# Patient Record
Sex: Male | Born: 1935
Health system: Southern US, Community
[De-identification: ages and names within clinical notes are randomized; demographics above are authoritative.]

## PROBLEM LIST (undated history)

## (undated) DIAGNOSIS — E119 Type 2 diabetes mellitus without complications: Secondary | ICD-10-CM

## (undated) DIAGNOSIS — I509 Heart failure, unspecified: Secondary | ICD-10-CM

## (undated) DIAGNOSIS — N183 Chronic kidney disease, stage 3 unspecified: Secondary | ICD-10-CM

## (undated) DIAGNOSIS — I10 Essential (primary) hypertension: Secondary | ICD-10-CM

## (undated) DIAGNOSIS — E785 Hyperlipidemia, unspecified: Secondary | ICD-10-CM

## (undated) DIAGNOSIS — Z85828 Personal history of other malignant neoplasm of skin: Secondary | ICD-10-CM

## (undated) DIAGNOSIS — I255 Ischemic cardiomyopathy: Secondary | ICD-10-CM

## (undated) DIAGNOSIS — I214 Non-ST elevation (NSTEMI) myocardial infarction: Secondary | ICD-10-CM

## (undated) DIAGNOSIS — I2510 Atherosclerotic heart disease of native coronary artery without angina pectoris: Secondary | ICD-10-CM

## (undated) DIAGNOSIS — C801 Malignant (primary) neoplasm, unspecified: Secondary | ICD-10-CM

## (undated) DIAGNOSIS — N400 Enlarged prostate without lower urinary tract symptoms: Secondary | ICD-10-CM

## (undated) DIAGNOSIS — E782 Mixed hyperlipidemia: Secondary | ICD-10-CM

## (undated) DIAGNOSIS — Z789 Other specified health status: Secondary | ICD-10-CM

## (undated) DIAGNOSIS — R55 Syncope and collapse: Secondary | ICD-10-CM

## (undated) HISTORY — DX: Mixed hyperlipidemia: E78.2

## (undated) HISTORY — DX: Personal history of other malignant neoplasm of skin: Z85.828

## (undated) HISTORY — DX: Essential (primary) hypertension: I10

## (undated) HISTORY — PX: BACK SURGERY: SHX140

## (undated) HISTORY — DX: Type 2 diabetes mellitus without complications: E11.9

## (undated) HISTORY — DX: Other specified health status: Z78.9

## (undated) HISTORY — DX: Syncope and collapse: R55

## (undated) HISTORY — DX: Atherosclerotic heart disease of native coronary artery without angina pectoris: I25.10

## (undated) HISTORY — DX: Non-ST elevation (NSTEMI) myocardial infarction: I21.4

## (undated) HISTORY — DX: Chronic kidney disease, stage 3 unspecified: N18.30

## (undated) HISTORY — DX: Hyperlipidemia, unspecified: E78.5

## (undated) HISTORY — DX: Benign prostatic hyperplasia without lower urinary tract symptoms: N40.0

## (undated) HISTORY — DX: Ischemic cardiomyopathy: I25.5

## (undated) HISTORY — PX: SURGERY OF LIP: SUR1315

## (undated) HISTORY — PX: APPENDECTOMY: SHX54

## (undated) HISTORY — DX: Heart failure, unspecified: I50.9

## (undated) HISTORY — DX: Chronic kidney disease, stage 3 (moderate): N18.3

---

## 2001-11-16 ENCOUNTER — Encounter: Admission: RE | Admit: 2001-11-16 | Discharge: 2001-11-16 | Payer: Self-pay | Admitting: Neurosurgery

## 2001-11-16 ENCOUNTER — Encounter: Payer: Self-pay | Admitting: Neurosurgery

## 2002-02-22 ENCOUNTER — Ambulatory Visit (HOSPITAL_COMMUNITY): Admission: RE | Admit: 2002-02-22 | Discharge: 2002-02-22 | Payer: Self-pay | Admitting: Internal Medicine

## 2002-06-22 ENCOUNTER — Ambulatory Visit (HOSPITAL_COMMUNITY): Admission: RE | Admit: 2002-06-22 | Discharge: 2002-06-22 | Payer: Self-pay | Admitting: Neurosurgery

## 2002-06-22 ENCOUNTER — Encounter: Payer: Self-pay | Admitting: Neurosurgery

## 2002-07-04 ENCOUNTER — Encounter: Payer: Self-pay | Admitting: Unknown Physician Specialty

## 2002-07-04 ENCOUNTER — Ambulatory Visit (HOSPITAL_COMMUNITY): Admission: RE | Admit: 2002-07-04 | Discharge: 2002-07-04 | Payer: Self-pay | Admitting: Unknown Physician Specialty

## 2004-02-23 ENCOUNTER — Ambulatory Visit: Payer: Self-pay | Admitting: Family Medicine

## 2004-03-18 ENCOUNTER — Ambulatory Visit: Payer: Self-pay | Admitting: Family Medicine

## 2004-05-17 ENCOUNTER — Ambulatory Visit (HOSPITAL_COMMUNITY): Admission: RE | Admit: 2004-05-17 | Discharge: 2004-05-17 | Payer: Self-pay | Admitting: Orthopaedic Surgery

## 2004-05-31 ENCOUNTER — Ambulatory Visit: Payer: Self-pay | Admitting: Family Medicine

## 2004-06-08 ENCOUNTER — Ambulatory Visit (HOSPITAL_COMMUNITY): Admission: RE | Admit: 2004-06-08 | Discharge: 2004-06-08 | Payer: Self-pay | Admitting: Orthopaedic Surgery

## 2004-07-19 ENCOUNTER — Ambulatory Visit (HOSPITAL_COMMUNITY): Admission: RE | Admit: 2004-07-19 | Discharge: 2004-07-19 | Payer: Self-pay | Admitting: Otolaryngology

## 2004-08-31 ENCOUNTER — Ambulatory Visit: Payer: Self-pay | Admitting: Family Medicine

## 2010-03-27 ENCOUNTER — Encounter: Payer: Self-pay | Admitting: Orthopaedic Surgery

## 2010-07-23 NOTE — H&P (Signed)
Michael Cobb, Michael Cobb              ACCOUNT NO.:  [id number]   MEDICAL RECORD NO.:  [id number]          PATIENT TYPE:  AMB   LOCATION:                                 FACILITY:   PHYSICIAN:  J. Darreld Mclean, M.D. DATE OF BIRTH:  1934-07-28   DATE OF ADMISSION:  DATE OF DISCHARGE:  LH                                HISTORY & PHYSICAL   CHIEF COMPLAINT:  Right knee is hurting.   HISTORY OF PRESENT ILLNESS:  The patient is a 76 year old male with pain and  tenderness in his right knee.  He has been having pain in his right knee on  and off since the end of November and early December.  I saw him on February 12, 2004 and injected his knee with Depo-Medrol.  He responded well and  improved.  However, he complained of some increased pain and tenderness in  February to his knee.  An MRI was scheduled.  However, the patient was  unable to do that because he was hospitalized at [facility] for acute  dizziness and vertigo.  In the course of the workup there, it was found he  had a severe sinus infection.  He is scheduled for oral surgery on sinus  area in late April.  Meanwhile, after his hospitalization at [facility], we  obtained an MRI of the right knee, which showed a complex tear of the  posterior horn and mid body of the medial meniscus.  Degenerative changes in  the lateral meniscus but no tear.  Intact ligamentous structures.  No joint  effusion.  The patient has tried to get his oral surgeon to have him have  surgery earlier but he cannot.  Because his knee is giving away and he has  had some locking, it is much more painful.  He wants to go ahead and proceed  with surgery on the knee as soon as possible.  The risks and imponderables  have been discussed with the patient.  He appears to understand.   PAST MEDICAL HISTORY:  1.  Kidney disease.  2.  He is a diabetic on insulin.  3.  No history of cancer or history of circulatory problems.   ALLERGIES:  1.  NAPROSYN.  2.   VIOXX.  3.  CELEBREX.  He had a GI bleed with those in the past.   PAST SURGICAL HISTORY:  Back surgery.   FAMILY HISTORY:  He denies any diseases that run in the family.   MEDICATIONS:  Include:  1.  Lipitor 20 mg daily.  2.  Glucophage 500 mg daily.  3.  Glucotrol XL 10 mg daily.  4.  Actos 30 mg daily.  5.  Aspirin 81 mg daily.  6.  Cardura 8 mg tablet 1/2 daily.  7.  Sodium bicarbonate 650 mg daily.  8.  Prevacid 30 mg daily.  9.  He was on Vicodin ES for pain in the knee.   SOCIAL HISTORY:  The patient is married and lives in [place].   PHYSICAL EXAMINATION:  VITAL SIGNS:  Within normal limits.  HEENT:  Negative except  for sinus problems but he his breathing is good.  There is no apparent abnormality.  NECK:  Supple.  LUNGS:  Clear to P&A.  HEART:  Regular without murmurs or rubs.  ABDOMEN:  Soft and nontender without masses.  EXTREMITIES: The right knee has an effusion, pain, and tenderness medially.  The knee is stable.  Extremities within normal limits.  CENTRAL NERVOUS SYSTEM:  Intact.  SKIN:  Intact.   IMPRESSION:  1.  Tear of the medial meniscus of the right knee with degenerative joint      disease.  2.  History of sinus infection with planned surgery in April.  3.  Diabetes mellitus noninsulin-dependent.   PLAN:  Operative arthroscopy of the right knee.  Labs pending.      JWK/MEDQ  D:  06/03/2004  T:  06/03/2004  Job:  [id number]

## 2010-07-23 NOTE — Op Note (Signed)
NAME:  Michael Cobb, Michael Cobb                        ACCOUNT NO.:  [id number]   MEDICAL RECORD NO.:  [id number]                   PATIENT TYPE:  AMB   LOCATION:  DAY                                  FACILITY:  [facility]   PHYSICIAN:  R. Roetta Sessions, M.D.              DATE OF BIRTH:  October 29, 1934   DATE OF PROCEDURE:  02/22/2002  DATE OF DISCHARGE:                                 OPERATIVE REPORT   PROCEDURE:  Colonoscopy with snare polypectomy.   ENDOSCOPIST:  Gerrit Friends. Rourk, M.D.   INDICATIONS FOR PROCEDURE:  The patient is a 76 year old gentleman who had  an adenomatous polyp removed from his proximal descending colon in [facility] 3 years ago.  He is here for follow up examination.  He is devoid  of any lower GI tract symptoms.  This approach has been discussed with the  patient at length.  The potential risks, benefits, and alternatives have  been reviewed.  Please see my handwritten H&P for more information.  ASA 2.   DESCRIPTION OF PROCEDURE:  O2 saturation, blood pressure, pulse and  respirations were monitored throughout the entire procedure.   CONSCIOUS SEDATION:  Demerol 75 mg IV and Versed 3 mg IV.   INSTRUMENT:  Olympus video chip adult colonoscope.   FINDINGS:  Digital rectal exam revealed no abnormalities.   ENDOSCOPIC FINDINGS:  The prep was good.   RECTUM:  Examination of the rectal mucosa including the retroflex view of  the anal verge revealed no abnormalities aside from a 0.75 cm polyp on a  stalk at the rectosigmoid (18 cm).  This was removed and recovered through  the scope.   COLON:  The colonic mucosa was surveyed from the rectosigmoid junction  through the left transverse and right colon to the area of the appendiceal  orifice, ileocecal valve, and cecum.  These structures were well seen and  photographed for the record.  The colonic mucosa all the way to the cecum  appeared normal.   From the level of the cecum and ileocecal valve the scope was  slowly  withdrawn.  All previously mentioned mucosal surfaces were again seen; and  no other abnormalities were observed.  The patient tolerated the procedure  well and was reacted in endoscopy.   IMPRESSION:  1. Polyp at the rectosigmoid 18 cm, removed with snare.  2. The remainder of the rectum and colon appeared normal.    RECOMMENDATIONS:  1. No aspirin or arthritis medications for 10 days.  2. Follow up on path.  3. Further recommendations to follow.                                               Jonathon Bellows, M.D.    RMR/MEDQ  D:  02/22/2002  T:  02/23/2002  Job:  [id number]

## 2010-07-23 NOTE — Op Note (Signed)
NAME:  Michael Cobb, Michael Cobb              ACCOUNT NO.:  [id number]   MEDICAL RECORD NO.:  [id number]          PATIENT TYPE:  AMB   LOCATION:  DAY                           FACILITY:  [facility]   PHYSICIAN:  J. Darreld Mclean, M.D. DATE OF BIRTH:  10-Dec-1934   DATE OF PROCEDURE:  06/08/2004  DATE OF DISCHARGE:                                 OPERATIVE REPORT   PREOPERATIVE DIAGNOSIS:  Tear, medial meniscus, right knee.   POSTOPERATIVE DIAGNOSES:  1.  Tear, medial meniscus, right knee.  2.  Small tear, lateral meniscus.   PROCEDURES:  Operative arthroscopy, partial medial and lateral meniscectomy  of the right knee.   ANESTHESIA:  General.   SURGEON:  J. Darreld Mclean, M.D.   TOURNIQUET TIME:  20 minutes.   No drains.   SPECIMENS:  Meniscus.   The patient went to recovery in good condition.   INDICATIONS:  The patient is a 76 year old male with pain and tenderness in  his right knee.  MRI shows tear of the medial meniscus of the right knee.  He has had increasing pain, locking of the knee, giving away.  Conservative  treatment was unsuccessful.  He desires to have the procedure.  Risks and  imponderables of the procedure discussed preoperatively.   FINDINGS:  The suprapatellar pouch was negative.  There was some grade 2-3  change on the surface of the patella.  Medially there were grade 2-3 changes  of the distal femoral condyle and tibial plateau.  There was a tear,  stellate, of the posterior horn of the medial meniscus with some small loose  particles present.  The anterior cruciate was intact.  Laterally there was a  tear of the lateralmost portion of the meniscus and some small loose  fragments plus fraying.  Anterior cruciate was intact.   DESCRIPTION OF PROCEDURE:  The patient was seen in the holding area.  I  talked with the patient and his family.  The right knee was identified by  the patient as the correct surgical site.  He placed a mark on it, I did  too.  He was taken  back to the operating room.  He was given general  anesthesia and placed supine on the operating room table.  The tourniquet  and leg holder placed deflated on the right upper thigh.  He was prepped and  draped in the usual manner.  We again had a time-out, identifying his right  knee as the correct surgical site, identified the correct patient.  The leg  was then elevated, wrapped circumferentially with an Esmarch bandage, the  tourniquet inflated to 350 mmHg, Esmarch bandage removed.  Inflow cannula  inserted medially, lactated Ringer's instilled in the knee by infusion pump,  while the scope was inserted laterally.  The knee was systematically  examined.  Pertinent pictures were taken.  Please see findings above.  Attention was directed first to the medial meniscal tear.  Using a meniscal  punch and meniscal tear, a good, smooth contour was obtained.  I then went  to the lateral side.  There was a tear there that  was seen examining the  knee, and using the meniscal shaver we removed this and smoothed off the  fraying.  A good, smooth contour was obtained.  The knee was systematically  re-examined and no pathology found.  The wound was reapproximated with 3-0  nylon in interrupted vertical mattress, and Marcaine 0.25% instilled in each  portal.  The tourniquet was deflated after 20 minutes, a sterile dressing  applied and a bulky dressing applied.  A knee immobilizer applied.  The  patient tolerated the procedure well and went to recovery in good condition.  A prescription for Vicodin ES given for pain.  He will see me in the office  in approximately 10 days to two weeks.  Physical therapy has been arranged.  If any difficulties, contact me through the office or the hospital beeper  system.  Numbers have been provided.      JWK/MEDQ  D:  06/08/2004  T:  06/08/2004  Job:  [id number]

## 2017-05-24 ENCOUNTER — Ambulatory Visit: Payer: Medicare Other | Admitting: Cardiovascular Disease

## 2017-05-24 ENCOUNTER — Encounter: Payer: Self-pay | Admitting: Cardiovascular Disease

## 2017-05-24 ENCOUNTER — Encounter: Payer: Self-pay | Admitting: *Deleted

## 2017-05-24 ENCOUNTER — Other Ambulatory Visit: Payer: Self-pay

## 2017-05-24 ENCOUNTER — Telehealth: Payer: Self-pay | Admitting: Cardiovascular Disease

## 2017-05-24 VITALS — BP 190/68 | HR 58 | Ht 71.0 in | Wt 238.0 lb

## 2017-05-24 DIAGNOSIS — I214 Non-ST elevation (NSTEMI) myocardial infarction: Secondary | ICD-10-CM | POA: Diagnosis not present

## 2017-05-24 DIAGNOSIS — N183 Chronic kidney disease, stage 3 unspecified: Secondary | ICD-10-CM

## 2017-05-24 DIAGNOSIS — Z9289 Personal history of other medical treatment: Secondary | ICD-10-CM | POA: Diagnosis not present

## 2017-05-24 DIAGNOSIS — Z789 Other specified health status: Secondary | ICD-10-CM

## 2017-05-24 DIAGNOSIS — R55 Syncope and collapse: Secondary | ICD-10-CM | POA: Diagnosis not present

## 2017-05-24 DIAGNOSIS — I10 Essential (primary) hypertension: Secondary | ICD-10-CM | POA: Diagnosis not present

## 2017-05-24 NOTE — Patient Instructions (Signed)
Your physician recommends that you schedule a follow-up appointment in: [place]  Your physician recommends that you continue on your current medications as directed. Please refer to the Current Medication list given to you today.  Your physician has requested that you have a lexiscan myoview. For further information please visit [email or web address]. Please follow instruction sheet, as given.  Thank you for choosing [facility]!!

## 2017-05-24 NOTE — Progress Notes (Signed)
CARDIOLOGY CONSULT NOTE  Patient ID: [name] MRN: [id number] DOB/AGE: [birth year] 80 y.o.  Admit date: (Not on file) Primary Physician: Neale Burly, MD Referring Physician: Neale Burly, MD  Reason for Consultation: Coronary artery disease  HPI: Michael Cobb is a 83 y.o. male who is being seen today for the evaluation of coronary artery disease at the request of Hasanaj, Samul Dada, MD.  I reviewed extensive documentation from PCP including hospitalization records, labs, and studies.  It appears he was hospitalized for syncope and a non-STEMI in late January 2019.  Past medical history also includes hypertension, type 2 diabetes mellitus, and hyperlipidemia.  Troponin values were as follows: 0.28, 0.36, 0.34.  Other relevant labs include BUN 28, creatinine 1.69, white blood cell 7.4, hemoglobin 11.5, platelets 221.  Chest x-ray showed no active cardiopulmonary disease.  He was apparently treated with aspirin, IV heparin, and nitroglycerin paste.  He was reportedly bradycardic so beta-blockers were not utilized.  Discharge summary mentions [facility] refusing to accept the patient for further workup.  I personally reviewed an ECG which demonstrated sinus rhythm, 61 bpm, with nonspecific T wave abnormalities in lateral leads.  I reviewed an echocardiogram report dated 04/06/17 performed at [facility] which demonstrated normal left ventricular systolic function, LVEF 53-61%, mild LVH, mild aortic valve leaflet thickening, and mild left atrial dilatation.  He tells me he is frustrated about his hospitalization because he was not given any specific information after he was told he had a heart attack.  He denies exertional chest pain, shortness of breath, palpitations, orthopnea, and leg swelling.  He tells me he was watching TV and then stood up and passed out.  He had never passed out prior to that episode and has not passed out since.  He said he hit his head.  He said he  is occasionally dizzy when his blood sugars are low but not otherwise.  He does not know what his HbA1c is.  His blood pressure is elevated today, 190/68, but he said he was very nervous and has not taken his medications yet.  He said it was checked at another provider's office recently and it was 118/60.  Social history: He worked in a [employer] for 38 years.       No Known Allergies  Current Outpatient Medications  Medication Sig Dispense Refill  . allopurinol (ZYLOPRIM) 300 MG tablet Take 300 mg by mouth daily.    Marland Kitchen aspirin EC 81 MG tablet Take 81 mg by mouth daily.    Marland Kitchen DOK 100 MG capsule Take 100 mg by mouth 2 (two) times daily.  0  . furosemide (LASIX) 20 MG tablet Take 20 mg by mouth.    . gabapentin (NEURONTIN) 300 MG capsule Take 300 mg by mouth 3 (three) times daily.  0  . glipiZIDE (GLUCOTROL) 10 MG tablet Take 10 mg by mouth 2 (two) times daily.  0  . HYDROcodone-acetaminophen (NORCO) 7.5-325 MG tablet Take 1 tablet by mouth 3 (three) times daily.  0  . isosorbide mononitrate (IMDUR) 30 MG 24 hr tablet Take 30 mg by mouth daily.  0  . metFORMIN (GLUCOPHAGE) 500 MG tablet TAKE TWO (2) TABLETS BY MOUTH TWICE DAILY  0  . Omega-3 Fatty Acids (FISH OIL PO) Take by mouth.    . potassium citrate (UROCIT-K) 10 MEQ (1080 MG) SR tablet Take 10 mEq by mouth 3 (three) times daily with meals.  11  . ramipril (  ALTACE) 10 MG capsule Take 10 mg by mouth 2 (two) times daily.  0  . tamsulosin (FLOMAX) 0.4 MG CAPS capsule TAKE TWO (2) CAPSULES BY MOUTH DAILY  0   No current facility-administered medications for this visit.     Past Medical History:  Diagnosis Date  . BPH (benign prostatic hyperplasia)   . Chronic renal insufficiency, stage 3 (moderate) (HCC)   . Hyperlipidemia   . Hypertension   . Non-ST elevated myocardial infarction (non-STEMI) ([place])   . Syncope   . Type 2 diabetes mellitus ([place])     Past Surgical History:  Procedure Laterality Date  . APPENDECTOMY    . BACK  SURGERY    . SURGERY OF LIP      Social History   Socioeconomic History  . Marital status: Divorced    Spouse name: Not on file  . Number of children: Not on file  . Years of education: Not on file  . Highest education level: Not on file  Social Needs  . Financial resource strain: Not on file  . Food insecurity - worry: Not on file  . Food insecurity - inability: Not on file  . Transportation needs - medical: Not on file  . Transportation needs - non-medical: Not on file  Occupational History  . Not on file  Tobacco Use  . Smoking status: Never Smoker  . Smokeless tobacco: Never Used  Substance and Sexual Activity  . Alcohol use: Not on file  . Drug use: Not on file  . Sexual activity: Not on file  Other Topics Concern  . Not on file  Social History Narrative  . Not on file     No family history of premature CAD in 1st degree relatives.  Current Meds  Medication Sig  . allopurinol (ZYLOPRIM) 300 MG tablet Take 300 mg by mouth daily.  Marland Kitchen aspirin EC 81 MG tablet Take 81 mg by mouth daily.  Marland Kitchen DOK 100 MG capsule Take 100 mg by mouth 2 (two) times daily.  . furosemide (LASIX) 20 MG tablet Take 20 mg by mouth.  . gabapentin (NEURONTIN) 300 MG capsule Take 300 mg by mouth 3 (three) times daily.  Marland Kitchen glipiZIDE (GLUCOTROL) 10 MG tablet Take 10 mg by mouth 2 (two) times daily.  Marland Kitchen HYDROcodone-acetaminophen (NORCO) 7.5-325 MG tablet Take 1 tablet by mouth 3 (three) times daily.  . isosorbide mononitrate (IMDUR) 30 MG 24 hr tablet Take 30 mg by mouth daily.  . metFORMIN (GLUCOPHAGE) 500 MG tablet TAKE TWO (2) TABLETS BY MOUTH TWICE DAILY  . Omega-3 Fatty Acids (FISH OIL PO) Take by mouth.  . potassium citrate (UROCIT-K) 10 MEQ (1080 MG) SR tablet Take 10 mEq by mouth 3 (three) times daily with meals.  . ramipril (ALTACE) 10 MG capsule Take 10 mg by mouth 2 (two) times daily.  . tamsulosin (FLOMAX) 0.4 MG CAPS capsule TAKE TWO (2) CAPSULES BY MOUTH DAILY      Review of systems  complete and found to be negative unless listed above in HPI    Physical exam Blood pressure (!) 190/68, pulse (!) 58, height 5\' 11"  (1.803 m), weight 238 lb (108 kg), SpO2 98 %. General: NAD Neck: No JVD, no thyromegaly or thyroid nodule.  Lungs: Clear to auscultation bilaterally with normal respiratory effort. CV: Nondisplaced PMI. Regular rate and rhythm, normal S1/S2, no S3/S4, no murmur.  No peripheral edema.  No carotid bruit.   Abdomen: Soft, nontender, no distention.  Skin: Intact without lesions  or rashes.  Neurologic: Alert and oriented x 3.  Psych: Normal affect. Extremities: No clubbing or cyanosis.  HEENT: Normal.   ECG: Most recent ECG reviewed.   Labs: No results found for: K, BUN, CREATININE, ALT, TSH, HGB   Lipids: No results found for: LDLCALC, LDLDIRECT, CHOL, TRIG, HDL      ASSESSMENT AND PLAN:  1.  Syncope with troponin elevation: Unclear etiology at this time.  He was treated for a non-STEMI.  Left ventricular systolic function was normal.  He has had no symptom recurrence. I will proceed with a nuclear myocardial perfusion imaging study to evaluate for ischemic heart disease (Lexiscan Myoview).  2.  Non-STEMI: Troponins were only minimally elevated.  He was started on Imdur but has no history of chest pain whatsoever.  I will discontinue this.  I will continue aspirin.  He is apparently statin intolerant.  He is mildly bradycardic so I am unable to start beta-blockers.  Left ventricular systolic function and regional wall motion were reportedly normal.  3.  Hypertension: Blood pressure is elevated but he has not taken his medications yet today and said he was very nervous.  4.  Chronic kidney disease stage III: BUN and creatinine reviewed above.    Disposition: Follow up in 2 months   Signed: [name], M.D., F.A.C.C.  05/24/2017, 9:01 AM

## 2017-05-24 NOTE — Telephone Encounter (Signed)
Pre-cert Verification for the following procedure   Lexiscan myoview scheduled for 05/31/2017

## 2017-05-31 ENCOUNTER — Encounter (HOSPITAL_COMMUNITY): Payer: Self-pay

## 2017-05-31 ENCOUNTER — Encounter (HOSPITAL_COMMUNITY)
Admission: RE | Admit: 2017-05-31 | Discharge: 2017-05-31 | Disposition: A | Discharge status: Home / Self Care | Payer: Medicare Other | Source: Ambulatory Visit | Attending: Cardiovascular Disease | Admitting: Cardiovascular Disease

## 2017-05-31 ENCOUNTER — Encounter (HOSPITAL_BASED_OUTPATIENT_CLINIC_OR_DEPARTMENT_OTHER)
Admission: RE | Admit: 2017-05-31 | Discharge: 2017-05-31 | Disposition: A | Discharge status: Home / Self Care | Payer: Medicare Other | Source: Ambulatory Visit | Attending: Cardiovascular Disease | Admitting: Cardiovascular Disease

## 2017-05-31 DIAGNOSIS — R55 Syncope and collapse: Secondary | ICD-10-CM

## 2017-05-31 HISTORY — DX: Malignant (primary) neoplasm, unspecified: C80.1

## 2017-05-31 LAB — NM MYOCAR MULTI W/SPECT W/WALL MOTION / EF
LV dias vol: 119 mL (ref 62–150)
LV sys vol: 61 mL
Peak HR: 68 {beats}/min
RATE: 0.37
Rest HR: 55 {beats}/min
SDS: 7
SRS: 2
SSS: 9
TID: 1.02

## 2017-05-31 MED ORDER — REGADENOSON 0.4 MG/5ML IV SOLN
INTRAVENOUS | Status: AC
  Administered 2017-05-31: 0.4 mg via INTRAVENOUS
  Filled 2017-05-31: qty 5

## 2017-05-31 MED ORDER — TECHNETIUM TC 99M TETROFOSMIN IV KIT
10.0000 | PACK | Freq: Once | INTRAVENOUS | Status: AC | PRN
  Administered 2017-05-31: 11 via INTRAVENOUS

## 2017-05-31 MED ORDER — TECHNETIUM TC 99M TETROFOSMIN IV KIT
30.0000 | PACK | Freq: Once | INTRAVENOUS | Status: AC | PRN
  Administered 2017-05-31: 33 via INTRAVENOUS

## 2017-05-31 MED ORDER — SODIUM CHLORIDE 0.9% FLUSH
INTRAVENOUS | Status: AC
  Administered 2017-05-31: 10 mL via INTRAVENOUS
  Filled 2017-05-31: qty 10

## 2017-06-01 ENCOUNTER — Telehealth: Payer: Self-pay | Admitting: *Deleted

## 2017-06-01 NOTE — Telephone Encounter (Signed)
Notes recorded by Laurine Blazer, LPN on 11/27/3005 at 6:22 AM EDT Patient notified. Copy to pmd. Follow up scheduled for 06/20/2017 with Dr. Bronson Ing.   ------  Notes recorded by Laurine Blazer, LPN on 6/33/3545 at 6:25 PM EDT Left message to return call.  ------  Notes recorded by Herminio Commons, MD on 05/31/2017 at 1:06 PM EDT Evidence of prior heart attack and current blockage. Will discuss at office visit. Let's have him return in 3-4 weeks to see me.

## 2017-06-20 ENCOUNTER — Encounter: Payer: Self-pay | Admitting: Cardiovascular Disease

## 2017-06-20 ENCOUNTER — Ambulatory Visit: Payer: Medicare Other | Admitting: Cardiovascular Disease

## 2017-06-20 ENCOUNTER — Other Ambulatory Visit: Payer: Self-pay | Admitting: Cardiovascular Disease

## 2017-06-20 ENCOUNTER — Encounter: Payer: Self-pay | Admitting: *Deleted

## 2017-06-20 ENCOUNTER — Telehealth: Payer: Self-pay | Admitting: Cardiovascular Disease

## 2017-06-20 VITALS — BP 148/72 | HR 69 | Ht 71.0 in | Wt 238.0 lb

## 2017-06-20 DIAGNOSIS — N183 Chronic kidney disease, stage 3 unspecified: Secondary | ICD-10-CM

## 2017-06-20 DIAGNOSIS — Z01812 Encounter for preprocedural laboratory examination: Secondary | ICD-10-CM | POA: Diagnosis not present

## 2017-06-20 DIAGNOSIS — I10 Essential (primary) hypertension: Secondary | ICD-10-CM | POA: Diagnosis not present

## 2017-06-20 DIAGNOSIS — I252 Old myocardial infarction: Secondary | ICD-10-CM

## 2017-06-20 DIAGNOSIS — R55 Syncope and collapse: Secondary | ICD-10-CM

## 2017-06-20 DIAGNOSIS — I25118 Atherosclerotic heart disease of native coronary artery with other forms of angina pectoris: Secondary | ICD-10-CM | POA: Diagnosis not present

## 2017-06-20 LAB — PROTIME-INR

## 2017-06-20 MED ORDER — AMLODIPINE BESYLATE 10 MG PO TABS: 10.0000 mg | ORAL_TABLET | Freq: Every day | ORAL | 6 refills | Status: DC

## 2017-06-20 NOTE — Progress Notes (Signed)
SUBJECTIVE: Patient presents for follow-up after undergoing a nuclear stress test.  It was performed on 05/31/17 and demonstrated a large prior inferior wall myocardial infarction with peri-infarct ischemia.  It was deemed an intermediate risk study.  In summary, he was hospitalized for a non-STEMI in January 2019 at [facility].  Echocardiogram on 04/06/17 performed at [facility] demonstrated normal left ventricular systolic function, LVEF 35-70%, mild LVH, mild aortic valve leaflet thickening, and mild left atrial dilatation.  He said he is an anxious person by nature.  He currently denies chest pain, shortness of breath, and syncope recurrence.  He said he has not seen a nephrologist.  He is due to see his PCP at 10 AM today.  He said he needs pain medications.  He denies palpitations, orthopnea, paroxysmal nocturnal dyspnea.    Review of Systems: As per "subjective", otherwise negative.  No Known Allergies  Current Outpatient Medications  Medication Sig Dispense Refill  . allopurinol (ZYLOPRIM) 300 MG tablet Take 300 mg by mouth daily.    Marland Kitchen amLODipine (NORVASC) 5 MG tablet Take 5 mg by mouth daily.    Marland Kitchen aspirin EC 81 MG tablet Take 81 mg by mouth daily.    Marland Kitchen DOK 100 MG capsule Take 100 mg by mouth 2 (two) times daily.  0  . furosemide (LASIX) 20 MG tablet Take 20 mg by mouth.    Marland Kitchen glipiZIDE (GLUCOTROL) 10 MG tablet Take 10 mg by mouth 2 (two) times daily.  0  . HYDROcodone-acetaminophen (NORCO) 7.5-325 MG tablet Take 1 tablet by mouth 3 (three) times daily.  0  . isosorbide mononitrate (IMDUR) 30 MG 24 hr tablet Take 30 mg by mouth daily.  0  . metFORMIN (GLUCOPHAGE) 500 MG tablet TAKE TWO (2) TABLETS BY MOUTH TWICE DAILY  0  . Omega-3 Fatty Acids (FISH OIL PO) Take by mouth.    . potassium citrate (UROCIT-K) 10 MEQ (1080 MG) SR tablet Take 10 mEq by mouth 3 (three) times daily with meals.  11  . ramipril (ALTACE) 10 MG capsule Take 10 mg by mouth 2 (two) times daily.  0   . rosuvastatin (CRESTOR) 10 MG tablet Take 10 mg by mouth daily.    . tamsulosin (FLOMAX) 0.4 MG CAPS capsule TAKE TWO (2) CAPSULES BY MOUTH DAILY  0   No current facility-administered medications for this visit.     Past Medical History:  Diagnosis Date  . BPH (benign prostatic hyperplasia)   . Cancer (HCC)    Skin  . Chronic renal insufficiency, stage 3 (moderate) (HCC)   . Hyperlipidemia   . Hypertension   . Non-ST elevated myocardial infarction (non-STEMI) ([place])   . Syncope   . Type 2 diabetes mellitus ([place])     Past Surgical History:  Procedure Laterality Date  . APPENDECTOMY    . BACK SURGERY    . SURGERY OF LIP      Social History   Socioeconomic History  . Marital status: Divorced    Spouse name: Not on file  . Number of children: Not on file  . Years of education: Not on file  . Highest education level: Not on file  Occupational History  . Not on file  Social Needs  . Financial resource strain: Not on file  . Food insecurity:    Worry: Not on file    Inability: Not on file  . Transportation needs:    Medical: Not on file    Non-medical:  Not on file  Tobacco Use  . Smoking status: Never Smoker  . Smokeless tobacco: Never Used  Substance and Sexual Activity  . Alcohol use: Not on file  . Drug use: Not on file  . Sexual activity: Not on file  Lifestyle  . Physical activity:    Days per week: Not on file    Minutes per session: Not on file  . Stress: Not on file  Relationships  . Social connections:    Talks on phone: Not on file    Gets together: Not on file    Attends religious service: Not on file    Active member of club or organization: Not on file    Attends meetings of clubs or organizations: Not on file    Relationship status: Not on file  . Intimate partner violence:    Fear of current or ex partner: Not on file    Emotionally abused: Not on file    Physically abused: Not on file    Forced sexual activity: Not on file  Other Topics  Concern  . Not on file  Social History Narrative  . Not on file     Vitals:   06/20/17 0912  BP: (!) 148/72  Pulse: 69  SpO2: 98%  Weight: 238 lb (108 kg)  Height: 5\' 11"  (1.803 m)    Wt Readings from Last 3 Encounters:  06/20/17 238 lb (108 kg)  05/24/17 238 lb (108 kg)     PHYSICAL EXAM General: NAD HEENT: Normal. Neck: No JVD, no thyromegaly. Lungs: Clear to auscultation bilaterally with normal respiratory effort. CV: Regular rate and rhythm, normal S1/S2, no S3/S4, no murmur. No pretibial or periankle edema.   Abdomen: Soft, nontender, no distention.  Neurologic: Alert and oriented.  Psych: Anxious affect. Skin: Normal. Musculoskeletal: No gross deformities.    ECG: Most recent ECG reviewed.   Labs: No results found for: K, BUN, CREATININE, ALT, TSH, HGB   Lipids: No results found for: LDLCALC, LDLDIRECT, CHOL, TRIG, HDL     ASSESSMENT AND PLAN:  1.  Syncope with troponin elevation: Given nuclear stress test results above, syncope may have been due to ischemic heart disease. Left ventricular systolic function was normal.  He has had no symptom recurrence.  I will arrange for left heart catheterization and coronary angiography.  2.    Coronary artery disease with history of relatively recent non-STEMI: Troponins were only minimally elevated.    Abnormal nuclear stress test results above with inferior myocardial infarction and peri-infarct ischemia.   I will arrange for left heart catheterization and coronary angiography. Risks and benefits of cardiac catheterization have been discussed with the patient.  These include bleeding, infection, kidney damage, stroke, heart attack, death.  The patient understands these risks and is willing to proceed.  I will continue aspirin.  He is now on Crestor.  He has recently been mildly bradycardic so I am unable to start beta-blockers.  Heart rate in 60 bpm range today. Left ventricular systolic function and regional wall  motion were reportedly normal.  Creatinine previously 1.69.  I will check a basic metabolic panel today.  3.  Hypertension: Blood pressure is elevated.  I will increase amlodipine to 10 mg.  4.  Chronic kidney disease stage III: I will check a basic metabolic panel today.     Disposition: Follow up 1 month  Time spent: 40 minutes, of which greater than 50% was spent reviewing symptoms, relevant blood tests and studies, and discussing  management plan with the patient.    [name], M.D., F.A.C.C.

## 2017-06-20 NOTE — Telephone Encounter (Signed)
Pre-cert Verification for the following procedure   Left heart cath - Tuesday, 4/23 at 7:30 w/ Dr. Julianne Handler

## 2017-06-20 NOTE — H&P (View-Only) (Signed)
SUBJECTIVE: Patient presents for follow-up after undergoing a nuclear stress test.  It was performed on 05/31/17 and demonstrated a large prior inferior wall myocardial infarction with peri-infarct ischemia.  It was deemed an intermediate risk study.  In summary, he was hospitalized for a non-STEMI in January 2019 at [facility].  Echocardiogram on 04/06/17 performed at [facility] demonstrated normal left ventricular systolic function, LVEF 17-79%, mild LVH, mild aortic valve leaflet thickening, and mild left atrial dilatation.  He said he is an anxious person by nature.  He currently denies chest pain, shortness of breath, and syncope recurrence.  He said he has not seen a nephrologist.  He is due to see his PCP at 10 AM today.  He said he needs pain medications.  He denies palpitations, orthopnea, paroxysmal nocturnal dyspnea.    Review of Systems: As per "subjective", otherwise negative.  No Known Allergies  Current Outpatient Medications  Medication Sig Dispense Refill  . allopurinol (ZYLOPRIM) 300 MG tablet Take 300 mg by mouth daily.    Marland Kitchen amLODipine (NORVASC) 5 MG tablet Take 5 mg by mouth daily.    Marland Kitchen aspirin EC 81 MG tablet Take 81 mg by mouth daily.    Marland Kitchen DOK 100 MG capsule Take 100 mg by mouth 2 (two) times daily.  0  . furosemide (LASIX) 20 MG tablet Take 20 mg by mouth.    Marland Kitchen glipiZIDE (GLUCOTROL) 10 MG tablet Take 10 mg by mouth 2 (two) times daily.  0  . HYDROcodone-acetaminophen (NORCO) 7.5-325 MG tablet Take 1 tablet by mouth 3 (three) times daily.  0  . isosorbide mononitrate (IMDUR) 30 MG 24 hr tablet Take 30 mg by mouth daily.  0  . metFORMIN (GLUCOPHAGE) 500 MG tablet TAKE TWO (2) TABLETS BY MOUTH TWICE DAILY  0  . Omega-3 Fatty Acids (FISH OIL PO) Take by mouth.    . potassium citrate (UROCIT-K) 10 MEQ (1080 MG) SR tablet Take 10 mEq by mouth 3 (three) times daily with meals.  11  . ramipril (ALTACE) 10 MG capsule Take 10 mg by mouth 2 (two) times daily.  0   . rosuvastatin (CRESTOR) 10 MG tablet Take 10 mg by mouth daily.    . tamsulosin (FLOMAX) 0.4 MG CAPS capsule TAKE TWO (2) CAPSULES BY MOUTH DAILY  0   No current facility-administered medications for this visit.     Past Medical History:  Diagnosis Date  . BPH (benign prostatic hyperplasia)   . Cancer (HCC)    Skin  . Chronic renal insufficiency, stage 3 (moderate) (HCC)   . Hyperlipidemia   . Hypertension   . Non-ST elevated myocardial infarction (non-STEMI) ([place])   . Syncope   . Type 2 diabetes mellitus ([place])     Past Surgical History:  Procedure Laterality Date  . APPENDECTOMY    . BACK SURGERY    . SURGERY OF LIP      Social History   Socioeconomic History  . Marital status: Divorced    Spouse name: Not on file  . Number of children: Not on file  . Years of education: Not on file  . Highest education level: Not on file  Occupational History  . Not on file  Social Needs  . Financial resource strain: Not on file  . Food insecurity:    Worry: Not on file    Inability: Not on file  . Transportation needs:    Medical: Not on file    Non-medical:  Not on file  Tobacco Use  . Smoking status: Never Smoker  . Smokeless tobacco: Never Used  Substance and Sexual Activity  . Alcohol use: Not on file  . Drug use: Not on file  . Sexual activity: Not on file  Lifestyle  . Physical activity:    Days per week: Not on file    Minutes per session: Not on file  . Stress: Not on file  Relationships  . Social connections:    Talks on phone: Not on file    Gets together: Not on file    Attends religious service: Not on file    Active member of club or organization: Not on file    Attends meetings of clubs or organizations: Not on file    Relationship status: Not on file  . Intimate partner violence:    Fear of current or ex partner: Not on file    Emotionally abused: Not on file    Physically abused: Not on file    Forced sexual activity: Not on file  Other Topics  Concern  . Not on file  Social History Narrative  . Not on file     Vitals:   06/20/17 0912  BP: (!) 148/72  Pulse: 69  SpO2: 98%  Weight: 238 lb (108 kg)  Height: 5\' 11"  (1.803 m)    Wt Readings from Last 3 Encounters:  06/20/17 238 lb (108 kg)  05/24/17 238 lb (108 kg)     PHYSICAL EXAM General: NAD HEENT: Normal. Neck: No JVD, no thyromegaly. Lungs: Clear to auscultation bilaterally with normal respiratory effort. CV: Regular rate and rhythm, normal S1/S2, no S3/S4, no murmur. No pretibial or periankle edema.   Abdomen: Soft, nontender, no distention.  Neurologic: Alert and oriented.  Psych: Anxious affect. Skin: Normal. Musculoskeletal: No gross deformities.    ECG: Most recent ECG reviewed.   Labs: No results found for: K, BUN, CREATININE, ALT, TSH, HGB   Lipids: No results found for: LDLCALC, LDLDIRECT, CHOL, TRIG, HDL     ASSESSMENT AND PLAN:  1.  Syncope with troponin elevation: Given nuclear stress test results above, syncope may have been due to ischemic heart disease. Left ventricular systolic function was normal.  He has had no symptom recurrence.  I will arrange for left heart catheterization and coronary angiography.  2.    Coronary artery disease with history of relatively recent non-STEMI: Troponins were only minimally elevated.    Abnormal nuclear stress test results above with inferior myocardial infarction and peri-infarct ischemia.   I will arrange for left heart catheterization and coronary angiography. Risks and benefits of cardiac catheterization have been discussed with the patient.  These include bleeding, infection, kidney damage, stroke, heart attack, death.  The patient understands these risks and is willing to proceed.  I will continue aspirin.  He is now on Crestor.  He has recently been mildly bradycardic so I am unable to start beta-blockers.  Heart rate in 60 bpm range today. Left ventricular systolic function and regional wall  motion were reportedly normal.  Creatinine previously 1.69.  I will check a basic metabolic panel today.  3.  Hypertension: Blood pressure is elevated.  I will increase amlodipine to 10 mg.  4.  Chronic kidney disease stage III: I will check a basic metabolic panel today.     Disposition: Follow up 1 month  Time spent: 40 minutes, of which greater than 50% was spent reviewing symptoms, relevant blood tests and studies, and discussing  management plan with the patient.    [name], M.D., F.A.C.C.

## 2017-06-20 NOTE — Patient Instructions (Signed)
Medication Instructions:   Stop Imdur.  Increase Amlodipine to 10mg  daily.   Continue all other medications.    Labwork:  BMET, CBC, PT, PTT - orders given today.  Office will contact with results via phone or letter.    Testing/Procedures: Your physician has requested that you have a cardiac catheterization. Cardiac catheterization is used to diagnose and/or treat various heart conditions. Doctors may recommend this procedure for a number of different reasons. The most common reason is to evaluate chest pain. Chest pain can be a symptom of coronary artery disease (CAD), and cardiac catheterization can show whether plaque is narrowing or blocking your heart's arteries. This procedure is also used to evaluate the valves, as well as measure the blood flow and oxygen levels in different parts of your heart. For further information please visit [email or web address]. Please follow instruction sheet, as given.  Follow-Up: 1 month   Any Other Special Instructions Will Be Listed Below (If Applicable).  If you need a refill on your cardiac medications before your next appointment, please call your pharmacy.

## 2017-06-20 NOTE — Addendum Note (Signed)
Addended by: Laurine Blazer on: 06/20/2017 10:04 AM   Modules accepted: Orders

## 2017-06-26 ENCOUNTER — Telehealth: Payer: Self-pay | Admitting: *Deleted

## 2017-06-26 NOTE — Telephone Encounter (Signed)
Pt contacted pre-catheterization scheduled at [facility] for: Tuesday April 23,2019 3:30 PM Verified arrival time and place: [place] at: 9:30 AM No solid food after midnight, clear liquids until 5 AM. Verified allergies in Epic.   Hold: Metformin day of and 48 hours post procedure. Glipizide (glucatrol) AM of procedure. Ramipril 06/26/17 until post procedure.  Furosemide 06/26/17 until post procedure.  Except hold medications AM meds can be  taken pre-cath with sip of water including: ASA 81 mg  Confirmed patient has responsible person to drive home post procedure and observe patient for 24 hours: yes

## 2017-06-27 ENCOUNTER — Ambulatory Visit (HOSPITAL_COMMUNITY)
Admission: RE | Disposition: A | Discharge status: Home / Self Care | Payer: Self-pay | Source: Ambulatory Visit | Attending: Cardiovascular Disease

## 2017-06-27 ENCOUNTER — Ambulatory Visit (HOSPITAL_COMMUNITY)
Admission: RE | Admit: 2017-06-27 | Discharge: 2017-06-27 | Disposition: A | Discharge status: Home / Self Care | Payer: Medicare Other | Source: Ambulatory Visit | Attending: Cardiovascular Disease | Admitting: Cardiovascular Disease

## 2017-06-27 DIAGNOSIS — Z7984 Long term (current) use of oral hypoglycemic drugs: Secondary | ICD-10-CM | POA: Diagnosis not present

## 2017-06-27 DIAGNOSIS — I252 Old myocardial infarction: Secondary | ICD-10-CM | POA: Diagnosis not present

## 2017-06-27 DIAGNOSIS — I2510 Atherosclerotic heart disease of native coronary artery without angina pectoris: Secondary | ICD-10-CM | POA: Diagnosis not present

## 2017-06-27 DIAGNOSIS — N400 Enlarged prostate without lower urinary tract symptoms: Secondary | ICD-10-CM | POA: Diagnosis not present

## 2017-06-27 DIAGNOSIS — Z79899 Other long term (current) drug therapy: Secondary | ICD-10-CM | POA: Diagnosis not present

## 2017-06-27 DIAGNOSIS — I219 Acute myocardial infarction, unspecified: Secondary | ICD-10-CM | POA: Insufficient documentation

## 2017-06-27 DIAGNOSIS — Z7982 Long term (current) use of aspirin: Secondary | ICD-10-CM | POA: Insufficient documentation

## 2017-06-27 DIAGNOSIS — R001 Bradycardia, unspecified: Secondary | ICD-10-CM | POA: Insufficient documentation

## 2017-06-27 DIAGNOSIS — N183 Chronic kidney disease, stage 3 (moderate): Secondary | ICD-10-CM | POA: Insufficient documentation

## 2017-06-27 DIAGNOSIS — E785 Hyperlipidemia, unspecified: Secondary | ICD-10-CM | POA: Insufficient documentation

## 2017-06-27 DIAGNOSIS — E1122 Type 2 diabetes mellitus with diabetic chronic kidney disease: Secondary | ICD-10-CM | POA: Diagnosis not present

## 2017-06-27 DIAGNOSIS — R9439 Abnormal result of other cardiovascular function study: Secondary | ICD-10-CM | POA: Diagnosis present

## 2017-06-27 DIAGNOSIS — I2584 Coronary atherosclerosis due to calcified coronary lesion: Secondary | ICD-10-CM | POA: Diagnosis not present

## 2017-06-27 DIAGNOSIS — Z85828 Personal history of other malignant neoplasm of skin: Secondary | ICD-10-CM | POA: Insufficient documentation

## 2017-06-27 DIAGNOSIS — I129 Hypertensive chronic kidney disease with stage 1 through stage 4 chronic kidney disease, or unspecified chronic kidney disease: Secondary | ICD-10-CM | POA: Insufficient documentation

## 2017-06-27 HISTORY — PX: LEFT HEART CATH AND CORONARY ANGIOGRAPHY: CATH118249

## 2017-06-27 LAB — BASIC METABOLIC PANEL
Anion gap: 8 (ref 5–15)
BUN: 17 mg/dL (ref 6–20)
CO2: 23 mmol/L (ref 22–32)
Calcium: 8.7 mg/dL — ABNORMAL LOW (ref 8.9–10.3)
Chloride: 106 mmol/L (ref 101–111)
Creatinine, Ser: 1.47 mg/dL — ABNORMAL HIGH (ref 0.61–1.24)
GFR calc Af Amer: 50 mL/min — ABNORMAL LOW (ref >60)
GFR calc non Af Amer: 43 mL/min — ABNORMAL LOW (ref >60)
Glucose, Bld: 234 mg/dL — ABNORMAL HIGH (ref 65–99)
Potassium: 4.4 mmol/L (ref 3.5–5.1)
Sodium: 137 mmol/L (ref 135–145)

## 2017-06-27 LAB — GLUCOSE, CAPILLARY
Glucose-Capillary: 248 mg/dL — ABNORMAL HIGH (ref 65–99)
Glucose-Capillary: 187 mg/dL — ABNORMAL HIGH (ref 65–99)

## 2017-06-27 LAB — POCT ACTIVATED CLOTTING TIME: Activated Clotting Time: 153 seconds

## 2017-06-27 SURGERY — LEFT HEART CATH AND CORONARY ANGIOGRAPHY
Anesthesia: LOCAL

## 2017-06-27 MED ORDER — ONDANSETRON HCL 4 MG/2ML IJ SOLN: 4.0000 mg | Freq: Four times a day (QID) | INTRAMUSCULAR | Status: DC | PRN

## 2017-06-27 MED ORDER — ACETAMINOPHEN 325 MG PO TABS: 650.0000 mg | ORAL_TABLET | ORAL | Status: DC | PRN

## 2017-06-27 MED ORDER — VERAPAMIL HCL 2.5 MG/ML IV SOLN
INTRAVENOUS | Status: AC
  Filled 2017-06-27: qty 2

## 2017-06-27 MED ORDER — HEPARIN (PORCINE) IN NACL 1000-0.9 UT/500ML-% IV SOLN
INTRAVENOUS | Status: AC
  Filled 2017-06-27: qty 1000

## 2017-06-27 MED ORDER — HEPARIN SODIUM (PORCINE) 1000 UNIT/ML IJ SOLN
INTRAMUSCULAR | Status: DC | PRN
  Administered 2017-06-27: 5000 [IU] via INTRAVENOUS

## 2017-06-27 MED ORDER — SODIUM CHLORIDE 0.9 % IV SOLN: 250.0000 mL | INTRAVENOUS | Status: DC | PRN

## 2017-06-27 MED ORDER — SODIUM CHLORIDE 0.9% FLUSH: 3.0000 mL | Freq: Two times a day (BID) | INTRAVENOUS | Status: DC

## 2017-06-27 MED ORDER — LIDOCAINE HCL (PF) 1 % IJ SOLN
INTRAMUSCULAR | Status: DC | PRN
  Administered 2017-06-27: 16 mL
  Administered 2017-06-27: 2 mL

## 2017-06-27 MED ORDER — SODIUM CHLORIDE 0.9% FLUSH: 3.0000 mL | INTRAVENOUS | Status: DC | PRN

## 2017-06-27 MED ORDER — MIDAZOLAM HCL 2 MG/2ML IJ SOLN
INTRAMUSCULAR | Status: AC
  Filled 2017-06-27: qty 2

## 2017-06-27 MED ORDER — ASPIRIN 81 MG PO CHEW
CHEWABLE_TABLET | ORAL | Status: AC
  Filled 2017-06-27: qty 1

## 2017-06-27 MED ORDER — FENTANYL CITRATE (PF) 100 MCG/2ML IJ SOLN
INTRAMUSCULAR | Status: AC
  Filled 2017-06-27: qty 2

## 2017-06-27 MED ORDER — HEPARIN (PORCINE) IN NACL 2-0.9 UNITS/ML
INTRAMUSCULAR | Status: AC | PRN
  Administered 2017-06-27 (×2): 500 mL

## 2017-06-27 MED ORDER — ASPIRIN 81 MG PO CHEW: 81.0000 mg | CHEWABLE_TABLET | ORAL | Status: DC

## 2017-06-27 MED ORDER — HEPARIN SODIUM (PORCINE) 1000 UNIT/ML IJ SOLN
INTRAMUSCULAR | Status: AC
  Filled 2017-06-27: qty 1

## 2017-06-27 MED ORDER — HEPARIN (PORCINE) IN NACL 2-0.9 UNIT/ML-% IJ SOLN
INTRAMUSCULAR | Status: DC | PRN
  Administered 2017-06-27: 10 mL via INTRA_ARTERIAL

## 2017-06-27 MED ORDER — MIDAZOLAM HCL 2 MG/2ML IJ SOLN
INTRAMUSCULAR | Status: DC | PRN
  Administered 2017-06-27 (×2): 1 mg via INTRAVENOUS

## 2017-06-27 MED ORDER — LIDOCAINE HCL (PF) 1 % IJ SOLN
INTRAMUSCULAR | Status: AC
  Filled 2017-06-27: qty 30

## 2017-06-27 MED ORDER — IOHEXOL 350 MG/ML SOLN
INTRAVENOUS | Status: DC | PRN
  Administered 2017-06-27: 85 mL via INTRA_ARTERIAL

## 2017-06-27 MED ORDER — SODIUM CHLORIDE 0.9 % IV SOLN
INTRAVENOUS | Status: DC
  Administered 2017-06-27: 10:00:00 via INTRAVENOUS

## 2017-06-27 MED ORDER — SODIUM CHLORIDE 0.9 % IV SOLN: INTRAVENOUS | Status: DC

## 2017-06-27 MED ORDER — FENTANYL CITRATE (PF) 100 MCG/2ML IJ SOLN
INTRAMUSCULAR | Status: DC | PRN
  Administered 2017-06-27 (×2): 25 ug via INTRAVENOUS

## 2017-06-27 SURGICAL SUPPLY — 16 items
BAND CMPR LRG ZPHR (HEMOSTASIS) ×1
BAND ZEPHYR COMPRESS 30 LONG (HEMOSTASIS) ×1 IMPLANT
CATH EXPO 5F MPA-1 (CATHETERS) ×1 IMPLANT
CATH INFINITI 5 FR 3DRC (CATHETERS) ×1 IMPLANT
CATH INFINITI 5 FR JL3.5 (CATHETERS) ×1 IMPLANT
CATH INFINITI JR4 5F (CATHETERS) ×1 IMPLANT
GUIDEWIRE INQWIRE 1.5J.035X260 (WIRE) IMPLANT
INQWIRE 1.5J .035X260CM (WIRE) ×2
KIT HEART LEFT (KITS) ×2 IMPLANT
NDL PERC 21GX4CM (NEEDLE) IMPLANT
NEEDLE PERC 21GX4CM (NEEDLE) ×2 IMPLANT
PACK CARDIAC CATHETERIZATION (CUSTOM PROCEDURE TRAY) ×2 IMPLANT
SHEATH AVANTI 11CM 5FR (SHEATH) ×1 IMPLANT
SHEATH RAIN RADIAL 21G 6FR (SHEATH) ×1 IMPLANT
TRANSDUCER W/STOPCOCK (MISCELLANEOUS) ×2 IMPLANT
TUBING CIL FLEX 10 FLL-RA (TUBING) ×2 IMPLANT

## 2017-06-27 NOTE — Interval H&P Note (Signed)
History and Physical Interval Note:  06/27/2017 11:20 AM  Michael Cobb  has presented today for cardiac cath with the diagnosis of abnormal stress test. The various methods of treatment have been discussed with the patient and family. After consideration of risks, benefits and other options for treatment, the patient has consented to  Procedure(s): LEFT HEART CATH AND CORONARY ANGIOGRAPHY (N/A) as a surgical intervention .  The patient's history has been reviewed, patient examined, no change in status, stable for surgery.  I have reviewed the patient's chart and labs.  Questions were answered to the patient's satisfaction.    Cath Lab Visit (complete for each Cath Lab visit)  Clinical Evaluation Leading to the Procedure:   ACS: No.  Non-ACS:    Anginal Classification: No Symptoms  Anti-ischemic medical therapy: Minimal Therapy (1 class of medications)  Non-Invasive Test Results: Intermediate-risk stress test findings: cardiac mortality 1-3%/year  Prior CABG: No previous CABG        Michael Cobb

## 2017-06-27 NOTE — Discharge Instructions (Signed)
Hold Metformin for 48 hours post cath.  °Femoral Site Care °Refer to this sheet in the next few weeks. These instructions provide you with information about caring for yourself after your procedure. Your health care provider may also give you more specific instructions. Your treatment has been planned according to current medical practices, but problems sometimes occur. Call your health care provider if you have any problems or questions after your procedure. °What can I expect after the procedure? °After your procedure, it is typical to have the following: °· Bruising at the site that usually fades within 1-2 weeks. °· Blood collecting in the tissue (hematoma) that may be painful to the touch. It should usually decrease in size and tenderness within 1-2 weeks. ° °Follow these instructions at home: °· Take medicines only as directed by your health care provider. °· You may shower 24-48 hours after the procedure or as directed by your health care provider. Remove the bandage (dressing) and gently wash the site with plain soap and water. Pat the area dry with a clean towel. Do not rub the site, because this may cause bleeding. °· Do not take baths, swim, or use a hot tub until your health care provider approves. °· Check your insertion site every day for redness, swelling, or drainage. °· Do not apply powder or lotion to the site. °· Limit use of stairs to twice a day for the first 2-3 days or as directed by your health care provider. °· Do not squat for the first 2-3 days or as directed by your health care provider. °· Do not lift over 10 lb (4.5 kg) for 5 days after your procedure or as directed by your health care provider. °· Ask your health care provider when it is okay to: °? Return to work or school. °? Resume usual physical activities or sports. °? Resume sexual activity. °· Do not drive home if you are discharged the same day as the procedure. Have someone else drive you. °· You may drive 24 hours after the  procedure unless otherwise instructed by your health care provider. °· Do not operate machinery or power tools for 24 hours after the procedure or as directed by your health care provider. °· If your procedure was done as an outpatient procedure, which means that you went home the same day as your procedure, a responsible adult should be with you for the first 24 hours after you arrive home. °· Keep all follow-up visits as directed by your health care provider. This is important. °Contact a health care provider if: °· You have a fever. °· You have chills. °· You have increased bleeding from the site. Hold pressure on the site. °Get help right away if: °· You have unusual pain at the site. °· You have redness, warmth, or swelling at the site. °· You have drainage (other than a small amount of blood on the dressing) from the site. °· The site is bleeding, and the bleeding does not stop after 30 minutes of holding steady pressure on the site. °· Your leg or foot becomes pale, cool, tingly, or numb. °This information is not intended to replace advice given to you by your health care provider. Make sure you discuss any questions you have with your health care provider. °Document Released: 10/25/2013 Document Revised: 07/30/2015 Document Reviewed: 09/10/2013 °Elsevier Interactive Patient Education © 2018 [employer] °Radial Site Care °Refer to this sheet in the next few weeks. These instructions provide you with information about caring   for yourself after your procedure. Your health care provider may also give you more specific instructions. Your treatment has been planned according to current medical practices, but problems sometimes occur. Call your health care provider if you have any problems or questions after your procedure. °What can I expect after the procedure? °After your procedure, it is typical to have the following: °· Bruising at the radial site that usually fades within 1-2 weeks. °· Blood collecting in  the tissue (hematoma) that may be painful to the touch. It should usually decrease in size and tenderness within 1-2 weeks. ° °Follow these instructions at home: °· Take medicines only as directed by your health care provider. °· You may shower 24-48 hours after the procedure or as directed by your health care provider. Remove the bandage (dressing) and gently wash the site with plain soap and water. Pat the area dry with a clean towel. Do not rub the site, because this may cause bleeding. °· Do not take baths, swim, or use a hot tub until your health care provider approves. °· Check your insertion site every day for redness, swelling, or drainage. °· Do not apply powder or lotion to the site. °· Do not flex or bend the affected arm for 24 hours or as directed by your health care provider. °· Do not push or pull heavy objects with the affected arm for 24 hours or as directed by your health care provider. °· Do not lift over 10 lb (4.5 kg) for 5 days after your procedure or as directed by your health care provider. °· Ask your health care provider when it is okay to: °? Return to work or school. °? Resume usual physical activities or sports. °? Resume sexual activity. °· Do not drive home if you are discharged the same day as the procedure. Have someone else drive you. °· You may drive 24 hours after the procedure unless otherwise instructed by your health care provider. °· Do not operate machinery or power tools for 24 hours after the procedure. °· If your procedure was done as an outpatient procedure, which means that you went home the same day as your procedure, a responsible adult should be with you for the first 24 hours after you arrive home. °· Keep all follow-up visits as directed by your health care provider. This is important. °Contact a health care provider if: °· You have a fever. °· You have chills. °· You have increased bleeding from the radial site. Hold pressure on the site. °Get help right away  if: °· You have unusual pain at the radial site. °· You have redness, warmth, or swelling at the radial site. °· You have drainage (other than a small amount of blood on the dressing) from the radial site. °· The radial site is bleeding, and the bleeding does not stop after 30 minutes of holding steady pressure on the site. °· Your arm or hand becomes pale, cool, tingly, or numb. °This information is not intended to replace advice given to you by your health care provider. Make sure you discuss any questions you have with your health care provider. °Document Released: 03/26/2010 Document Revised: 07/30/2015 Document Reviewed: 09/09/2013 °Elsevier Interactive Patient Education © 2018 [employer] Inc[employer] ° °

## 2017-06-27 NOTE — Progress Notes (Addendum)
Right femoral artery sheath removed and pressure held for 20 minutes. Small level 2  hematoma. Dr Julianne Handler aware. Distal right pedal pulse palpable. Bed rest starts at 1300 pm for 4 hours.

## 2017-06-28 ENCOUNTER — Encounter (HOSPITAL_COMMUNITY): Payer: Self-pay | Admitting: Cardiovascular Disease

## 2017-06-28 MED FILL — Heparin Sod (Porcine)-NaCl IV Soln 1000 Unit/500ML-0.9%: INTRAVENOUS | Qty: 1000 | Status: AC

## 2017-07-11 ENCOUNTER — Ambulatory Visit (INDEPENDENT_AMBULATORY_CARE_PROVIDER_SITE_OTHER): Payer: Medicare Other | Admitting: Cardiology

## 2017-07-11 ENCOUNTER — Encounter: Payer: Self-pay | Admitting: Cardiology

## 2017-07-11 VITALS — BP 116/60 | HR 83 | Ht 70.0 in | Wt 232.0 lb

## 2017-07-11 DIAGNOSIS — M79605 Pain in left leg: Secondary | ICD-10-CM

## 2017-07-11 DIAGNOSIS — I2510 Atherosclerotic heart disease of native coronary artery without angina pectoris: Secondary | ICD-10-CM | POA: Diagnosis not present

## 2017-07-11 DIAGNOSIS — I252 Old myocardial infarction: Secondary | ICD-10-CM | POA: Insufficient documentation

## 2017-07-11 DIAGNOSIS — M79604 Pain in right leg: Secondary | ICD-10-CM | POA: Diagnosis not present

## 2017-07-11 NOTE — Progress Notes (Signed)
.hc    07/11/2017 Michael Cobb   [birth year]  [id number]  Primary Physician Sandi Mealy, MD Primary Cardiologist: Dr Bronson Ing  HPI:  83 y/o male followed by Dr Bronson Ing, seen in the office today as a post cath follow up. There pt had a syncopal spell at home and apparently was down for several hours. He was admitted to [facility] and ruled in for a NSTEMI. The pt tells me his legs "gave out" and he couldn't get up. He denies chest pain. Echo showed preserved LVF but a Myoview in March 2019 showed a large inferior scar with intermediate peri infarct ischemia. He was sent for same day cath 06/27/17 which revealed diffuse high grade LAD disease and high grade mRCA disease. He had poor targets and the plan was to treat him medically.   He is in the office today for follow up. He has been up walking, [place]. His main complaint is bilateral leg pain. He says his legs hurt, worse at night. He was changed from Lipitor to Crestor with little improvement. He denies any back pain but admits he has had 5 prior back surgeries. He denies chest pain.    Current Outpatient Medications  Medication Sig Dispense Refill  . allopurinol (ZYLOPRIM) 300 MG tablet Take 300 mg by mouth daily.    Marland Kitchen amLODipine (NORVASC) 10 MG tablet Take 1 tablet (10 mg total) by mouth daily. 30 tablet 6  . aspirin EC 81 MG tablet Take 81 mg by mouth daily.    . cetirizine (ZYRTEC) 10 MG tablet Take 10 mg by mouth 2 (two) times daily.    . Coenzyme Q10 (COQ10) 200 MG CAPS Take 1 capsule by mouth 2 (two) times daily.    Marland Kitchen DOK 100 MG capsule Take 100 mg by mouth 2 (two) times daily.  0  . fluticasone (FLONASE) 50 MCG/ACT nasal spray Place 2 sprays into both nostrils daily as needed for allergies or rhinitis.    . furosemide (LASIX) 20 MG tablet Take 20 mg by mouth daily.     Marland Kitchen gabapentin (NEURONTIN) 300 MG capsule Take 300 mg by mouth 3 (three) times daily as needed (nerve pain).    Marland Kitchen  glipiZIDE (GLUCOTROL) 10 MG tablet Take 10 mg by mouth 2 (two) times daily.  0  . HYDROcodone-acetaminophen (NORCO) 7.5-325 MG tablet Take 1 tablet by mouth 3 (three) times daily.  0  . metFORMIN (GLUCOPHAGE) 500 MG tablet TAKE TWO (2) TABLETS BY MOUTH TWICE DAILY  0  . Omega-3 Fatty Acids (FISH OIL PO) Take 1 tablet by mouth 2 (two) times daily.     . potassium citrate (UROCIT-K) 10 MEQ (1080 MG) SR tablet Take 10 mEq by mouth 3 (three) times daily with meals.  11  . ramipril (ALTACE) 10 MG capsule Take 10 mg by mouth 2 (two) times daily.  0  . tamsulosin (FLOMAX) 0.4 MG CAPS capsule TAKE 0.4mg  CAPSULES BY MOUTH at bedtime  0   No current facility-administered medications for this visit.     Allergies  Allergen Reactions  . Morphine And Related Other (See Comments)    Hallucinate    Past Medical History:  Diagnosis Date  . BPH (benign prostatic hyperplasia)   . Cancer (HCC)    Skin  . Chronic renal insufficiency, stage 3 (moderate) (HCC)   . Hyperlipidemia   . Hypertension   . Non-ST elevated myocardial infarction (non-STEMI) ([place])   . Syncope   .  Type 2 diabetes mellitus ([place])     Social History   Socioeconomic History  . Marital status: Divorced    Spouse name: Not on file  . Number of children: Not on file  . Years of education: Not on file  . Highest education level: Not on file  Occupational History  . Not on file  Social Needs  . Financial resource strain: Not on file  . Food insecurity:    Worry: Not on file    Inability: Not on file  . Transportation needs:    Medical: Not on file    Non-medical: Not on file  Tobacco Use  . Smoking status: Never Smoker  . Smokeless tobacco: Never Used  Substance and Sexual Activity  . Alcohol use: Never    Frequency: Never  . Drug use: Never  . Sexual activity: Not on file  Lifestyle  . Physical activity:    Days per week: Not on file    Minutes per session: Not on file  . Stress: Not on file  Relationships  .  Social connections:    Talks on phone: Not on file    Gets together: Not on file    Attends religious service: Not on file    Active member of club or organization: Not on file    Attends meetings of clubs or organizations: Not on file    Relationship status: Not on file  . Intimate partner violence:    Fear of current or ex partner: Not on file    Emotionally abused: Not on file    Physically abused: Not on file    Forced sexual activity: Not on file  Other Topics Concern  . Not on file  Social History Narrative  . Not on file     Family History  Problem Relation Age of Onset  . Hypertension Father   . CVA Father      Review of Systems: General: negative for chills, fever, night sweats or weight changes.  Cardiovascular: negative for chest pain, dyspnea on exertion, edema, orthopnea, palpitations, paroxysmal nocturnal dyspnea or shortness of breath Dermatological: negative for rash Respiratory: negative for cough or wheezing Urologic: negative for hematuria Abdominal: negative for nausea, vomiting, diarrhea, bright red blood per rectum, melena, or hematemesis Neurologic: negative for visual changes, syncope, or dizziness All other systems reviewed and are otherwise negative except as noted above.    Blood pressure 116/60, pulse 83, height 5\' 10"  (1.778 m), weight 232 lb (105.2 kg), SpO2 96 %.  General appearance: alert, cooperative, appears stated age and no distress Neck: no carotid bruit and no JVD Lungs: clear to auscultation bilaterally Heart: regular rate and rhythm Extremities: varicose veins noted and dependent rubor Pulses: diminnished pulses in both LE Skin: Skin color, texture, turgor normal. No rashes or lesions Neurologic: Grossly normal   ASSESSMENT AND PLAN:   CAD (coronary artery disease) High grade LAD and RCA disease at cath April 2019- medical Rx  History of non-ST elevation myocardial infarction (NSTEMI) Presented with syncope, down at home  for several hours. Ruled in for a NSTEMI.  Leg pain, bilateral Possible secondary to statin Rx, possibly poor arterial circulation, possibly secondary to back DJD.   PLAN  I suggested we obtain LEA dopplers and that he hold his Crestor. He'll need f/u in 4-6 weeks. If his circulation is adequate and his leg pain doesn't improve off Crest then I suspect his symptoms are from his back.  Kerin Ransom PA-C 07/11/2017 1:55 PM

## 2017-07-11 NOTE — Assessment & Plan Note (Signed)
Possible secondary to statin Rx, possibly poor arterial circulation, possibly secondary to back DJD.

## 2017-07-11 NOTE — Assessment & Plan Note (Signed)
Presented with syncope, down at home for several hours. Ruled in for a NSTEMI.

## 2017-07-11 NOTE — Patient Instructions (Addendum)
Your physician wants you to follow-up in: 4-6 weeks  with [facility]   Your physician has requested that you have a lowerextremity arterial duplex. This test is an ultrasound of the arteries in the legs or arms. It looks at arterial blood flow in the legs  Allow one hour for Lower and Upper Arterial scans. There are no restrictions or special instructions     No lab work work today     If you need a refill on your cardiac medications before your next appointment, please call your pharmacy.      Thank you for choosing [facility] !

## 2017-07-11 NOTE — Assessment & Plan Note (Signed)
High grade LAD and RCA disease at cath April 2019- medical Rx

## 2017-07-13 ENCOUNTER — Ambulatory Visit: Payer: Medicare Other | Admitting: Cardiology

## 2017-07-13 ENCOUNTER — Ambulatory Visit (HOSPITAL_COMMUNITY)
Admission: RE | Admit: 2017-07-13 | Discharge: 2017-07-13 | Disposition: A | Discharge status: Home / Self Care | Payer: Medicare Other | Source: Ambulatory Visit | Attending: Cardiology | Admitting: Cardiology

## 2017-07-13 DIAGNOSIS — M79605 Pain in left leg: Secondary | ICD-10-CM | POA: Diagnosis present

## 2017-07-13 DIAGNOSIS — M79604 Pain in right leg: Secondary | ICD-10-CM | POA: Diagnosis present

## 2017-07-14 ENCOUNTER — Ambulatory Visit (HOSPITAL_COMMUNITY): Admission: RE | Admit: 2017-07-14 | Payer: Medicare Other | Source: Ambulatory Visit

## 2017-07-25 ENCOUNTER — Ambulatory Visit: Payer: Medicare Other | Admitting: Physician Assistant

## 2017-07-26 ENCOUNTER — Telehealth: Payer: Self-pay | Admitting: Cardiovascular Disease

## 2017-07-26 MED ORDER — AMLODIPINE BESYLATE 5 MG PO TABS: 5.0000 mg | ORAL_TABLET | Freq: Every day | ORAL | 0 refills | Status: DC

## 2017-07-26 NOTE — Telephone Encounter (Signed)
Patient reports for the past 2 weeks BP has been running low. 105/55-110/60. Daughter has been checking manually. Patient states he has also been very very weak with no energy. Patient reports HR has been in the 70's. No shortness of breath or chest pain. Bp in office today is 175/74 HR 61, but patient reports he has not taken his medication today. Patient understands some of the readings are not too low but was just concerned with him being so weak and exhausted. Will forward to provider. Patient reports we can reach him at [phone] but will not be able to LM

## 2017-07-26 NOTE — Telephone Encounter (Signed)
Patient walk in    Wanting his BP checked due to being low for him since starting medication.   Told me that he would rather not for me to take a message due to his phone not working properly

## 2017-07-26 NOTE — Telephone Encounter (Signed)
Patient notified. Routed to PCP 

## 2017-07-26 NOTE — Telephone Encounter (Signed)
Patient of Dr. Bronson Ing.  He is out of the office and this message was forwarded to my inbox for review.  Patient last saw Dr. Bronson Ing in April, more recently saw Mr. Reino Bellis in May.  I presume that what is being discussed is the recent increase in Norvasc based on review of the notes.  Although systolic blood pressure of 105-110 is not necessarily too low, if he is symptomatic and feels weak, would consider reducing Norvasc back to 5 mg daily and continue to track blood pressure.  Of note, these medication changes were also made in the setting of ischemic heart disease and for antianginal control.  He should keep an eye out for any change in angina symptoms.

## 2017-08-07 ENCOUNTER — Encounter: Payer: Self-pay | Admitting: *Deleted

## 2017-08-07 ENCOUNTER — Ambulatory Visit (INDEPENDENT_AMBULATORY_CARE_PROVIDER_SITE_OTHER): Payer: Medicare Other | Admitting: Cardiovascular Disease

## 2017-08-07 ENCOUNTER — Encounter: Payer: Self-pay | Admitting: Cardiovascular Disease

## 2017-08-07 VITALS — BP 130/68 | HR 66 | Ht 70.0 in | Wt 230.0 lb

## 2017-08-07 DIAGNOSIS — R55 Syncope and collapse: Secondary | ICD-10-CM

## 2017-08-07 DIAGNOSIS — M79605 Pain in left leg: Secondary | ICD-10-CM | POA: Diagnosis not present

## 2017-08-07 DIAGNOSIS — E785 Hyperlipidemia, unspecified: Secondary | ICD-10-CM

## 2017-08-07 DIAGNOSIS — M79604 Pain in right leg: Secondary | ICD-10-CM

## 2017-08-07 DIAGNOSIS — N183 Chronic kidney disease, stage 3 unspecified: Secondary | ICD-10-CM

## 2017-08-07 DIAGNOSIS — R531 Weakness: Secondary | ICD-10-CM

## 2017-08-07 DIAGNOSIS — I25118 Atherosclerotic heart disease of native coronary artery with other forms of angina pectoris: Secondary | ICD-10-CM | POA: Diagnosis not present

## 2017-08-07 DIAGNOSIS — I10 Essential (primary) hypertension: Secondary | ICD-10-CM | POA: Diagnosis not present

## 2017-08-07 MED ORDER — ROSUVASTATIN CALCIUM 5 MG PO TABS: 5.0000 mg | ORAL_TABLET | Freq: Every day | ORAL | Status: DC

## 2017-08-07 NOTE — Patient Instructions (Addendum)
Medication Instructions:   Resume Crestor 5mg  daily.   Continue all other medications.    Labwork: none  Testing/Procedures: none  Follow-Up: 3 months  Any Other Special Instructions Will Be Listed Below (If Applicable). You have been referred to:  Cardiac rehab.  If you need a refill on your cardiac medications before your next appointment, please call your pharmacy.

## 2017-08-07 NOTE — Progress Notes (Signed)
SUBJECTIVE: The patient presents for routine follow-up.  He underwent coronary angiography on 06/27/2017 which demonstrated the following:    Mid RCA lesion is 60% stenosed.  Prox RCA to Mid RCA lesion is 20% stenosed.  Dist RCA lesion is 30% stenosed.  Post Atrio lesion is 30% stenosed.  Mid LM to Ost LAD lesion is 50% stenosed.  Ost LAD to Prox LAD lesion is 20% stenosed.  Ost 1st Diag lesion is 99% stenosed.  Prox LAD lesion is 90% stenosed.  Prox LAD to Mid LAD lesion is 50% stenosed.  Mid LAD lesion is 80% stenosed.  Mid LAD to Dist LAD lesion is 90% stenosed.  Ost 1st Mrg to 1st Mrg lesion is 60% stenosed.  Prox Cx to Mid Cx lesion is 50% stenosed.  Ost 2nd Mrg to 2nd Mrg lesion is 99% stenosed.  Mid Cx to Dist Cx lesion is 99% stenosed.   1. Severe triple vessel CAD.  2. Heavily calcified LAD with severe stenosis extending from the proximal vessel into the distal vessel. Severe stenosis ostial small Diagonal branch.  3. Severe stenosis in the distal Circumflex at bifurcation with a small caliber obtuse marginal branch. The entire Circumflex is heavily calcified. The first OM branch has moderate disease.  4. Moderate stenosis mid RCA  Recommendations: He has severe CAD with heavily calcified vessels. The entire LAD is heavily calcified and diseased. His distal targets are not good for consideration of bypass. I would recommend medical management at this time. He is on an ASA, statin and Norvasc. May add Imdur and a beta blocker if he has chest pain.  He saw Kerin Ransom, PA-C on 07/11/2017.  He held Crestor and obtained ABIs for bilateral leg pain.  He underwent ABIs on 07/13/2017 which showed no evidence of hemodynamically significant lower extremity arterial occlusive disease at rest. It was a limited study secondary to incompressible vessel calcifications.  He denies chest pain, palpitations, shortness of breath.  He complains of generalized weakness.  He  had a fall and was evaluated in the ED at [facility] on [address].  I will have to request these records.  He is scheduled to have an MRI tomorrow.  He did not experience any differences being off of Crestor with respect to bilateral leg pain.  He has had over 5 back surgeries.  He has right knee pain as well.  His son is with him who was the Social worker at [employer].  He questions whether or not his father would be eligible for a drug study with respect to cholesterol.  He also asked about his father participating in a regimented exercise program.  The patient likes to garden but has been unable to do so.  His quality of life has deteriorated.  Review of Systems: As per "subjective", otherwise negative.  Allergies  Allergen Reactions  . Morphine And Related Other (See Comments)    Hallucinate    Current Outpatient Medications  Medication Sig Dispense Refill  . allopurinol (ZYLOPRIM) 300 MG tablet Take 300 mg by mouth daily.    Marland Kitchen amLODipine (NORVASC) 5 MG tablet Take 1 tablet (5 mg total) by mouth daily. 90 tablet 0  . aspirin EC 81 MG tablet Take 81 mg by mouth daily.    . cetirizine (ZYRTEC) 10 MG tablet Take 10 mg by mouth 2 (two) times daily.    . Coenzyme Q10 (COQ10) 200 MG CAPS Take 1 capsule by mouth 2 (two) times daily.    Marland Kitchen  DOK 100 MG capsule Take 100 mg by mouth 2 (two) times daily.  0  . fluticasone (FLONASE) 50 MCG/ACT nasal spray Place 2 sprays into both nostrils daily as needed for allergies or rhinitis.    . furosemide (LASIX) 20 MG tablet Take 20 mg by mouth daily.     Marland Kitchen gabapentin (NEURONTIN) 300 MG capsule Take 300 mg by mouth 3 (three) times daily as needed (nerve pain).    Marland Kitchen glipiZIDE (GLUCOTROL) 10 MG tablet Take 10 mg by mouth 2 (two) times daily.  0  . HYDROcodone-acetaminophen (NORCO) 7.5-325 MG tablet Take 1 tablet by mouth 3 (three) times daily.  0  . magnesium oxide (MAG-OX) 400 MG tablet Take 400 mg by mouth 2 (two) times daily.    .  metFORMIN (GLUCOPHAGE) 500 MG tablet TAKE TWO (2) TABLETS BY MOUTH TWICE DAILY  0  . Omega-3 Fatty Acids (FISH OIL PO) Take 1 tablet by mouth 2 (two) times daily.     . potassium citrate (UROCIT-K) 10 MEQ (1080 MG) SR tablet Take 10 mEq by mouth 3 (three) times daily with meals.  11  . ramipril (ALTACE) 10 MG capsule Take 10 mg by mouth 2 (two) times daily.  0  . tamsulosin (FLOMAX) 0.4 MG CAPS capsule TAKE 0.4mg  CAPSULES BY MOUTH at bedtime  0   No current facility-administered medications for this visit.     Past Medical History:  Diagnosis Date  . BPH (benign prostatic hyperplasia)   . Cancer (HCC)    Skin  . Chronic renal insufficiency, stage 3 (moderate) (HCC)   . Hyperlipidemia   . Hypertension   . Non-ST elevated myocardial infarction (non-STEMI) ([place])   . Syncope   . Type 2 diabetes mellitus ([place])     Past Surgical History:  Procedure Laterality Date  . APPENDECTOMY    . BACK SURGERY    . LEFT HEART CATH AND CORONARY ANGIOGRAPHY N/A 06/27/2017   Procedure: LEFT HEART CATH AND CORONARY ANGIOGRAPHY;  Surgeon: Burnell Blanks, MD;  Location: [facility];  Service: Cardiovascular;  Laterality: N/A;  . SURGERY OF LIP      Social History   Socioeconomic History  . Marital status: Divorced    Spouse name: Not on file  . Number of children: Not on file  . Years of education: Not on file  . Highest education level: Not on file  Occupational History  . Not on file  Social Needs  . Financial resource strain: Not on file  . Food insecurity:    Worry: Not on file    Inability: Not on file  . Transportation needs:    Medical: Not on file    Non-medical: Not on file  Tobacco Use  . Smoking status: Never Smoker  . Smokeless tobacco: Never Used  Substance and Sexual Activity  . Alcohol use: Never    Frequency: Never  . Drug use: Never  . Sexual activity: Not on file  Lifestyle  . Physical activity:    Days per week: Not on file    Minutes per session:  Not on file  . Stress: Not on file  Relationships  . Social connections:    Talks on phone: Not on file    Gets together: Not on file    Attends religious service: Not on file    Active member of club or organization: Not on file    Attends meetings of clubs or organizations: Not on file    Relationship  status: Not on file  . Intimate partner violence:    Fear of current or ex partner: Not on file    Emotionally abused: Not on file    Physically abused: Not on file    Forced sexual activity: Not on file  Other Topics Concern  . Not on file  Social History Narrative  . Not on file     Vitals:   08/07/17 1553  BP: 130/68  Pulse: 66  SpO2: 98%  Weight: 230 lb (104.3 kg)  Height: 5\' 10"  (1.778 m)    Wt Readings from Last 3 Encounters:  08/07/17 230 lb (104.3 kg)  07/11/17 232 lb (105.2 kg)  06/27/17 242 lb (109.8 kg)     PHYSICAL EXAM General: NAD HEENT: Normal. Neck: No JVD, no thyromegaly. Lungs: Clear to auscultation bilaterally with normal respiratory effort. CV: Regular rate and rhythm, normal S1/S2, no S3/S4, no murmur. No pretibial or periankle edema.  Bilateral venous varicosities. Abdomen: Soft, nontender, no distention.  Neurologic: Alert and oriented.  Psych: Normal affect. Skin: Normal. Musculoskeletal: No gross deformities.    ECG: Most recent ECG reviewed.   Labs: Lab Results  Component Value Date/Time   K 4.4 06/27/2017 10:02 AM   BUN 17 06/27/2017 10:02 AM   CREATININE 1.47 (H) 06/27/2017 10:02 AM     Lipids: No results found for: LDLCALC, LDLDIRECT, CHOL, TRIG, HDL     ASSESSMENT AND PLAN: 1.  Severe triple-vessel coronary artery disease with generalized fatigue and weakness: He is not a candidate for CABG and medical management was recommended.  Currently on aspirin and amlodipine.  I will resume Crestor 5 mg has being off of it did not make any significant difference with respect to leg pain.  I will make a referral to cardiac  rehabilitation.  2.  Syncope: No further recurrences.  3.  Hypertension: Blood pressure is normal.  No changes to therapy.  4.  Chronic kidney disease stage III: BUN 17, creatinine 1.47 on 06/27/2017.  5.  Bilateral leg pain: Likely due to spinal disease.  6.  Hyperlipidemia: I will resume Crestor 5 mg.  I will also see if he is eligible for a drug study.   Disposition: Follow up 3 months   [name], M.D., F.A.C.C.

## 2017-08-16 ENCOUNTER — Ambulatory Visit: Payer: Medicare Other | Admitting: Cardiovascular Disease

## 2017-09-08 ENCOUNTER — Inpatient Hospital Stay (HOSPITAL_COMMUNITY): Payer: Medicare Other

## 2017-09-08 ENCOUNTER — Inpatient Hospital Stay (HOSPITAL_COMMUNITY)
Admission: AD | Admit: 2017-09-08 | Discharge: 2017-09-11 | DRG: 871 | Disposition: A | Discharge status: Home / Self Care | Payer: Medicare Other | Source: Other Acute Inpatient Hospital | Attending: Internal Medicine | Admitting: Internal Medicine

## 2017-09-08 DIAGNOSIS — E785 Hyperlipidemia, unspecified: Secondary | ICD-10-CM | POA: Diagnosis present

## 2017-09-08 DIAGNOSIS — A419 Sepsis, unspecified organism: Principal | ICD-10-CM

## 2017-09-08 DIAGNOSIS — I2510 Atherosclerotic heart disease of native coronary artery without angina pectoris: Secondary | ICD-10-CM | POA: Diagnosis present

## 2017-09-08 DIAGNOSIS — J9601 Acute respiratory failure with hypoxia: Secondary | ICD-10-CM | POA: Diagnosis present

## 2017-09-08 DIAGNOSIS — E1165 Type 2 diabetes mellitus with hyperglycemia: Secondary | ICD-10-CM | POA: Diagnosis present

## 2017-09-08 DIAGNOSIS — I252 Old myocardial infarction: Secondary | ICD-10-CM

## 2017-09-08 DIAGNOSIS — Z7982 Long term (current) use of aspirin: Secondary | ICD-10-CM | POA: Diagnosis not present

## 2017-09-08 DIAGNOSIS — R6510 Systemic inflammatory response syndrome (SIRS) of non-infectious origin without acute organ dysfunction: Secondary | ICD-10-CM | POA: Diagnosis present

## 2017-09-08 DIAGNOSIS — Z79899 Other long term (current) drug therapy: Secondary | ICD-10-CM

## 2017-09-08 DIAGNOSIS — N400 Enlarged prostate without lower urinary tract symptoms: Secondary | ICD-10-CM | POA: Diagnosis present

## 2017-09-08 DIAGNOSIS — N183 Chronic kidney disease, stage 3 (moderate): Secondary | ICD-10-CM | POA: Diagnosis present

## 2017-09-08 DIAGNOSIS — I129 Hypertensive chronic kidney disease with stage 1 through stage 4 chronic kidney disease, or unspecified chronic kidney disease: Secondary | ICD-10-CM | POA: Diagnosis present

## 2017-09-08 DIAGNOSIS — E1122 Type 2 diabetes mellitus with diabetic chronic kidney disease: Secondary | ICD-10-CM | POA: Diagnosis present

## 2017-09-08 DIAGNOSIS — Z8249 Family history of ischemic heart disease and other diseases of the circulatory system: Secondary | ICD-10-CM | POA: Diagnosis not present

## 2017-09-08 DIAGNOSIS — I10 Essential (primary) hypertension: Secondary | ICD-10-CM | POA: Diagnosis present

## 2017-09-08 DIAGNOSIS — Z885 Allergy status to narcotic agent status: Secondary | ICD-10-CM | POA: Diagnosis not present

## 2017-09-08 DIAGNOSIS — Z7984 Long term (current) use of oral hypoglycemic drugs: Secondary | ICD-10-CM | POA: Diagnosis not present

## 2017-09-08 DIAGNOSIS — R51 Headache: Secondary | ICD-10-CM | POA: Diagnosis present

## 2017-09-08 DIAGNOSIS — E8770 Fluid overload, unspecified: Secondary | ICD-10-CM | POA: Diagnosis present

## 2017-09-08 DIAGNOSIS — E119 Type 2 diabetes mellitus without complications: Secondary | ICD-10-CM

## 2017-09-08 DIAGNOSIS — M5030 Other cervical disc degeneration, unspecified cervical region: Secondary | ICD-10-CM | POA: Diagnosis present

## 2017-09-08 DIAGNOSIS — R0603 Acute respiratory distress: Secondary | ICD-10-CM

## 2017-09-08 DIAGNOSIS — Z85828 Personal history of other malignant neoplasm of skin: Secondary | ICD-10-CM | POA: Diagnosis not present

## 2017-09-08 DIAGNOSIS — G030 Nonpyogenic meningitis: Secondary | ICD-10-CM | POA: Diagnosis present

## 2017-09-08 DIAGNOSIS — R519 Headache, unspecified: Secondary | ICD-10-CM | POA: Diagnosis present

## 2017-09-08 MED ORDER — SODIUM CHLORIDE 0.9 % IV SOLN
INTRAVENOUS | Status: DC
  Administered 2017-09-09 (×2): via INTRAVENOUS

## 2017-09-08 MED ORDER — PIPERACILLIN-TAZOBACTAM 3.375 G IVPB 30 MIN
3.3750 g | Freq: Once | INTRAVENOUS | Status: AC
  Administered 2017-09-09: 3.375 g via INTRAVENOUS
  Filled 2017-09-08 (×2): qty 50

## 2017-09-08 MED ORDER — ENOXAPARIN SODIUM 40 MG/0.4ML ~~LOC~~ SOLN
40.0000 mg | Freq: Every day | SUBCUTANEOUS | Status: DC
  Administered 2017-09-09 – 2017-09-10 (×3): 40 mg via SUBCUTANEOUS
  Filled 2017-09-08 (×3): qty 0.4

## 2017-09-08 MED ORDER — VANCOMYCIN HCL IN DEXTROSE 1-5 GM/200ML-% IV SOLN
1000.0000 mg | Freq: Once | INTRAVENOUS | Status: AC
  Administered 2017-09-09: 1000 mg via INTRAVENOUS

## 2017-09-08 NOTE — H&P (Signed)
History and Physical    Michael Cobb NTI:[id number] DOB: April 28, 1934 DOA: 09/08/2017  Referring MD/NP/PA: Benson Norway  PCP: Sandi Mealy, MD    Patient coming from: [facility]  Chief Complaint: Fever and headaches  HPI: Michael Cobb is a 83 y.o. male with medical history significant of coronary artery disease, diabetes, hypertension, degenerative disc disease who was transferred from [facility] after he presented this evening with severe headache, fever and chills.  Patient had a temperature of 103.  He has been having symptoms for about 2 days.  He was having chills with the uncontrolled headaches rated as 10 out of 10.  Also neck pain.  Patient's lactate was 4.  Sepsis protocol was initiated after fluid boluses lactate went up to 4.2.  Patient was scheduled for possible lumbar puncture however due to previous back surgery they were unable to do it in the ER.  They have no capacities for lumbar puncture under fluoroscopy.  He was empirically started on IV antibiotics and sent over here for possible LP under fluoroscopy and care of PCCM.  PCCM has reviewed case and recommended stepdown admission under medicine.  Patient is currently better on arrival with a temperature of only 98.  His headache has improved.  No nausea vomiting or diarrhea.  ED Course: Patient was initiated on sepsis protocol in the emergency room over at [facility].  Review of Systems: As per HPI otherwise 10 point review of systems negative.    Past Medical History:  Diagnosis Date  . BPH (benign prostatic hyperplasia)   . Cancer (HCC)    Skin  . Chronic renal insufficiency, stage 3 (moderate) (HCC)   . Hyperlipidemia   . Hypertension   . Non-ST elevated myocardial infarction (non-STEMI) ([place])   . Syncope   . Type 2 diabetes mellitus ([place])     Past Surgical History:  Procedure Laterality Date  . APPENDECTOMY    . BACK SURGERY    . LEFT HEART CATH AND CORONARY ANGIOGRAPHY N/A 06/27/2017   Procedure: LEFT HEART CATH AND CORONARY ANGIOGRAPHY;  Surgeon: Burnell Blanks, MD;  Location: [facility];  Service: Cardiovascular;  Laterality: N/A;  . SURGERY OF LIP       reports that he has never smoked. He has never used smokeless tobacco. He reports that he does not drink alcohol or use drugs.  Allergies  Allergen Reactions  . Morphine And Related Other (See Comments)    Hallucinate    Family History  Problem Relation Age of Onset  . Hypertension Father   . CVA Father     Prior to Admission medications   Medication Sig Start Date End Date Taking? Authorizing Provider  allopurinol (ZYLOPRIM) 300 MG tablet Take 300 mg by mouth daily.    [provider]  amLODipine (NORVASC) 5 MG tablet Take 1 tablet (5 mg total) by mouth daily. 07/26/17 10/24/17  Satira Sark, MD  aspirin EC 81 MG tablet Take 81 mg by mouth daily.    [provider]  cetirizine (ZYRTEC) 10 MG tablet Take 10 mg by mouth 2 (two) times daily.    [provider]  Coenzyme Q10 (COQ10) 200 MG CAPS Take 1 capsule by mouth 2 (two) times daily.    [provider]  DOK 100 MG capsule Take 100 mg by mouth 2 (two) times daily. 03/28/17   [provider]  fluticasone (FLONASE) 50 MCG/ACT nasal spray Place 2 sprays into both nostrils daily as needed  for allergies or rhinitis.    [provider]  furosemide (LASIX) 20 MG tablet Take 20 mg by mouth daily.     [provider]  gabapentin (NEURONTIN) 300 MG capsule Take 300 mg by mouth 3 (three) times daily as needed (nerve pain).    [provider]  glipiZIDE (GLUCOTROL) 10 MG tablet Take 10 mg by mouth 2 (two) times daily. 02/23/17   [provider]  HYDROcodone-acetaminophen (NORCO) 7.5-325 MG tablet Take 1 tablet by mouth 3 (three) times daily. 04/27/17   [provider]  magnesium oxide (MAG-OX) 400 MG tablet Take 400 mg by mouth 2 (two) times daily.    [provider]  metFORMIN (GLUCOPHAGE) 500 MG tablet TAKE TWO (2) TABLETS BY MOUTH TWICE DAILY 05/02/17   [provider]  Omega-3 Fatty Acids (FISH OIL PO) Take 1 tablet by mouth 2 (two) times daily.     [provider]  potassium citrate (UROCIT-K) 10 MEQ (1080 MG) SR tablet Take 10 mEq by mouth 3 (three) times daily with meals. 04/07/17   [provider]  ramipril (ALTACE) 10 MG capsule Take 10 mg by mouth 2 (two) times daily. 04/07/17   [provider]  rosuvastatin (CRESTOR) 5 MG tablet Take 1 tablet (5 mg total) by mouth daily. 08/07/17 11/05/17  Herminio Commons, MD  tamsulosin (FLOMAX) 0.4 MG CAPS capsule TAKE 0.4mg  CAPSULES BY MOUTH at bedtime 05/09/17   [provider]    Physical Exam: There were no vitals filed for this visit.    Constitutional: NAD, calm, comfortable There were no vitals filed for this visit. Eyes: PERRL, lids and conjunctivae normal ENMT: Mucous membranes are moist. Posterior pharynx clear of any exudate or lesions.Normal dentition.  Neck: normal, supple, no masses, no thyromegaly Respiratory: clear to auscultation bilaterally, no wheezing, no crackles. Normal respiratory effort. No accessory muscle use.  Cardiovascular: Regular rate and rhythm, no murmurs / rubs / gallops. No extremity edema. 2+ pedal pulses. No carotid bruits.  Abdomen: no tenderness, no masses palpated. No hepatosplenomegaly. Bowel sounds positive.  Musculoskeletal: no clubbing / cyanosis. No joint deformity upper and lower extremities. Good ROM, no contractures. Normal muscle tone.  Skin: no rashes, lesions, ulcers. No induration Neurologic: CN 2-12 grossly intact. Sensation intact, DTR normal. Strength 5/5 in all 4.  Psychiatric: Normal judgment and insight. Alert and oriented x 3. Normal mood.   Labs on Admission: I have personally reviewed following labs and imaging studies  CBC: No results for input(s): WBC, NEUTROABS, HGB, HCT, MCV, PLT in the  last 168 hours. Basic Metabolic Panel: No results for input(s): NA, K, CL, CO2, GLUCOSE, BUN, CREATININE, CALCIUM, MG, PHOS in the last 168 hours. GFR: CrCl cannot be calculated (Patient's most recent lab result is older than the maximum 21 days allowed.). Liver Function Tests: No results for input(s): AST, ALT, ALKPHOS, BILITOT, PROT, ALBUMIN in the last 168 hours. No results for input(s): LIPASE, AMYLASE in the last 168 hours. No results for input(s): AMMONIA in the last 168 hours. Coagulation Profile: No results for input(s): INR, PROTIME in the last 168 hours. Cardiac Enzymes: No results for input(s): CKTOTAL, CKMB, CKMBINDEX, TROPONINI in the last 168 hours. BNP (last 3 results) No results for input(s): PROBNP in the last 8760 hours. HbA1C: No results for input(s): HGBA1C in the last 72 hours. CBG: No results for input(s): GLUCAP in the last 168 hours. Lipid Profile: No results for input(s): CHOL, HDL, LDLCALC, TRIG, CHOLHDL, LDLDIRECT in  the last 72 hours. Thyroid Function Tests: No results for input(s): TSH, T4TOTAL, FREET4, T3FREE, THYROIDAB in the last 72 hours. Anemia Panel: No results for input(s): VITAMINB12, FOLATE, FERRITIN, TIBC, IRON, RETICCTPCT in the last 72 hours. Urine analysis: No results found for: COLORURINE, APPEARANCEUR, LABSPEC, PHURINE, GLUCOSEU, HGBUR, BILIRUBINUR, KETONESUR, PROTEINUR, UROBILINOGEN, NITRITE, LEUKOCYTESUR Sepsis Labs: @LABRCNTIP (procalcitonin:4,lacticidven:4) )No results found for this or any previous visit (from the past 240 hour(s)).   Radiological Exams on Admission: No results found.   Assessment/Plan Principal Problem:   SIRS (systemic inflammatory response syndrome) (HCC) Active Problems:   Generalized headaches   DDD (degenerative disc disease), cervical   DM2 (diabetes mellitus, type 2) (HCC)   Benign essential HTN    #1 Sirs: Patient appears to have sepsis of unknown origin with his increasing lactate.  Urinalysis is  negative chest x-ray has been negative.  Rapid influenza assay was also negative.  Patient has Lyme disease and [facility] spotted fever pending.  With his headache and high fever and chills there is question of possible meningitis but his mental status is not altered.  He had stiff neck with neck pain however patient has chronic degenerative disc disease of the neck with chronic neck pain which has gotten worse now.  We will recommend continue with the lumbar puncture unless patient clinically improves.  In the meantime empirically start him on vancomycin and cefepime for sepsis protocol.  Await blood cultures as well as repeating chest x-ray possibly in the morning to see if he has early pneumonia.  #2 diabetes: Sliding scale insulin will be initiated with continued home regimen.  #3 coronary artery disease: Patient had previous MI.  No chest pain and no evidence of decompensation  #4 hypertension: Blood pressure appears stable at this point.  Continue home regimen.  #5 severe headaches: Headaches may be related to his current sepsis type symptom.  Could also be due to sinus disease etc.  Head CT without contrast will be required.  We will review if this has been done at [facility]. He is not a candidate for MRI due to the presence of metal in his scalp.   DVT prophylaxis: Lovenox Code Status: Full code Family Communication: Brother and son Disposition Plan: Home Consults called: None at this point Admission status: In Severity of Illness: The appropriate patient status for this patient is INPATIENT. Inpatient status is judged to be reasonable and necessary in order to provide the required intensity of service to ensure the patient's safety. The patient's presenting symptoms, physical exam findings, and initial radiographic and laboratory data in the context of their chronic comorbidities is felt to place them at high risk for further clinical deterioration. Furthermore, it is not anticipated that  the patient will be medically stable for discharge from the hospital within 2 midnights of admission. The following factors support the patient status of inpatient.   " The patient's presenting symptoms include fever and headaches. " The worrisome physical exam findings include temperature of 103 on arrival. " The initial radiographic and laboratory data are worrisome because of lactic acid of 4.2. " The chronic co-morbidities include diabetes with coronary artery disease.   * I certify that at the point of admission it is my clinical judgment that the patient will require inpatient hospital care spanning beyond 2 midnights from the point of admission due to high intensity of service, high risk for further deterioration and high frequency of surveillance required.Barbette Merino MD [facility] Pager [phone]  If 7PM-7AM, please contact night-coverage [email or web address] Password [email or web address]  09/08/2017, 11:19 PM

## 2017-09-08 NOTE — Progress Notes (Signed)
Pt is now present on unit.  [facility] Admissions paged for admission.  Felecia Jan ICU/SD Care Coordinator / Rapid Response Nurse Rapid Response Number:  [phone]

## 2017-09-09 ENCOUNTER — Encounter (HOSPITAL_COMMUNITY): Payer: Self-pay

## 2017-09-09 ENCOUNTER — Inpatient Hospital Stay (HOSPITAL_COMMUNITY): Payer: Medicare Other

## 2017-09-09 ENCOUNTER — Other Ambulatory Visit: Payer: Self-pay

## 2017-09-09 DIAGNOSIS — A419 Sepsis, unspecified organism: Secondary | ICD-10-CM

## 2017-09-09 LAB — GLUCOSE, CAPILLARY
Glucose-Capillary: 220 mg/dL — ABNORMAL HIGH (ref 70–99)
Glucose-Capillary: 204 mg/dL — ABNORMAL HIGH (ref 70–99)
Glucose-Capillary: 150 mg/dL — ABNORMAL HIGH (ref 70–99)
Glucose-Capillary: 210 mg/dL — ABNORMAL HIGH (ref 70–99)

## 2017-09-09 LAB — CBC WITH DIFFERENTIAL/PLATELET
Basophils Absolute: 0 10*3/uL (ref 0.0–0.1)
Basophils Relative: 0 %
Eosinophils Absolute: 0 10*3/uL (ref 0.0–0.7)
Eosinophils Relative: 0 %
HCT: 34.7 % — ABNORMAL LOW (ref 39.0–52.0)
Hemoglobin: 11.7 g/dL — ABNORMAL LOW (ref 13.0–17.0)
Lymphocytes Relative: 6 %
Lymphs Abs: 1 10*3/uL (ref 0.7–4.0)
MCH: 31.5 pg (ref 26.0–34.0)
MCHC: 33.7 g/dL (ref 30.0–36.0)
MCV: 93.5 fL (ref 78.0–100.0)
Monocytes Absolute: 1.2 10*3/uL — ABNORMAL HIGH (ref 0.1–1.0)
Monocytes Relative: 7 %
Neutro Abs: 15.5 10*3/uL — ABNORMAL HIGH (ref 1.7–7.7)
Neutrophils Relative %: 87 %
Platelets: 213 10*3/uL (ref 150–400)
RBC: 3.71 MIL/uL — ABNORMAL LOW (ref 4.22–5.81)
RDW: 14.5 % (ref 11.5–15.5)
WBC: 17.7 10*3/uL — ABNORMAL HIGH (ref 4.0–10.5)

## 2017-09-09 LAB — COMPREHENSIVE METABOLIC PANEL
ALT: 22 U/L (ref 0–44)
ALT: 26 U/L (ref 0–44)
AST: 90 U/L — ABNORMAL HIGH (ref 15–41)
AST: 123 U/L — ABNORMAL HIGH (ref 15–41)
Albumin: 3.2 g/dL — ABNORMAL LOW (ref 3.5–5.0)
Albumin: 3 g/dL — ABNORMAL LOW (ref 3.5–5.0)
Alkaline Phosphatase: 46 U/L (ref 38–126)
Alkaline Phosphatase: 45 U/L (ref 38–126)
Anion gap: 10 (ref 5–15)
Anion gap: 11 (ref 5–15)
BUN: 22 mg/dL (ref 8–23)
BUN: 23 mg/dL (ref 8–23)
CO2: 22 mmol/L (ref 22–32)
CO2: 22 mmol/L (ref 22–32)
Calcium: 8.6 mg/dL — ABNORMAL LOW (ref 8.9–10.3)
Calcium: 8.4 mg/dL — ABNORMAL LOW (ref 8.9–10.3)
Chloride: 107 mmol/L (ref 98–111)
Chloride: 105 mmol/L (ref 98–111)
Creatinine, Ser: 1.5 mg/dL — ABNORMAL HIGH (ref 0.61–1.24)
Creatinine, Ser: 1.48 mg/dL — ABNORMAL HIGH (ref 0.61–1.24)
GFR calc Af Amer: 49 mL/min — ABNORMAL LOW (ref >60)
GFR calc Af Amer: 50 mL/min — ABNORMAL LOW (ref >60)
GFR calc non Af Amer: 42 mL/min — ABNORMAL LOW (ref >60)
GFR calc non Af Amer: 43 mL/min — ABNORMAL LOW (ref >60)
Glucose, Bld: 227 mg/dL — ABNORMAL HIGH (ref 70–99)
Glucose, Bld: 256 mg/dL — ABNORMAL HIGH (ref 70–99)
Potassium: 4.6 mmol/L (ref 3.5–5.1)
Potassium: 4.8 mmol/L (ref 3.5–5.1)
Sodium: 139 mmol/L (ref 135–145)
Sodium: 138 mmol/L (ref 135–145)
Total Bilirubin: 1 mg/dL (ref 0.3–1.2)
Total Bilirubin: 1.6 mg/dL — ABNORMAL HIGH (ref 0.3–1.2)
Total Protein: 6.1 g/dL — ABNORMAL LOW (ref 6.5–8.1)
Total Protein: 5.8 g/dL — ABNORMAL LOW (ref 6.5–8.1)

## 2017-09-09 LAB — CBC
HCT: 32.7 % — ABNORMAL LOW (ref 39.0–52.0)
Hemoglobin: 11.1 g/dL — ABNORMAL LOW (ref 13.0–17.0)
MCH: 31.7 pg (ref 26.0–34.0)
MCHC: 33.9 g/dL (ref 30.0–36.0)
MCV: 93.4 fL (ref 78.0–100.0)
Platelets: 203 10*3/uL (ref 150–400)
RBC: 3.5 MIL/uL — ABNORMAL LOW (ref 4.22–5.81)
RDW: 14.8 % (ref 11.5–15.5)
WBC: 17.6 10*3/uL — ABNORMAL HIGH (ref 4.0–10.5)

## 2017-09-09 LAB — CSF CELL COUNT WITH DIFFERENTIAL
RBC Count, CSF: 8 /mm3 — ABNORMAL HIGH
Tube #: 1
WBC, CSF: 1 /mm3 (ref 0–5)

## 2017-09-09 LAB — LACTATE DEHYDROGENASE: LDH: 225 U/L — ABNORMAL HIGH (ref 98–192)

## 2017-09-09 LAB — PROTIME-INR
INR: 1.06
Prothrombin Time: 13.8 seconds (ref 11.4–15.2)

## 2017-09-09 LAB — LACTIC ACID, PLASMA
Lactic Acid, Venous: 2.2 mmol/L (ref 0.5–1.9)
Lactic Acid, Venous: 1.7 mmol/L (ref 0.5–1.9)

## 2017-09-09 LAB — PROCALCITONIN: Procalcitonin: 9.41 ng/mL

## 2017-09-09 LAB — APTT: aPTT: 24 seconds (ref 24–36)

## 2017-09-09 LAB — MRSA PCR SCREENING: MRSA by PCR: NEGATIVE

## 2017-09-09 MED ORDER — TAMSULOSIN HCL 0.4 MG PO CAPS
0.4000 mg | ORAL_CAPSULE | Freq: Every day | ORAL | Status: DC
  Administered 2017-09-09 – 2017-09-11 (×3): 0.4 mg via ORAL
  Filled 2017-09-09 (×3): qty 1

## 2017-09-09 MED ORDER — GABAPENTIN 300 MG PO CAPS: 300.0000 mg | ORAL_CAPSULE | Freq: Three times a day (TID) | ORAL | Status: DC | PRN

## 2017-09-09 MED ORDER — RAMIPRIL 10 MG PO CAPS: 10.0000 mg | ORAL_CAPSULE | Freq: Two times a day (BID) | ORAL | Status: DC

## 2017-09-09 MED ORDER — ROSUVASTATIN CALCIUM 5 MG PO TABS
5.0000 mg | ORAL_TABLET | Freq: Every day | ORAL | Status: DC
  Administered 2017-09-09 – 2017-09-11 (×3): 5 mg via ORAL
  Filled 2017-09-09 (×3): qty 1

## 2017-09-09 MED ORDER — PIPERACILLIN-TAZOBACTAM 3.375 G IVPB
3.3750 g | Freq: Three times a day (TID) | INTRAVENOUS | Status: DC
  Administered 2017-09-09: 3.375 g via INTRAVENOUS

## 2017-09-09 MED ORDER — SODIUM CHLORIDE 0.9 % IV SOLN
2.0000 g | Freq: Four times a day (QID) | INTRAVENOUS | Status: DC
  Filled 2017-09-09 (×2): qty 2000

## 2017-09-09 MED ORDER — SODIUM CHLORIDE 0.9 % IV SOLN
2.0000 g | Freq: Two times a day (BID) | INTRAVENOUS | Status: DC
  Administered 2017-09-09: 2 g via INTRAVENOUS
  Filled 2017-09-09: qty 20
  Filled 2017-09-09: qty 2

## 2017-09-09 MED ORDER — SODIUM CHLORIDE 0.9 % IV SOLN
2.0000 g | INTRAVENOUS | Status: DC
  Administered 2017-09-09: 2 g via INTRAVENOUS
  Filled 2017-09-09 (×3): qty 2000
  Filled 2017-09-09: qty 2

## 2017-09-09 MED ORDER — ACETAMINOPHEN 325 MG PO TABS
650.0000 mg | ORAL_TABLET | Freq: Four times a day (QID) | ORAL | Status: DC | PRN
  Administered 2017-09-09 – 2017-09-10 (×3): 650 mg via ORAL
  Filled 2017-09-09 (×3): qty 2

## 2017-09-09 MED ORDER — INSULIN ASPART 100 UNIT/ML ~~LOC~~ SOLN
0.0000 [IU] | SUBCUTANEOUS | Status: DC
  Administered 2017-09-09 (×2): 3 [IU] via SUBCUTANEOUS

## 2017-09-09 MED ORDER — INSULIN ASPART 100 UNIT/ML ~~LOC~~ SOLN
0.0000 [IU] | Freq: Three times a day (TID) | SUBCUTANEOUS | Status: DC
  Administered 2017-09-09: 1 [IU] via SUBCUTANEOUS
  Administered 2017-09-10: 5 [IU] via SUBCUTANEOUS
  Administered 2017-09-10: 2 [IU] via SUBCUTANEOUS
  Administered 2017-09-10: 7 [IU] via SUBCUTANEOUS
  Administered 2017-09-11: 2 [IU] via SUBCUTANEOUS

## 2017-09-09 MED ORDER — ALLOPURINOL 100 MG PO TABS: 300.0000 mg | ORAL_TABLET | Freq: Every day | ORAL | Status: DC

## 2017-09-09 MED ORDER — LIDOCAINE HCL 1 % IJ SOLN
INTRAMUSCULAR | Status: AC
  Administered 2017-09-09: 12:00:00
  Filled 2017-09-09: qty 20

## 2017-09-09 MED ORDER — AMLODIPINE BESYLATE 5 MG PO TABS
5.0000 mg | ORAL_TABLET | Freq: Every day | ORAL | Status: DC
  Administered 2017-09-09 – 2017-09-11 (×3): 5 mg via ORAL
  Filled 2017-09-09 (×3): qty 1

## 2017-09-09 MED ORDER — SODIUM CHLORIDE 0.9 % IV SOLN
1500.0000 mg | INTRAVENOUS | Status: DC
  Administered 2017-09-09: 1500 mg via INTRAVENOUS
  Filled 2017-09-09: qty 1500

## 2017-09-09 MED ORDER — AMPICILLIN SODIUM 2 G IJ SOLR: 2.0000 g | Freq: Four times a day (QID) | INTRAMUSCULAR | Status: DC

## 2017-09-09 NOTE — Progress Notes (Signed)
Heart rate dropped down to 41, lasted approximately 30 seconds, heart rate rose back to 63. EKG obtained, MD notified. Pt states that he feels fine, was resting in bed with eyes open when bradycardia occurred. Will continue to monitor closely at this time.

## 2017-09-09 NOTE — Progress Notes (Signed)
PROGRESS NOTE    Michael Cobb  ZSW:[id number] DOB: [birth year] DOA: 09/08/2017 PCP: Sandi Mealy, MD     Brief Narrative:  Michael Cobb is a 83 yo male with medical history significant of coronary artery disease, diabetes, hypertension, degenerative disc disease who was transferred from [facility] after he presented this evening with severe headache, fever and chills.  Patient had a temperature of 103.  He has been having symptoms for about 2 days.  He was having chills with the uncontrolled headaches rated as 10 out of 10.  Also neck pain.  Patient's lactate was 4.  Sepsis protocol was initiated after fluid boluses lactate went up to 4.2.  Patient was scheduled for possible lumbar puncture however due to previous back surgery they were unable to do it in the ER.  They have no capacities for lumbar puncture under fluoroscopy.  He was empirically started on IV antibiotics and sent over here for possible LP under fluoroscopy and care of PCCM.  PCCM has reviewed case and recommended stepdown admission under medicine.   Reports of imaging from [facility] as below:  CT head without contrast 7/5: No acute intracranial abnormality. Stable noncontrast CT appearance of the brain with mild for age nonspecific white matter changes.   CT abd/pelvis without contrast 7/5: No acute abnormality of the abdomen or pelvis. Chronic slight prominence of the distal esophageal mucosa. This could represent esophagitis. Small bilateral renal calculi, less numerous than on the prior study.   CXR 7/5: No acute disease  New events last 24 hours / Subjective: No acute events. Headache has resolved. He continues to have weakness but overall feeling better.   Assessment & Plan:   Principal Problem:   Sepsis ([place]) Active Problems:   CAD (coronary artery disease)   Generalized headaches   DDD (degenerative disc disease), cervical   DM2 (diabetes mellitus, type 2) (HCC)   Benign essential  HTN   Sepsis secondary to suspected meningitis -Change antibiotics to vanco/rocephin/ampicillin -Blood cultures pending  -IR for fluoro-guided LP ordered -Droplet precautions until rule out meningitis  -IVF   CKD stage 3  -Baseline Cr 1.5  -Stable    HTN -Continue norvasc. Hold ACE due to impaired renal function   DM with hyperglycemia  -Hold metformin, glipizide -SSI  -Neurontin for neuropathic pain   HLD -Continue crestor  CAD -Hold aspirin until after LP complete     DVT prophylaxis: Lovenox Code Status: Full Family Communication: At bedside Disposition Plan: Pending LP and work up    Consultants:   None  Procedures:   None   Antimicrobials:  Anti-infectives (From admission, onward)   Start     Dose/Rate Route Frequency Ordered Stop   09/09/17 1200  vancomycin (VANCOCIN) 1,500 mg in sodium chloride 0.9 % 500 mL IVPB     1,500 mg 250 mL/hr over 120 Minutes Intravenous Every 36 hours 09/09/17 0354     09/09/17 0800  cefTRIAXone (ROCEPHIN) 2 g in sodium chloride 0.9 % 100 mL IVPB     2 g 200 mL/hr over 30 Minutes Intravenous Every 12 hours 09/09/17 0726     09/09/17 0800  ampicillin (OMNIPEN) 2 g in sodium chloride 0.9 % 100 mL IVPB     2 g 300 mL/hr over 20 Minutes Intravenous Every 4 hours 09/09/17 0727     09/09/17 0730  ampicillin (OMNIPEN) 2 g in sodium chloride 0.9 % 100 mL IVPB  Status:  Discontinued     2 g  300 mL/hr over 20 Minutes Intravenous Every 6 hours 09/09/17 0726 09/09/17 0727   09/09/17 0600  piperacillin-tazobactam (ZOSYN) IVPB 3.375 g  Status:  Discontinued     3.375 g 12.5 mL/hr over 240 Minutes Intravenous Every 8 hours 09/09/17 0354 09/09/17 0726   09/08/17 2330  piperacillin-tazobactam (ZOSYN) IVPB 3.375 g     3.375 g 100 mL/hr over 30 Minutes Intravenous  Once 09/08/17 2319 09/09/17 0047   09/08/17 2330  vancomycin (VANCOCIN) IVPB 1000 mg/200 mL premix     1,000 mg 200 mL/hr over 60 Minutes Intravenous  Once 09/08/17 2319  09/09/17 0204        Objective: Vitals:   09/08/17 2300 09/09/17 0334 09/09/17 0400 09/09/17 0600  BP: 115/64  123/63 (!) 143/70  Pulse: 80  64 66  Resp: 20  18 17   Temp: 98.2 F (36.8 C) 98.2 F (36.8 C)    TempSrc: Oral Oral    SpO2: 97%  97% 97%  Weight: 103.2 kg (227 lb 8.2 oz)     Height: 5\' 10"  (1.778 m)       Intake/Output Summary (Last 24 hours) at 09/09/2017 0804 Last data filed at 09/09/2017 0400 Gross per 24 hour  Intake 371.67 ml  Output 250 ml  Net 121.67 ml   Filed Weights   09/08/17 2300  Weight: 103.2 kg (227 lb 8.2 oz)    Examination:  General exam: Appears calm and comfortable  Respiratory system: Clear to auscultation. Respiratory effort normal. Cardiovascular system: S1 & S2 heard, RRR. No JVD, murmurs, rubs, gallops or clicks. No pedal edema. Gastrointestinal system: Abdomen is nondistended, soft and nontender. No organomegaly or masses felt. Normal bowel sounds heard. Central nervous system: Alert and oriented. No focal neurological deficits. CN 2-12 grossly intact.  Extremities: Symmetric  Skin: No rashes, lesions or ulcers Psychiatry: Judgement and insight appear normal. Mood & affect appropriate.   Data Reviewed: I have personally reviewed following labs and imaging studies  CBC: Recent Labs  Lab 09/08/17 2350 09/09/17 0342  WBC 17.7* 17.6*  NEUTROABS 15.5*  --   HGB 11.7* 11.1*  HCT 34.7* 32.7*  MCV 93.5 93.4  PLT 213 381   Basic Metabolic Panel: Recent Labs  Lab 09/08/17 2350 09/09/17 0342  NA 139 138  K 4.6 4.8  CL 107 105  CO2 22 22  GLUCOSE 227* 256*  BUN 22 23  CREATININE 1.50* 1.48*  CALCIUM 8.6* 8.4*   GFR: Estimated Creatinine Clearance: 47.9 mL/min (A) (by C-G formula based on SCr of 1.48 mg/dL (H)). Liver Function Tests: Recent Labs  Lab 09/08/17 2350 09/09/17 0342  AST 90* 123*  ALT 22 26  ALKPHOS 46 45  BILITOT 1.0 1.6*  PROT 6.1* 5.8*  ALBUMIN 3.2* 3.0*   No results for input(s): LIPASE, AMYLASE in  the last 168 hours. No results for input(s): AMMONIA in the last 168 hours. Coagulation Profile: Recent Labs  Lab 09/08/17 2350  INR 1.06   Cardiac Enzymes: No results for input(s): CKTOTAL, CKMB, CKMBINDEX, TROPONINI in the last 168 hours. BNP (last 3 results) No results for input(s): PROBNP in the last 8760 hours. HbA1C: No results for input(s): HGBA1C in the last 72 hours. CBG: No results for input(s): GLUCAP in the last 168 hours. Lipid Profile: No results for input(s): CHOL, HDL, LDLCALC, TRIG, CHOLHDL, LDLDIRECT in the last 72 hours. Thyroid Function Tests: No results for input(s): TSH, T4TOTAL, FREET4, T3FREE, THYROIDAB in the last 72 hours. Anemia Panel: No results for input(s):  VITAMINB12, FOLATE, FERRITIN, TIBC, IRON, RETICCTPCT in the last 72 hours. Sepsis Labs: Recent Labs  Lab 09/08/17 2350 09/09/17 0229  PROCALCITON 9.41  --   LATICACIDVEN 2.2* 1.7    Recent Results (from the past 240 hour(s))  MRSA PCR Screening     Status: None   Collection Time: 09/08/17 11:03 PM  Result Value Ref Range Status   MRSA by PCR NEGATIVE NEGATIVE Final    Comment:        The GeneXpert MRSA Assay (FDA approved for NASAL specimens only), is one component of a comprehensive MRSA colonization surveillance program. It is not intended to diagnose MRSA infection nor to guide or monitor treatment for MRSA infections. Performed at [facility] [place] [address] [place], [place] [address]        Radiology Studies: Dg Chest Port 1 View  Result Date: 09/08/2017 CLINICAL DATA:  83 year old male with sepsis. EXAM: PORTABLE CHEST 1 VIEW COMPARISON:  09/08/2017 CT and 04/04/2017 radiographs FINDINGS: The cardiomediastinal silhouette is unremarkable. Mild RIGHT basilar atelectasis/scarring is unchanged. There is no evidence of focal airspace disease, pulmonary edema, suspicious pulmonary nodule/mass, pleural effusion, or pneumothorax. No acute bony abnormalities are  identified. IMPRESSION: No acute abnormality. Electronically Signed   By: Margarette Canada M.D.   On: 09/08/2017 23:36      Scheduled Meds: . amLODipine  5 mg Oral Daily  . enoxaparin (LOVENOX) injection  40 mg Subcutaneous QHS  . rosuvastatin  5 mg Oral Daily  . tamsulosin  0.4 mg Oral Daily   Continuous Infusions: . sodium chloride 100 mL/hr at 09/09/17 0017  . ampicillin (OMNIPEN) IV    . cefTRIAXone (ROCEPHIN)  IV    . vancomycin       LOS: 1 day    Time spent: 40 minutes   Michael Phi, DO [facility] [email or web address] Password [email or web address] 09/09/2017, 8:04 AM

## 2017-09-09 NOTE — Progress Notes (Signed)
Pt had seven-beat run of vtach. Resting in bed when it occurred. MD notified. Will continue to monitor closely at this time. +

## 2017-09-09 NOTE — Progress Notes (Signed)
PHARMACY NOTE:  ANTIMICROBIAL RENAL DOSAGE ADJUSTMENT  Current antimicrobial regimen includes a mismatch between antimicrobial dosage and estimated renal function.  As per policy approved by the Pharmacy & Therapeutics and Medical Executive Committees, the antimicrobial dosage will be adjusted accordingly.  Current antimicrobial dosage:  Ampicillin 2g IV q4  Indication: rule out meningitis  Renal Function:  Estimated Creatinine Clearance: 47.9 mL/min (A) (by C-G formula based on SCr of 1.48 mg/dL (H)). []      On intermittent HD, scheduled: []      On CRRT    Antimicrobial dosage has been changed to:  Ampicillin 2g IV q6  Additional comments:   Thank you for allowing pharmacy to be a part of this patient's care.  Kara Mead, [facility] 09/09/2017 12:36 PM

## 2017-09-09 NOTE — Procedures (Addendum)
   Procedure: Fluoro LP.  ~10-12cc clear CSF acquired in 4 tubes.  Opening pressure was  ~4-5 H20 .  Complications: None  Recommendations:  Routine wound care Follow up labs

## 2017-09-09 NOTE — Progress Notes (Signed)
Pharmacy Antibiotic Note  Michael Cobb is a 83 y.o. male admitted on 09/08/2017 with sepsis.  Pharmacy has been consulted for Vancomycin and Zosyn dosing.  Plan: Zosyn 3.375g IV q8h (4 hour infusion).   Vancomycin 1gm iv x1, then 1500mg  iv q36hr  Goal AUC = 400 - 500 for all indications, except meningitis (goal AUC > 500 and Cmin 15-20 mcg/mL)   Height: 5\' 10"  (177.8 cm) Weight: 227 lb 8.2 oz (103.2 kg) IBW/kg (Calculated) : 73  Temp (24hrs), Avg:98.2 F (36.8 C), Min:98.2 F (36.8 C), Max:98.2 F (36.8 C)  Recent Labs  Lab 09/08/17 2350 09/09/17 0229  WBC 17.7*  --   CREATININE 1.50*  --   LATICACIDVEN 2.2* 1.7    Estimated Creatinine Clearance: 47.3 mL/min (A) (by C-G formula based on SCr of 1.5 mg/dL (H)).    Allergies  Allergen Reactions  . Morphine And Related Other (See Comments)    Hallucinate    Antimicrobials this admission: Vancomycin 09/09/2017 >> Zosyn 09/09/2017 >>   Dose adjustments this admission: -  Microbiology results: -  Thank you for allowing pharmacy to be a part of this patient's care.  Nani Skillern Crowford 09/09/2017 3:55 AM

## 2017-09-09 NOTE — Progress Notes (Signed)
Report called to Danielle, RN

## 2017-09-10 ENCOUNTER — Inpatient Hospital Stay (HOSPITAL_COMMUNITY): Payer: Medicare Other

## 2017-09-10 LAB — BASIC METABOLIC PANEL
Anion gap: 7 (ref 5–15)
BUN: 26 mg/dL — ABNORMAL HIGH (ref 8–23)
CO2: 25 mmol/L (ref 22–32)
Calcium: 8.2 mg/dL — ABNORMAL LOW (ref 8.9–10.3)
Chloride: 109 mmol/L (ref 98–111)
Creatinine, Ser: 1.43 mg/dL — ABNORMAL HIGH (ref 0.61–1.24)
GFR calc Af Amer: 52 mL/min — ABNORMAL LOW (ref >60)
GFR calc non Af Amer: 45 mL/min — ABNORMAL LOW (ref >60)
Glucose, Bld: 171 mg/dL — ABNORMAL HIGH (ref 70–99)
Potassium: 4 mmol/L (ref 3.5–5.1)
Sodium: 141 mmol/L (ref 135–145)

## 2017-09-10 LAB — CBC
HCT: 31.4 % — ABNORMAL LOW (ref 39.0–52.0)
Hemoglobin: 10.4 g/dL — ABNORMAL LOW (ref 13.0–17.0)
MCH: 31.3 pg (ref 26.0–34.0)
MCHC: 33.1 g/dL (ref 30.0–36.0)
MCV: 94.6 fL (ref 78.0–100.0)
Platelets: 186 10*3/uL (ref 150–400)
RBC: 3.32 MIL/uL — ABNORMAL LOW (ref 4.22–5.81)
RDW: 14.9 % (ref 11.5–15.5)
WBC: 12.9 10*3/uL — ABNORMAL HIGH (ref 4.0–10.5)

## 2017-09-10 LAB — GLUCOSE, CAPILLARY
Glucose-Capillary: 167 mg/dL — ABNORMAL HIGH (ref 70–99)
Glucose-Capillary: 333 mg/dL — ABNORMAL HIGH (ref 70–99)
Glucose-Capillary: 285 mg/dL — ABNORMAL HIGH (ref 70–99)
Glucose-Capillary: 280 mg/dL — ABNORMAL HIGH (ref 70–99)

## 2017-09-10 MED ORDER — DOCUSATE SODIUM 100 MG PO CAPS
100.0000 mg | ORAL_CAPSULE | Freq: Two times a day (BID) | ORAL | Status: DC
  Administered 2017-09-10 – 2017-09-11 (×3): 100 mg via ORAL
  Filled 2017-09-10 (×3): qty 1

## 2017-09-10 MED ORDER — ASPIRIN EC 81 MG PO TBEC
81.0000 mg | DELAYED_RELEASE_TABLET | Freq: Every day | ORAL | Status: DC
  Administered 2017-09-10: 81 mg via ORAL
  Filled 2017-09-10: qty 1

## 2017-09-10 MED ORDER — DULOXETINE HCL 20 MG PO CPEP
20.0000 mg | ORAL_CAPSULE | Freq: Every day | ORAL | Status: DC
  Administered 2017-09-10 – 2017-09-11 (×2): 20 mg via ORAL
  Filled 2017-09-10 (×2): qty 1

## 2017-09-10 MED ORDER — ALBUTEROL SULFATE (2.5 MG/3ML) 0.083% IN NEBU
INHALATION_SOLUTION | RESPIRATORY_TRACT | Status: AC
  Filled 2017-09-10: qty 3

## 2017-09-10 MED ORDER — ALBUTEROL SULFATE (2.5 MG/3ML) 0.083% IN NEBU
2.5000 mg | INHALATION_SOLUTION | RESPIRATORY_TRACT | Status: DC | PRN
  Administered 2017-09-10: 2.5 mg via RESPIRATORY_TRACT

## 2017-09-10 MED ORDER — TAMSULOSIN HCL 0.4 MG PO CAPS: 0.4000 mg | ORAL_CAPSULE | Freq: Every day | ORAL | Status: DC

## 2017-09-10 MED ORDER — IPRATROPIUM-ALBUTEROL 0.5-2.5 (3) MG/3ML IN SOLN
3.0000 mL | Freq: Four times a day (QID) | RESPIRATORY_TRACT | Status: DC
  Administered 2017-09-10 (×2): 3 mL via RESPIRATORY_TRACT
  Filled 2017-09-10 (×2): qty 3

## 2017-09-10 MED ORDER — FUROSEMIDE 10 MG/ML IJ SOLN
INTRAMUSCULAR | Status: AC
  Filled 2017-09-10: qty 4

## 2017-09-10 MED ORDER — IPRATROPIUM-ALBUTEROL 0.5-2.5 (3) MG/3ML IN SOLN: 3.0000 mL | Freq: Four times a day (QID) | RESPIRATORY_TRACT | Status: DC | PRN

## 2017-09-10 MED ORDER — FUROSEMIDE 10 MG/ML IJ SOLN
40.0000 mg | Freq: Once | INTRAMUSCULAR | Status: AC
  Administered 2017-09-10: 40 mg via INTRAVENOUS

## 2017-09-10 NOTE — Progress Notes (Signed)
OT Cancellation Note  Patient Details Name: Michael Cobb MRN: [id number] DOB: 12/28/1934   Cancelled Treatment:    Reason Eval/Treat Not Completed: Medical issues which prohibited therapy. Pt is having difficulty breathing per RN. Will check back tomorrow.  Mar Zettler 09/10/2017, 8:02 AM  Lesle Chris, OTR/L [phone] 09/10/2017

## 2017-09-10 NOTE — Plan of Care (Signed)
  Problem: Education: Goal: Knowledge of General Education information will improve Outcome: Progressing   Problem: Health Behavior/Discharge Planning: Goal: Ability to manage health-related needs will improve Outcome: Progressing   Problem: Clinical Measurements: Goal: Ability to maintain clinical measurements within normal limits will improve Outcome: Progressing Goal: Respiratory complications will improve Outcome: Progressing   Problem: Activity: Goal: Risk for activity intolerance will decrease Outcome: Progressing   Problem: Coping: Goal: Level of anxiety will decrease Outcome: Progressing   Problem: Clinical Measurements: Goal: Will remain free from infection Outcome: Adequate for Discharge Goal: Diagnostic test results will improve Outcome: Adequate for Discharge Goal: Cardiovascular complication will be avoided Outcome: Adequate for Discharge   Problem: Nutrition: Goal: Adequate nutrition will be maintained Outcome: Adequate for Discharge   Problem: Elimination: Goal: Will not experience complications related to bowel motility Outcome: Adequate for Discharge Goal: Will not experience complications related to urinary retention Outcome: Adequate for Discharge   Problem: Pain Managment: Goal: General experience of comfort will improve Outcome: Adequate for Discharge

## 2017-09-10 NOTE — Progress Notes (Signed)
RN called into room. PT complaining of worsening SOB. PT had WOB and auditory expiratory wheezes. MD was notified, fluids were stopped. State chest xray, lasix and breathing tx ordered. Pt currently 64 0n 4 L [place]. Pt resting comfortable.

## 2017-09-10 NOTE — Progress Notes (Signed)
PROGRESS NOTE    DENT PLANTZ  NLG:[id number] DOB: 01-20-1935 DOA: 09/08/2017 PCP: Sandi Mealy, MD     Brief Narrative:  Michael Cobb is a 83 yo male with medical history significant of coronary artery disease, diabetes, hypertension, degenerative disc disease who was transferred from [facility] after he presented this evening with severe headache, fever and chills.  Patient had a temperature of 103.  He has been having symptoms for about 2 days.  He was having chills with the uncontrolled headaches rated as 10 out of 10.  Also neck pain.  Patient's lactate was 4.  Sepsis protocol was initiated after fluid boluses lactate went up to 4.2.  Patient was scheduled for possible lumbar puncture however due to previous back surgery they were unable to do it in the ER.  They have no capacities for lumbar puncture under fluoroscopy.  He was empirically started on IV antibiotics and sent over here for possible LP under fluoroscopy and care of PCCM.  PCCM has reviewed case and recommended stepdown admission under medicine.   Reports of imaging from [facility] as below:  CT head without contrast 7/5: No acute intracranial abnormality. Stable noncontrast CT appearance of the brain with mild for age nonspecific white matter changes.   CT abd/pelvis without contrast 7/5: No acute abnormality of the abdomen or pelvis. Chronic slight prominence of the distal esophageal mucosa. This could represent esophagitis. Small bilateral renal calculi, less numerous than on the prior study.   CXR 7/5: No acute disease  New events last 24 hours / Subjective: Called by RN this morning regarding patient in respiratory distress.  He was requiring 5 L nasal cannula O2 and was maintaining SPO2 87%.  Patient was evaluated, he was tachypneic on exam, stated that he had shortness of breath without any chest pain, felt abdominal distention.  Chest x-ray obtained, patient's IV fluids discontinued and given IV Lasix.    Patient was reevaluated later this morning. He had great improvement in his respiratory status.  Appears comfortable on nasal cannula O2.  Assessment & Plan:   Principal Problem:   Sepsis ([place]) Active Problems:   CAD (coronary artery disease)   Generalized headaches   DDD (degenerative disc disease), cervical   DM2 (diabetes mellitus, type 2) (HCC)   Benign essential HTN   Sepsis secondary to suspected aseptic meningitis -LP 7/6 showed WBC 1 and gram stain negative. Antibiotics stopped -Blood cultures pending  -Leukocytosis improving. Unclear etiology of patient's leukocytosis and sepsis, could be aseptic meningitis.  Continue supportive care.  Acute hypoxemic respiratory failure  -Likely due to fluid overload in setting of iatrogenic IVF -Stop IVF, CXR obtained this morning which was personally reviewed, revealed some pulmonary vascular congestion compared to previous CXR. IV lasix given with improvement  CKD stage 3  -Baseline Cr 1.5  -Stable  HTN -Continue norvasc. Hold ACE due to impaired renal function   DM with hyperglycemia  -Hold metformin, glipizide -SSI  -Neurontin for neuropathic pain   HLD -Continue crestor  CAD -Resume aspirin     DVT prophylaxis: Lovenox Code Status: Full Family Communication: No family at bedside  Disposition Plan: PT OT to evaluate   Consultants:   None  Procedures:   None   Antimicrobials:  Anti-infectives (From admission, onward)   Start     Dose/Rate Route Frequency Ordered Stop   09/09/17 1600  ampicillin (OMNIPEN) 2 g in sodium chloride 0.9 % 100 mL IVPB  Status:  Discontinued  2 g 300 mL/hr over 20 Minutes Intravenous Every 6 hours 09/09/17 1237 09/09/17 1547   09/09/17 1200  vancomycin (VANCOCIN) 1,500 mg in sodium chloride 0.9 % 500 mL IVPB  Status:  Discontinued     1,500 mg 250 mL/hr over 120 Minutes Intravenous Every 36 hours 09/09/17 0354 09/09/17 1547   09/09/17 0800  cefTRIAXone (ROCEPHIN) 2 g in  sodium chloride 0.9 % 100 mL IVPB  Status:  Discontinued     2 g 200 mL/hr over 30 Minutes Intravenous Every 12 hours 09/09/17 0726 09/09/17 1547   09/09/17 0800  ampicillin (OMNIPEN) 2 g in sodium chloride 0.9 % 100 mL IVPB  Status:  Discontinued     2 g 300 mL/hr over 20 Minutes Intravenous Every 4 hours 09/09/17 0727 09/09/17 1237   09/09/17 0730  ampicillin (OMNIPEN) 2 g in sodium chloride 0.9 % 100 mL IVPB  Status:  Discontinued     2 g 300 mL/hr over 20 Minutes Intravenous Every 6 hours 09/09/17 0726 09/09/17 0727   09/09/17 0600  piperacillin-tazobactam (ZOSYN) IVPB 3.375 g  Status:  Discontinued     3.375 g 12.5 mL/hr over 240 Minutes Intravenous Every 8 hours 09/09/17 0354 09/09/17 0726   09/08/17 2330  piperacillin-tazobactam (ZOSYN) IVPB 3.375 g     3.375 g 100 mL/hr over 30 Minutes Intravenous  Once 09/08/17 2319 09/09/17 0047   09/08/17 2330  vancomycin (VANCOCIN) IVPB 1000 mg/200 mL premix     1,000 mg 200 mL/hr over 60 Minutes Intravenous  Once 09/08/17 2319 09/09/17 0204       Objective: Vitals:   09/09/17 1957 09/09/17 2043 09/10/17 0509 09/10/17 0807  BP:  (!) 151/76 (!) 154/73 (!) 163/72  Pulse:  68 64 78  Resp:  18 16 (!) 24  Temp: 98.2 F (36.8 C) 98.6 F (37 C) 98.3 F (36.8 C)   TempSrc: Oral Oral Oral   SpO2:  98% 91% 95%  Weight:      Height:        Intake/Output Summary (Last 24 hours) at 09/10/2017 1053 Last data filed at 09/10/2017 1045 Gross per 24 hour  Intake 2020.83 ml  Output 2026 ml  Net -5.17 ml   Filed Weights   09/08/17 2300  Weight: 103.2 kg (227 lb 8.2 oz)    Examination: General exam: Appears tachypneic and anxious  Respiratory system: Diminished without wheeze. Respiratory effort increased. On [place] O2 Cardiovascular system: S1 & S2 heard, RRR. No JVD, murmurs, rubs, gallops or clicks. No pedal edema. Gastrointestinal system: Abdomen is nondistended, soft and nontender. No organomegaly or masses felt. Normal bowel sounds  heard. Central nervous system: Alert and oriented. No focal neurological deficits. Extremities: Symmetric 5 x 5 power. Skin: No rashes, lesions or ulcers Psychiatry: Judgement and insight appear normal. Mood & affect appropriate.    Data Reviewed: I have personally reviewed following labs and imaging studies  CBC: Recent Labs  Lab 09/08/17 2350 09/09/17 0342 09/10/17 0353  WBC 17.7* 17.6* 12.9*  NEUTROABS 15.5*  --   --   HGB 11.7* 11.1* 10.4*  HCT 34.7* 32.7* 31.4*  MCV 93.5 93.4 94.6  PLT 213 203 400   Basic Metabolic Panel: Recent Labs  Lab 09/08/17 2350 09/09/17 0342 09/10/17 0353  NA 139 138 141  K 4.6 4.8 4.0  CL 107 105 109  CO2 22 22 25   GLUCOSE 227* 256* 171*  BUN 22 23 26*  CREATININE 1.50* 1.48* 1.43*  CALCIUM 8.6* 8.4* 8.2*  GFR: Estimated Creatinine Clearance: 49.6 mL/min (A) (by C-G formula based on SCr of 1.43 mg/dL (H)). Liver Function Tests: Recent Labs  Lab 09/08/17 2350 09/09/17 0342  AST 90* 123*  ALT 22 26  ALKPHOS 46 45  BILITOT 1.0 1.6*  PROT 6.1* 5.8*  ALBUMIN 3.2* 3.0*   No results for input(s): LIPASE, AMYLASE in the last 168 hours. No results for input(s): AMMONIA in the last 168 hours. Coagulation Profile: Recent Labs  Lab 09/08/17 2350  INR 1.06   Cardiac Enzymes: No results for input(s): CKTOTAL, CKMB, CKMBINDEX, TROPONINI in the last 168 hours. BNP (last 3 results) No results for input(s): PROBNP in the last 8760 hours. HbA1C: No results for input(s): HGBA1C in the last 72 hours. CBG: Recent Labs  Lab 09/09/17 0839 09/09/17 1200 09/09/17 1607 09/09/17 2038 09/10/17 0803  GLUCAP 220* 204* 150* 210* 167*   Lipid Profile: No results for input(s): CHOL, HDL, LDLCALC, TRIG, CHOLHDL, LDLDIRECT in the last 72 hours. Thyroid Function Tests: No results for input(s): TSH, T4TOTAL, FREET4, T3FREE, THYROIDAB in the last 72 hours. Anemia Panel: No results for input(s): VITAMINB12, FOLATE, FERRITIN, TIBC, IRON,  RETICCTPCT in the last 72 hours. Sepsis Labs: Recent Labs  Lab 09/08/17 2350 09/09/17 0229  PROCALCITON 9.41  --   LATICACIDVEN 2.2* 1.7    Recent Results (from the past 240 hour(s))  MRSA PCR Screening     Status: None   Collection Time: 09/08/17 11:03 PM  Result Value Ref Range Status   MRSA by PCR NEGATIVE NEGATIVE Final    Comment:        The GeneXpert MRSA Assay (FDA approved for NASAL specimens only), is one component of a comprehensive MRSA colonization surveillance program. It is not intended to diagnose MRSA infection nor to guide or monitor treatment for MRSA infections. Performed at [facility], [place] [address], [place], [place] [address]   CSF culture     Status: None (Preliminary result)   Collection Time: 09/09/17 12:30 PM  Result Value Ref Range Status   Specimen Description   Final    LUMBAR PUNCTURE Performed at [facility] [address] [place], [place] [address]    Special Requests   Final    NONE Performed at [facility], [place] [address], [place], [place] [address]    Gram Stain   Final    NO ORGANISMS SEEN NO WBC SEEN Gram Stain Report Called to,Read Back By and Verified With: YWVP,X AT 1500 ON [id number] BY HOOKER,B CYTOSPIN SMEAR    Culture   Final    NO GROWTH < 24 HOURS Performed at [facility], [address] [place], [place] [address]    Report Status PENDING  Incomplete       Radiology Studies: Dg Chest Port 1 View  Result Date: 09/10/2017 CLINICAL DATA:  Respiratory distress EXAM: PORTABLE CHEST 1 VIEW COMPARISON:  September 08, 2017 FINDINGS: There is atelectatic change in each lower lung zone with small right pleural effusion. There is no edema or consolidation. Heart is borderline prominent with pulmonary vascularity normal. No adenopathy. There is aortic atherosclerosis. IMPRESSION: Stable lower lobe atelectasis bilaterally with small right pleural effusion. No edema or  consolidation. Stable cardiac silhouette. There is aortic atherosclerosis. Aortic Atherosclerosis (ICD10-I70.0). Electronically Signed   By: Lowella Grip III M.D.   On: 09/10/2017 08:03   Dg Chest Port 1 View  Result Date: 09/08/2017 CLINICAL DATA:  83 year old male with sepsis. EXAM:  PORTABLE CHEST 1 VIEW COMPARISON:  09/08/2017 CT and 04/04/2017 radiographs FINDINGS: The cardiomediastinal silhouette is unremarkable. Mild RIGHT basilar atelectasis/scarring is unchanged. There is no evidence of focal airspace disease, pulmonary edema, suspicious pulmonary nodule/mass, pleural effusion, or pneumothorax. No acute bony abnormalities are identified. IMPRESSION: No acute abnormality. Electronically Signed   By: Margarette Canada M.D.   On: 09/08/2017 23:36   Dg Fluoro Guide Lumbar Puncture  Result Date: 09/09/2017 CLINICAL DATA:  83 year old male with concern for meningitis EXAM: DIAGNOSTIC LUMBAR PUNCTURE UNDER FLUOROSCOPIC GUIDANCE FLUOROSCOPY TIME:  Fluoroscopy Time:  6 seconds PROCEDURE: Informed consent was obtained from the patient prior to the procedure, including potential complications of headache, allergy, and pain. With the patient prone, the lower back was prepped with Betadine. 1% Lidocaine was used for local anesthesia. Lumbar puncture was performed at the L4 level using a 3.5 inch 20 gauge needle with return of clear CSF with an opening pressure of 5 cm water. 11-12 ml of CSF were obtained for laboratory studies. The patient tolerated the procedure well and there were no apparent complications. IMPRESSION: Status post fluoro guided lumbar puncture. Electronically Signed   By: Corrie Mckusick D.O.   On: 09/09/2017 13:07      Scheduled Meds: . albuterol      . amLODipine  5 mg Oral Daily  . enoxaparin (LOVENOX) injection  40 mg Subcutaneous QHS  . furosemide      . insulin aspart  0-9 Units Subcutaneous TID WC  . ipratropium-albuterol  3 mL Nebulization Q6H  . rosuvastatin  5 mg Oral Daily  .  tamsulosin  0.4 mg Oral Daily   Continuous Infusions:    LOS: 2 days    Time spent: 40 minutes   Dessa Phi, DO [facility] [email or web address] Password [email or web address] 09/10/2017, 10:53 AM

## 2017-09-11 LAB — BASIC METABOLIC PANEL
Anion gap: 9 (ref 5–15)
BUN: 21 mg/dL (ref 8–23)
CO2: 26 mmol/L (ref 22–32)
Calcium: 8.4 mg/dL — ABNORMAL LOW (ref 8.9–10.3)
Chloride: 106 mmol/L (ref 98–111)
Creatinine, Ser: 1.31 mg/dL — ABNORMAL HIGH (ref 0.61–1.24)
GFR calc Af Amer: 58 mL/min — ABNORMAL LOW (ref >60)
GFR calc non Af Amer: 50 mL/min — ABNORMAL LOW (ref >60)
Glucose, Bld: 179 mg/dL — ABNORMAL HIGH (ref 70–99)
Potassium: 3.8 mmol/L (ref 3.5–5.1)
Sodium: 141 mmol/L (ref 135–145)

## 2017-09-11 LAB — CBC
HCT: 32.5 % — ABNORMAL LOW (ref 39.0–52.0)
Hemoglobin: 10.8 g/dL — ABNORMAL LOW (ref 13.0–17.0)
MCH: 30.9 pg (ref 26.0–34.0)
MCHC: 33.2 g/dL (ref 30.0–36.0)
MCV: 93.1 fL (ref 78.0–100.0)
Platelets: 195 10*3/uL (ref 150–400)
RBC: 3.49 MIL/uL — ABNORMAL LOW (ref 4.22–5.81)
RDW: 14.5 % (ref 11.5–15.5)
WBC: 8.4 10*3/uL (ref 4.0–10.5)

## 2017-09-11 LAB — GLUCOSE, CAPILLARY: Glucose-Capillary: 161 mg/dL — ABNORMAL HIGH (ref 70–99)

## 2017-09-11 LAB — VDRL, CSF: VDRL Quant, CSF: NONREACTIVE

## 2017-09-11 MED ORDER — FUROSEMIDE 20 MG PO TABS
20.0000 mg | ORAL_TABLET | Freq: Once | ORAL | Status: AC
  Administered 2017-09-11: 20 mg via ORAL
  Filled 2017-09-11: qty 1

## 2017-09-11 NOTE — Progress Notes (Signed)
Discharge instructions discussed with patient and family, verbalized agreement and understanding 

## 2017-09-11 NOTE — Evaluation (Signed)
Physical Therapy Evaluation Patient Details Name: Michael Cobb MRN: [id number] DOB: 1934/06/12 Today's Date: 09/11/2017   History of Present Illness  83 yo male admitted with sepsis, suspected meningitis. Developed acute respiratory failure during stay. LP on 7/6. Hx of CAD, NTSTEMI, DDD, DM, skin cancer  Clinical Impression  On eval, pt was Min guard assist for mobility. He walked ~200 feet and climbed 2 steps with a straight cane. O2 95% on RA, HR 85 bpm. Pt tolerated activity well. Noted some mild instability but no overt LOB. Pt is eager to d/c on today. He stated he has plenty of help available from family/friends if needed. Will recommend at least 1-2 visits from [place] to ensure safe transition back into home environment, if pt is agreeable. Will follow during hospital stay.     Follow Up Recommendations Home health PT(if pt is agreeable. He may decline it. )    Equipment Recommendations  None recommended by PT    Recommendations for Other Services       Precautions / Restrictions Precautions Precautions: Fall Restrictions Weight Bearing Restrictions: No      Mobility  Bed Mobility               General bed mobility comments: sitting EOB   Transfers Overall transfer level: Needs assistance   Transfers: Sit to/from Stand Sit to Stand: Supervision         General transfer comment: for safety.   Ambulation/Gait Ambulation/Gait assistance: Min guard Gait Distance (Feet): 200 Feet Assistive device: Straight cane Gait Pattern/deviations: Step-through pattern;Decreased stride length     General Gait Details: close guard for safety. mild instability noted intermittently. Pt tolerated distance well. O2 95% on RA, HR 85 bpm. Pt denied dizziness/lightheadedness.   Stairs Stairs: Yes Stairs assistance: Min guard Stair Management: With cane;One rail Right Number of Stairs: 2 General stair comments: close guard for safety.   Wheelchair Mobility    Modified  [place] (Stroke Patients Only)       Balance Overall balance assessment: Mild deficits observed, not formally tested                                           Pertinent Vitals/Pain Pain Assessment: No/denies pain    Home Living Family/patient expects to be discharged to:: Private residence Living Arrangements: Non-relatives/Friends Data processing manager is available during the day) Available Help at Discharge: Available PRN/intermittently(Bill) Type of Home: House Home Access: Stairs to enter Entrance Stairs-Rails: None Entrance Stairs-Number of Steps: 2 Home Layout: One level Home Equipment: Environmental consultant - 2 wheels;Cane - single point      Prior Function Level of Independence: Independent with assistive device(s)         Comments: using cane outside of home     Hand Dominance        Extremity/Trunk Assessment   Upper Extremity Assessment Upper Extremity Assessment: Overall WFL for tasks assessed    Lower Extremity Assessment Lower Extremity Assessment: Generalized weakness    Cervical / Trunk Assessment Cervical / Trunk Assessment: Normal  Communication   Communication: No difficulties  Cognition Arousal/Alertness: Awake/alert Behavior During Therapy: WFL for tasks assessed/performed Overall Cognitive Status: Within Functional Limits for tasks assessed  General Comments      Exercises     Assessment/Plan    PT Assessment Patient needs continued PT services  PT Problem List Decreased mobility;Decreased strength;Decreased balance       PT Treatment Interventions Gait training;DME instruction;Functional mobility training;Therapeutic activities;Therapeutic exercise;Balance training;Patient/family education    PT Goals (Current goals can be found in the Care Plan section)  Acute Rehab PT Goals Patient Stated Goal: home PT Goal Formulation: With patient Time For Goal Achievement:  09/25/17 Potential to Achieve Goals: Good    Frequency Min 3X/week   Barriers to discharge        Co-evaluation               AM-PAC PT "6 Clicks" Daily Activity  Outcome Measure Difficulty turning over in bed (including adjusting bedclothes, sheets and blankets)?: None Difficulty moving from lying on back to sitting on the side of the bed? : None Difficulty sitting down on and standing up from a chair with arms (e.g., wheelchair, bedside commode, etc,.)?: None Help needed moving to and from a bed to chair (including a wheelchair)?: A Little Help needed walking in hospital room?: A Little Help needed climbing 3-5 steps with a railing? : A Little 6 Click Score: 21    End of Session Equipment Utilized During Treatment: Gait belt Activity Tolerance: Patient tolerated treatment well Patient left: in chair;with call bell/phone within reach;with nursing/sitter in room   PT Visit Diagnosis: Muscle weakness (generalized) (M62.81);Unsteadiness on feet (R26.81)    Time: 2505-3976 PT Time Calculation (min) (ACUTE ONLY): 8 min   Charges:   PT Evaluation $PT Eval Moderate Complexity: 1 Mod     PT G Codes:         Weston Anna, MPT Pager: [phone]

## 2017-09-11 NOTE — Discharge Summary (Signed)
Physician Discharge Summary  [name] MAU:[id number] DOB: 08/21/1934 DOA: 09/08/2017  PCP: Michael Mealy, MD  Admit date: 09/08/2017 Discharge date: 09/11/2017  Admitted From: Home Disposition:  Home  Recommendations for Outpatient Follow-up:  1. Follow up with PCP in 1 week 2. Please follow up on the following pending results: Final blood culture results, final CSF culture result.  Both are negative growth at time of discharge.  Home Health: PT   Equipment/Devices: None   Discharge Condition: Stable CODE STATUS: Full  Diet recommendation: Heart healthy   Brief/Interim Summary: Michael Cobb is a 83 yo male with medical history significant ofcoronary artery disease, diabetes, hypertension, degenerative disc disease who was transferred [facility] he presented this evening with severe headache, fever and chills. Patient had a temperature of 103. He has been having symptoms for about 2 days. He was having chills with the uncontrolled headaches rated as 10 out of 10. Also neck pain. Patient's lactate was 4. Sepsis protocol was initiated after fluid boluses lactate went up to 4.2. Patient was scheduled for possible lumbar puncture however due to previous back surgery they were unable to do it in the ER. They have no capacities for lumbar puncture under fluoroscopy. He was empirically started on IV antibiotics and sent over here for possible LP under fluoroscopy and care of PCCM. PCCM has reviewed case and recommended stepdown admission under medicine.   Reports of imaging from [facility] as below:  CT head without contrast 7/5: No acute intracranial abnormality. Stable noncontrast CT appearance of the brain with mild for age nonspecific white matter changes.   CT abd/pelvis without contrast 7/5: No acute abnormality of the abdomen or pelvis. Chronic slight prominence of the distal esophageal mucosa. This could represent esophagitis. Small bilateral renal  calculi, less numerous than on the prior study.   CXR 7/5: No acute disease  Patient was treated with empiric vancomycin, ceftriaxone, ampicillin for suspected meningitis.  He underwent lumbar puncture which ruled out bacterial meningitis.  His antibiotics were stopped.  Patient's clinical status continued to improve.  His sepsis could be secondary to aseptic meningitis.  His headache resolved, leukocytosis improved.  On 7/7, he had acute hypoxemic respiratory failure, secondary to fluid overload in setting of iatrogenic IV fluid administration.  He was given IV Lasix with improvement.  On day of discharge, he was on room air and feeling well.  Him and his roommate stated that he was doing better than prior to admission.  He worked with physical therapy who recommended home health physical therapy.  He is discharged home table condition.  Discharge Diagnoses:  Principal Problem:   Sepsis ([place]) Active Problems:   CAD (coronary artery disease)   Generalized headaches   DDD (degenerative disc disease), cervical   DM2 (diabetes mellitus, type 2) (HCC)   Benign essential HTN   Sepsis secondary to suspected aseptic meningitis -LP 7/6 showed WBC 1 and gram stain negative, culture negative growth to date. Antibiotics stopped -Blood cultures pending, negative growth to date -Leukocytosis resolved. Unclear etiology of patient's leukocytosis and sepsis, could be aseptic meningitis.  Continue supportive care.  Improved clinically  Acute hypoxemic respiratory failure  -Likely due to fluid overload in setting of iatrogenic IVF -Stop IVF, CXR obtained which was personally reviewed, revealed some pulmonary vascular congestion compared to previous CXR. IV lasix given with improvement -Resolved, on room air this morning without respiratory distress  CKD stage 3  -Baseline Cr 1.5  -Stable  HTN -Continue  norvasc, ACE   DM with hyperglycemia  -Hold metformin, glipizide. Metformin stopped on  discharge due to CKD stage 3.  -SSI   HLD -Continue crestor  CAD -Resume aspirin     Discharge Instructions  Discharge Instructions    Call MD for:  difficulty breathing, headache or visual disturbances   Complete by:  As directed    Call MD for:  extreme fatigue   Complete by:  As directed    Call MD for:  hives   Complete by:  As directed    Call MD for:  persistant dizziness or light-headedness   Complete by:  As directed    Call MD for:  persistant nausea and vomiting   Complete by:  As directed    Call MD for:  severe uncontrolled pain   Complete by:  As directed    Call MD for:  temperature >100.4   Complete by:  As directed    Diet - low sodium heart healthy   Complete by:  As directed    Discharge instructions   Complete by:  As directed    You were cared for by a hospitalist during your hospital stay. If you have any questions about your discharge medications or the care you received while you were in the hospital after you are discharged, you can call the unit and ask to speak with the hospitalist on call if the hospitalist that took care of you is not available. Once you are discharged, your primary care physician will handle any further medical issues. Please note that NO REFILLS for any discharge medications will be authorized once you are discharged, as it is imperative that you return to your primary care physician (or establish a relationship with a primary care physician if you do not have one) for your aftercare needs so that they can reassess your need for medications and monitor your lab values.   Increase activity slowly   Complete by:  As directed      Allergies as of 09/11/2017      Reactions   Morphine And Related Other (See Comments)   Hallucinate      Medication List    STOP taking these medications   metFORMIN 500 MG tablet Commonly known as:  GLUCOPHAGE   potassium citrate 10 MEQ (1080 MG) SR tablet Commonly known as:  UROCIT-K     TAKE  these medications   allopurinol 300 MG tablet Commonly known as:  ZYLOPRIM Take 300 mg by mouth daily.   amLODipine 5 MG tablet Commonly known as:  NORVASC Take 1 tablet (5 mg total) by mouth daily.   aspirin EC 81 MG tablet Take 81 mg by mouth at bedtime.   cetirizine 10 MG tablet Commonly known as:  ZYRTEC Take 10 mg by mouth at bedtime.   CoQ10 200 MG Caps Take 1 capsule by mouth 2 (two) times daily.   docusate sodium 100 MG capsule Commonly known as:  COLACE Take 100 mg by mouth 2 (two) times daily.   DULoxetine 20 MG capsule Commonly known as:  CYMBALTA Take 20 mg by mouth daily.   FISH OIL PO Take 1 tablet by mouth 2 (two) times daily.   fluticasone 50 MCG/ACT nasal spray Commonly known as:  FLONASE Place 1 spray into both nostrils daily as needed for allergies or rhinitis.   furosemide 20 MG tablet Commonly known as:  LASIX Take 20 mg by mouth daily as needed for fluid.   glipiZIDE 10 MG  tablet Commonly known as:  GLUCOTROL Take 10 mg by mouth 2 (two) times daily before a meal.   HYDROcodone-acetaminophen 7.5-325 MG tablet Commonly known as:  NORCO Take 1 tablet by mouth every 8 (eight) hours as needed for moderate pain or severe pain.   magnesium oxide 400 MG tablet Commonly known as:  MAG-OX Take 400 mg by mouth 2 (two) times daily.   ramipril 10 MG capsule Commonly known as:  ALTACE Take 10 mg by mouth 2 (two) times daily.   rosuvastatin 5 MG tablet Commonly known as:  CRESTOR Take 1 tablet (5 mg total) by mouth daily. What changed:  when to take this   tamsulosin 0.4 MG Caps capsule Commonly known as:  FLOMAX Take 0.4 mg by mouth at bedtime.      Follow-up Information    Michael Mealy, MD. Schedule an appointment as soon as possible for a visit in 1 week(s).   Specialty:  Family Medicine Contact information: [address] [place] [address] [phone]          Allergies  Allergen Reactions  . Morphine And  Related Other (See Comments)    Hallucinate    Consultations:  IR for LP    Procedures/Studies: Dg Chest Port 1 View  Result Date: 09/10/2017 CLINICAL DATA:  Respiratory distress EXAM: PORTABLE CHEST 1 VIEW COMPARISON:  September 08, 2017 FINDINGS: There is atelectatic change in each lower lung zone with small right pleural effusion. There is no edema or consolidation. Heart is borderline prominent with pulmonary vascularity normal. No adenopathy. There is aortic atherosclerosis. IMPRESSION: Stable lower lobe atelectasis bilaterally with small right pleural effusion. No edema or consolidation. Stable cardiac silhouette. There is aortic atherosclerosis. Aortic Atherosclerosis (ICD10-I70.0). Electronically Signed   By: Lowella Grip III M.D.   On: 09/10/2017 08:03   Dg Chest Port 1 View  Result Date: 09/08/2017 CLINICAL DATA:  83 year old male with sepsis. EXAM: PORTABLE CHEST 1 VIEW COMPARISON:  09/08/2017 CT and 04/04/2017 radiographs FINDINGS: The cardiomediastinal silhouette is unremarkable. Mild RIGHT basilar atelectasis/scarring is unchanged. There is no evidence of focal airspace disease, pulmonary edema, suspicious pulmonary nodule/mass, pleural effusion, or pneumothorax. No acute bony abnormalities are identified. IMPRESSION: No acute abnormality. Electronically Signed   By: Margarette Canada M.D.   On: 09/08/2017 23:36   Dg Fluoro Guide Lumbar Puncture  Result Date: 09/09/2017 CLINICAL DATA:  83 year old male with concern for meningitis EXAM: DIAGNOSTIC LUMBAR PUNCTURE UNDER FLUOROSCOPIC GUIDANCE FLUOROSCOPY TIME:  Fluoroscopy Time:  6 seconds PROCEDURE: Informed consent was obtained from the patient prior to the procedure, including potential complications of headache, allergy, and pain. With the patient prone, the lower back was prepped with Betadine. 1% Lidocaine was used for local anesthesia. Lumbar puncture was performed at the L4 level using a 3.5 inch 20 gauge needle with return of clear CSF  with an opening pressure of 5 cm water. 11-12 ml of CSF were obtained for laboratory studies. The patient tolerated the procedure well and there were no apparent complications. IMPRESSION: Status post fluoro guided lumbar puncture. Electronically Signed   By: Corrie Mckusick D.O.   On: 09/09/2017 13:07       Discharge Exam: Vitals:   09/10/17 2134 09/11/17 0457  BP: (!) 162/76 (!) 154/76  Pulse: 64 (!) 58  Resp: 16 18  Temp: 98.5 F (36.9 C) 98 F (36.7 C)  SpO2: 97% 96%    General: Pt is alert, awake, not in acute distress Cardiovascular: RRR, S1/S2 +,  no rubs, no gallops Respiratory: CTA bilaterally, no wheezing, no rhonchi Abdominal: Soft, NT, ND, bowel sounds + Extremities: no edema, no cyanosis Neuro: alert, oriented, conversation fluent, no focal deficits    The results of significant diagnostics from this hospitalization (including imaging, microbiology, ancillary and laboratory) are listed below for reference.     Microbiology: Recent Results (from the past 240 hour(s))  MRSA PCR Screening     Status: None   Collection Time: 09/08/17 11:03 PM  Result Value Ref Range Status   MRSA by PCR NEGATIVE NEGATIVE Final    Comment:        The GeneXpert MRSA Assay (FDA approved for NASAL specimens only), is one component of a comprehensive MRSA colonization surveillance program. It is not intended to diagnose MRSA infection nor to guide or monitor treatment for MRSA infections. Performed at [facility], [place] [address] [place], [place] [address]   Culture, blood (x 2)     Status: None (Preliminary result)   Collection Time: 09/08/17 11:40 PM  Result Value Ref Range Status   Specimen Description   Final    BLOOD BLOOD RIGHT FOREARM Performed at [facility] [address], [place], [place] [address]    Special Requests   Final    BOTTLES DRAWN AEROBIC ONLY Blood Culture results may not be optimal due to an inadequate volume of  blood received in culture bottles Performed at [facility] [address] [place], [place] [address]    Culture   Final    NO GROWTH 1 DAY Performed at [facility], [place] [address] [place], [place] [address]    Report Status PENDING  Incomplete  Culture, blood (x 2)     Status: None (Preliminary result)   Collection Time: 09/08/17 11:41 PM  Result Value Ref Range Status   Specimen Description   Final    BLOOD RIGHT ANTECUBITAL Performed at [facility] [address] [place], [place] [address]    Special Requests   Final    BOTTLES DRAWN AEROBIC AND ANAEROBIC Blood Culture adequate volume Performed at [facility] [address] [place], [place] [address]    Culture   Final    NO GROWTH 1 DAY Performed at [facility], [place] [address], [place], [place] [address]    Report Status PENDING  Incomplete  CSF culture     Status: None (Preliminary result)   Collection Time: 09/09/17 12:30 PM  Result Value Ref Range Status   Specimen Description   Final    LUMBAR PUNCTURE Performed at [facility] [address] [place], [place] [address]    Special Requests   Final    NONE Performed at [facility], [place] [address], [place], [place] [address]    Gram Stain   Final    NO ORGANISMS SEEN NO WBC SEEN Gram Stain Report Called to,Read Back By and Verified With: ZDGU,Y AT 1500 ON [id number] BY HOOKER,B CYTOSPIN SMEAR    Culture   Final    NO GROWTH 2 DAYS Performed at [facility], [place] [address], [place],  [address]    Report Status PENDING  Incomplete     Labs: BNP (last 3 results) No results for input(s): BNP in the last 8760 hours. Basic Metabolic Panel: Recent Labs  Lab 09/08/17 2350 09/09/17 0342 09/10/17 0353 09/11/17 0421  NA 139 138 141 141  K  4.6 4.8 4.0 3.8  CL 107 105 109 106  CO2 22 22 25 26   GLUCOSE 227* 256* 171* 179*  BUN 22 23  26* 21  CREATININE 1.50* 1.48* 1.43* 1.31*  CALCIUM 8.6* 8.4* 8.2* 8.4*   Liver Function Tests: Recent Labs  Lab 09/08/17 2350 09/09/17 0342  AST 90* 123*  ALT 22 26  ALKPHOS 46 45  BILITOT 1.0 1.6*  PROT 6.1* 5.8*  ALBUMIN 3.2* 3.0*   No results for input(s): LIPASE, AMYLASE in the last 168 hours. No results for input(s): AMMONIA in the last 168 hours. CBC: Recent Labs  Lab 09/08/17 2350 09/09/17 0342 09/10/17 0353 09/11/17 0421  WBC 17.7* 17.6* 12.9* 8.4  NEUTROABS 15.5*  --   --   --   HGB 11.7* 11.1* 10.4* 10.8*  HCT 34.7* 32.7* 31.4* 32.5*  MCV 93.5 93.4 94.6 93.1  PLT 213 203 186 195   Cardiac Enzymes: No results for input(s): CKTOTAL, CKMB, CKMBINDEX, TROPONINI in the last 168 hours. BNP: Invalid input(s): POCBNP CBG: Recent Labs  Lab 09/10/17 0803 09/10/17 1141 09/10/17 1620 09/10/17 2130 09/11/17 0734  GLUCAP 167* 333* 285* 280* 161*   D-Dimer No results for input(s): DDIMER in the last 72 hours. Hgb A1c No results for input(s): HGBA1C in the last 72 hours. Lipid Profile No results for input(s): CHOL, HDL, LDLCALC, TRIG, CHOLHDL, LDLDIRECT in the last 72 hours. Thyroid function studies No results for input(s): TSH, T4TOTAL, T3FREE, THYROIDAB in the last 72 hours.  Invalid input(s): FREET3 Anemia work up No results for input(s): VITAMINB12, FOLATE, FERRITIN, TIBC, IRON, RETICCTPCT in the last 72 hours. Urinalysis No results found for: COLORURINE, APPEARANCEUR, [place], [place], [place], [place], [place], [place], PROTEINUR, UROBILINOGEN, NITRITE, LEUKOCYTESUR Sepsis Labs Invalid input(s): PROCALCITONIN,  WBC,  LACTICIDVEN Microbiology Recent Results (from the past 240 hour(s))  MRSA PCR Screening     Status: None   Collection Time: 09/08/17 11:03 PM  Result Value Ref Range Status   MRSA by PCR NEGATIVE NEGATIVE Final    Comment:        The GeneXpert MRSA Assay (FDA approved for NASAL specimens only), is one component of  a comprehensive MRSA colonization surveillance program. It is not intended to diagnose MRSA infection nor to guide or monitor treatment for MRSA infections. Performed at [facility], [place] [address], [place], [place] [address]   Culture, blood (x 2)     Status: None (Preliminary result)   Collection Time: 09/08/17 11:40 PM  Result Value Ref Range Status   Specimen Description   Final    BLOOD BLOOD RIGHT FOREARM Performed at [facility] [address] [place], [place] [address]    Special Requests   Final    BOTTLES DRAWN AEROBIC ONLY Blood Culture results may not be optimal due to an inadequate volume of blood received in culture bottles Performed at [facility] [address] [place], [place] [address]    Culture   Final    NO GROWTH 1 DAY Performed at [facility], [place] [address], [place], [place] [address]    Report Status PENDING  Incomplete  Culture, blood (x 2)     Status: None (Preliminary result)   Collection Time: 09/08/17 11:41 PM  Result Value Ref Range Status   Specimen Description   Final    BLOOD RIGHT ANTECUBITAL Performed at [facility] [address], [place],  [address]    Special Requests  Final    BOTTLES DRAWN AEROBIC AND ANAEROBIC Blood Culture adequate volume Performed at [facility] [address], [place], [place] [address]    Culture   Final    NO GROWTH 1 DAY Performed at [facility], [place] [address] [place], [place] [address]    Report Status PENDING  Incomplete  CSF culture     Status: None (Preliminary result)   Collection Time: 09/09/17 12:30 PM  Result Value Ref Range Status   Specimen Description   Final    LUMBAR PUNCTURE Performed at [facility] [address], [place], [place] [address]    Special Requests   Final    NONE Performed at [facility], [place] [address], [place], [place] [address]    Gram Stain   Final    NO ORGANISMS SEEN NO WBC SEEN Gram Stain Report Called to,Read Back By and Verified With: ERDE,Y AT 1500 ON [id number] BY HOOKER,B CYTOSPIN SMEAR    Culture   Final    NO GROWTH 2 DAYS Performed at [facility], [place] [address], [place],  [address]    Report Status PENDING  Incomplete     Patient was seen and examined on the day of discharge and was found to be in stable condition. Time coordinating discharge: 25 minutes including assessment and coordination of care, as well as examination of the patient.   SIGNED:  Dessa Phi, DO [facility] Pager [phone]  If 7PM-7AM, please contact night-coverage [email or web address] Password [facility] 09/11/2017, 10:33 AM

## 2017-09-12 LAB — CSF CULTURE W GRAM STAIN: Gram Stain: NONE SEEN

## 2017-09-12 LAB — CSF CULTURE: Culture: NO GROWTH

## 2017-09-14 LAB — CULTURE, BLOOD (ROUTINE X 2)
Culture: NO GROWTH
Culture: NO GROWTH
Special Requests: ADEQUATE

## 2017-09-20 ENCOUNTER — Encounter (HOSPITAL_COMMUNITY): Payer: Medicare Other

## 2017-09-26 ENCOUNTER — Other Ambulatory Visit: Payer: Self-pay | Admitting: Cardiology

## 2017-11-13 ENCOUNTER — Ambulatory Visit (INDEPENDENT_AMBULATORY_CARE_PROVIDER_SITE_OTHER): Payer: Medicare Other | Admitting: Cardiovascular Disease

## 2017-11-13 ENCOUNTER — Encounter: Payer: Self-pay | Admitting: Cardiovascular Disease

## 2017-11-13 VITALS — BP 122/64 | HR 68 | Ht 70.0 in | Wt 226.0 lb

## 2017-11-13 DIAGNOSIS — R55 Syncope and collapse: Secondary | ICD-10-CM | POA: Diagnosis not present

## 2017-11-13 DIAGNOSIS — N183 Chronic kidney disease, stage 3 unspecified: Secondary | ICD-10-CM

## 2017-11-13 DIAGNOSIS — I10 Essential (primary) hypertension: Secondary | ICD-10-CM | POA: Diagnosis not present

## 2017-11-13 DIAGNOSIS — I25118 Atherosclerotic heart disease of native coronary artery with other forms of angina pectoris: Secondary | ICD-10-CM

## 2017-11-13 DIAGNOSIS — R531 Weakness: Secondary | ICD-10-CM | POA: Diagnosis not present

## 2017-11-13 DIAGNOSIS — E785 Hyperlipidemia, unspecified: Secondary | ICD-10-CM

## 2017-11-13 NOTE — Patient Instructions (Signed)

## 2017-11-13 NOTE — Progress Notes (Signed)
SUBJECTIVE: The patient presents for routine follow-up.  He was hospitalized for sepsis secondary to suspected aseptic meningitis in early July 2019.  He had acute hypoxemic rest tori failure likely due to fluid overload in the setting of iatrogenic IV fluids.  He denies chest pain, palpitations, and shortness of breath.  He struggles with chronic hip and cervical spine pain.  He is going to the pain clinic in hopes to receive injections.  He does not get adequate sleep and feels tired throughout the day.      Review of Systems: As per "subjective", otherwise negative.  Allergies  Allergen Reactions  . Morphine And Related Other (See Comments)    Hallucinate    Current Outpatient Medications  Medication Sig Dispense Refill  . allopurinol (ZYLOPRIM) 300 MG tablet Take 300 mg by mouth daily.    Marland Kitchen amLODipine (NORVASC) 5 MG tablet TAKE ONE TABLET BY MOUTH DAILY. 90 tablet 1  . aspirin EC 81 MG tablet Take 81 mg by mouth at bedtime.     . cetirizine (ZYRTEC) 10 MG tablet Take 10 mg by mouth at bedtime.     . Coenzyme Q10 (COQ10) 200 MG CAPS Take 1 capsule by mouth 2 (two) times daily.    Marland Kitchen docusate sodium (COLACE) 100 MG capsule Take 100 mg by mouth 2 (two) times daily.    . DULoxetine (CYMBALTA) 20 MG capsule Take 20 mg by mouth daily.    . fluticasone (FLONASE) 50 MCG/ACT nasal spray Place 1 spray into both nostrils daily as needed for allergies or rhinitis.     . furosemide (LASIX) 20 MG tablet Take 20 mg by mouth daily.     Marland Kitchen gabapentin (NEURONTIN) 300 MG capsule Take 300 mg by mouth 3 (three) times daily.    Marland Kitchen glipiZIDE (GLUCOTROL) 10 MG tablet Take 10 mg by mouth 2 (two) times daily before a meal.   0  . HYDROcodone-acetaminophen (NORCO) 7.5-325 MG tablet Take 1 tablet by mouth every 8 (eight) hours as needed for moderate pain or severe pain.   0  . magnesium oxide (MAG-OX) 400 MG tablet Take 400 mg by mouth 2 (two) times daily.    . metFORMIN (GLUCOPHAGE) 500 MG tablet Take  500 mg by mouth 2 (two) times daily with a meal.    . naproxen (NAPROSYN) 500 MG tablet Take 500 mg by mouth 2 (two) times daily with a meal.    . Omega-3 Fatty Acids (FISH OIL PO) Take 1 tablet by mouth 2 (two) times daily.     . ramipril (ALTACE) 10 MG capsule Take 10 mg by mouth 2 (two) times daily.  0  . rosuvastatin (CRESTOR) 10 MG tablet Take 10 mg by mouth daily.    . tamsulosin (FLOMAX) 0.4 MG CAPS capsule Take 0.4 mg by mouth at bedtime.      No current facility-administered medications for this visit.     Past Medical History:  Diagnosis Date  . BPH (benign prostatic hyperplasia)   . Cancer (HCC)    Skin  . Chronic renal insufficiency, stage 3 (moderate) (HCC)   . Hyperlipidemia   . Hypertension   . Non-ST elevated myocardial infarction (non-STEMI) ([place])   . Syncope   . Type 2 diabetes mellitus ([place])     Past Surgical History:  Procedure Laterality Date  . APPENDECTOMY    . BACK SURGERY    . LEFT HEART CATH AND CORONARY ANGIOGRAPHY N/A 06/27/2017   Procedure: LEFT HEART  CATH AND CORONARY ANGIOGRAPHY;  Surgeon: Burnell Blanks, MD;  Location: [facility];  Service: Cardiovascular;  Laterality: N/A;  . SURGERY OF LIP      Social History   Socioeconomic History  . Marital status: Divorced    Spouse name: Not on file  . Number of children: Not on file  . Years of education: Not on file  . Highest education level: Not on file  Occupational History  . Not on file  Social Needs  . Financial resource strain: Not on file  . Food insecurity:    Worry: Not on file    Inability: Not on file  . Transportation needs:    Medical: Not on file    Non-medical: Not on file  Tobacco Use  . Smoking status: Never Smoker  . Smokeless tobacco: Never Used  Substance and Sexual Activity  . Alcohol use: Never    Frequency: Never  . Drug use: Never  . Sexual activity: Not on file  Lifestyle  . Physical activity:    Days per week: Not on file    Minutes per  session: Not on file  . Stress: Not on file  Relationships  . Social connections:    Talks on phone: Not on file    Gets together: Not on file    Attends religious service: Not on file    Active member of club or organization: Not on file    Attends meetings of clubs or organizations: Not on file    Relationship status: Not on file  . Intimate partner violence:    Fear of current or ex partner: Not on file    Emotionally abused: Not on file    Physically abused: Not on file    Forced sexual activity: Not on file  Other Topics Concern  . Not on file  Social History Narrative  . Not on file     Vitals:   11/13/17 1445  BP: 122/64  Pulse: 68  SpO2: 97%  Weight: 226 lb (102.5 kg)  Height: 5\' 10"  (1.778 m)    Wt Readings from Last 3 Encounters:  11/13/17 226 lb (102.5 kg)  09/08/17 227 lb 8.2 oz (103.2 kg)  08/07/17 230 lb (104.3 kg)     PHYSICAL EXAM General: NAD HEENT: Normal. Neck: No JVD, no thyromegaly. Lungs: Clear to auscultation bilaterally with normal respiratory effort. CV: Regular rate and rhythm, normal S1/S2, no S3/S4, no murmur. No pretibial or periankle edema.  Bilateral venous varicosities.  No carotid bruits. Abdomen: Soft, nontender, no distention.  Neurologic: Alert and oriented.  Psych: Normal affect. Skin: Normal. Musculoskeletal: No gross deformities.    ECG: Reviewed above under Subjective   Labs: Lab Results  Component Value Date/Time   K 3.8 09/11/2017 04:21 AM   BUN 21 09/11/2017 04:21 AM   CREATININE 1.31 (H) 09/11/2017 04:21 AM   ALT 26 09/09/2017 03:42 AM   HGB 10.8 (L) 09/11/2017 04:21 AM     Lipids: No results found for: LDLCALC, LDLDIRECT, CHOL, TRIG, HDL     ASSESSMENT AND PLAN:  1.  Severe triple-vessel coronary artery disease with generalized fatigue and weakness: Symptomatically stable.  He is not a candidate for CABG and medical management was recommended.  Currently on aspirin, Crestor, and amlodipine. I  previously made a referral to cardiac rehabilitation but he deferred.  2.  Syncope: No further recurrences.  3.  Hypertension: Blood pressure is normal.  No changes to therapy.  4.  Chronic  kidney disease stage III: BUN 21, creatinine 1.31 on 09/11/2017.  5.  Hyperlipidemia: Continue Crestor 10 mg.    Disposition: Follow up 6 months   [name], M.D., F.A.C.C.

## 2018-03-08 ENCOUNTER — Other Ambulatory Visit: Payer: Self-pay | Admitting: Neurosurgery

## 2018-03-08 DIAGNOSIS — M7061 Trochanteric bursitis, right hip: Secondary | ICD-10-CM

## 2018-03-14 ENCOUNTER — Other Ambulatory Visit: Payer: Self-pay | Admitting: Cardiology

## 2018-03-16 ENCOUNTER — Ambulatory Visit
Admission: RE | Admit: 2018-03-16 | Discharge: 2018-03-16 | Disposition: A | Discharge status: Home / Self Care | Payer: Medicare Other | Source: Ambulatory Visit | Attending: Neurosurgery | Admitting: Neurosurgery

## 2018-03-16 DIAGNOSIS — M7061 Trochanteric bursitis, right hip: Secondary | ICD-10-CM

## 2018-03-16 MED ORDER — METHYLPREDNISOLONE ACETATE 40 MG/ML INJ SUSP (RADIOLOG
120.0000 mg | Freq: Once | INTRAMUSCULAR | Status: AC
  Administered 2018-03-16: 120 mg via INTRA_ARTICULAR

## 2018-03-16 MED ORDER — IOPAMIDOL (ISOVUE-M 200) INJECTION 41%
1.0000 mL | Freq: Once | INTRAMUSCULAR | Status: AC
  Administered 2018-03-16: 1 mL

## 2018-04-17 ENCOUNTER — Ambulatory Visit: Payer: Medicare Other | Admitting: Urology

## 2018-04-17 DIAGNOSIS — R3912 Poor urinary stream: Secondary | ICD-10-CM

## 2018-04-17 DIAGNOSIS — N200 Calculus of kidney: Secondary | ICD-10-CM

## 2018-04-17 DIAGNOSIS — N401 Enlarged prostate with lower urinary tract symptoms: Secondary | ICD-10-CM | POA: Diagnosis not present

## 2018-05-02 ENCOUNTER — Other Ambulatory Visit (HOSPITAL_COMMUNITY): Payer: Self-pay | Admitting: Urology

## 2018-05-02 ENCOUNTER — Ambulatory Visit (HOSPITAL_COMMUNITY)
Admission: RE | Admit: 2018-05-02 | Discharge: 2018-05-02 | Disposition: A | Discharge status: Home / Self Care | Payer: Medicare Other | Source: Ambulatory Visit | Attending: Urology | Admitting: Urology

## 2018-05-02 DIAGNOSIS — N200 Calculus of kidney: Secondary | ICD-10-CM | POA: Insufficient documentation

## 2018-05-28 ENCOUNTER — Telehealth: Payer: Self-pay | Admitting: *Deleted

## 2018-05-28 NOTE — Telephone Encounter (Signed)
   Primary Cardiologist:  [name], MD   Patient contacted.  History reviewed.  No symptoms to suggest any unstable cardiac conditions.  Based on discussion, with current pandemic situation, we will be postponing this appointment till 09/10/2018.  If symptoms change, he has been instructed to contact our office.   Spoke with daughter Beckie Busing).    Michael Cobb

## 2018-05-28 NOTE — Telephone Encounter (Signed)
Attempt to contact patient to rescheduled due to covid19.  No answer on home phone.  Left message to return call on daughter voice mail.

## 2018-05-29 ENCOUNTER — Ambulatory Visit: Payer: Medicare Other | Admitting: Cardiovascular Disease

## 2018-09-04 ENCOUNTER — Ambulatory Visit (INDEPENDENT_AMBULATORY_CARE_PROVIDER_SITE_OTHER): Payer: Medicare Other | Admitting: Urology

## 2018-09-04 DIAGNOSIS — N200 Calculus of kidney: Secondary | ICD-10-CM

## 2018-09-07 ENCOUNTER — Other Ambulatory Visit: Payer: Self-pay | Admitting: Cardiology

## 2018-09-10 ENCOUNTER — Telehealth: Payer: Self-pay | Admitting: *Deleted

## 2018-09-10 ENCOUNTER — Ambulatory Visit: Payer: Medicare Other | Admitting: Cardiovascular Disease

## 2018-09-10 NOTE — Telephone Encounter (Signed)
    COVID-19 Pre-Screening Questions:  . In the past 7 to 10 days have you had a cough,  shortness of breath, headache, congestion, fever (100 or greater) body aches, chills, sore throat, or sudden loss of taste or sense of smell? NO  . Have you been around anyone with known Covid 19. NO  . Have you been around anyone who is awaiting Covid 19 test results in the past 7 to 10 days? NO . Have you been around anyone who has been exposed to Covid 19, or has mentioned symptoms of Covid 19 within the past 7 to 10 days? NO

## 2018-09-11 ENCOUNTER — Other Ambulatory Visit: Payer: Self-pay

## 2018-09-11 ENCOUNTER — Ambulatory Visit (INDEPENDENT_AMBULATORY_CARE_PROVIDER_SITE_OTHER): Payer: Medicare Other | Admitting: Cardiovascular Disease

## 2018-09-11 ENCOUNTER — Encounter: Payer: Self-pay | Admitting: Cardiovascular Disease

## 2018-09-11 VITALS — BP 130/70 | HR 76 | Temp 99.4°F | Ht 70.0 in | Wt 236.0 lb

## 2018-09-11 DIAGNOSIS — I10 Essential (primary) hypertension: Secondary | ICD-10-CM

## 2018-09-11 DIAGNOSIS — E785 Hyperlipidemia, unspecified: Secondary | ICD-10-CM | POA: Diagnosis not present

## 2018-09-11 DIAGNOSIS — I25118 Atherosclerotic heart disease of native coronary artery with other forms of angina pectoris: Secondary | ICD-10-CM

## 2018-09-11 NOTE — Progress Notes (Addendum)
SUBJECTIVE: The patient presents for follow-up of coronary artery disease.  He is not a candidate for CABG and medical management has been recommended.  He struggles with chronic hip and cervical spine pain.  He denies chest pain and shortness of breath.  He has chronic bilateral leg edema and takes Lasix 20 mg every evening.  He denies orthopnea and paroxysmal nocturnal dyspnea.  Due to chronic pain he gets poor quality sleep.  He said he has been snacking more.  ECG performed in the office today which I ordered and personally interpreted demonstrates sinus rhythm with subtle P waves, possible old inferior infarct pattern, and LVH.   Review of Systems: As per "subjective", otherwise negative.  Allergies  Allergen Reactions  . Morphine And Related Other (See Comments)    Hallucinate    Current Outpatient Medications  Medication Sig Dispense Refill  . allopurinol (ZYLOPRIM) 300 MG tablet Take 300 mg by mouth daily.    Marland Kitchen amLODipine (NORVASC) 5 MG tablet TAKE ONE TABLET BY MOUTH EVERY DAY 90 tablet 0  . aspirin EC 81 MG tablet Take 81 mg by mouth at bedtime.     . cetirizine (ZYRTEC) 10 MG tablet Take 10 mg by mouth at bedtime.     . Coenzyme Q10 (COQ10) 200 MG CAPS Take 1 capsule by mouth 2 (two) times daily.    Marland Kitchen docusate sodium (COLACE) 100 MG capsule Take 100 mg by mouth 2 (two) times daily.    . DULoxetine (CYMBALTA) 20 MG capsule Take 20 mg by mouth daily.    . fluticasone (FLONASE) 50 MCG/ACT nasal spray Place 1 spray into both nostrils daily as needed for allergies or rhinitis.     . furosemide (LASIX) 20 MG tablet Take 20 mg by mouth daily.     Marland Kitchen gabapentin (NEURONTIN) 300 MG capsule Take 300 mg by mouth 3 (three) times daily.    Marland Kitchen glipiZIDE (GLUCOTROL) 10 MG tablet Take 10 mg by mouth 2 (two) times daily before a meal.   0  . HYDROcodone-acetaminophen (NORCO) 7.5-325 MG tablet Take 1 tablet by mouth every 8 (eight) hours as needed for moderate pain or severe pain.    0  . magnesium oxide (MAG-OX) 400 MG tablet Take 400 mg by mouth 2 (two) times daily.    . metFORMIN (GLUCOPHAGE) 500 MG tablet Take 500 mg by mouth 2 (two) times daily with a meal.    . naproxen (NAPROSYN) 500 MG tablet Take 500 mg by mouth 2 (two) times daily with a meal.    . Omega-3 Fatty Acids (FISH OIL PO) Take 1 tablet by mouth 2 (two) times daily.     . ramipril (ALTACE) 10 MG capsule Take 10 mg by mouth 2 (two) times daily.  0  . rosuvastatin (CRESTOR) 10 MG tablet Take 10 mg by mouth daily.    . tamsulosin (FLOMAX) 0.4 MG CAPS capsule Take 0.4 mg by mouth at bedtime.      No current facility-administered medications for this visit.     Past Medical History:  Diagnosis Date  . BPH (benign prostatic hyperplasia)   . Cancer (HCC)    Skin  . Chronic renal insufficiency, stage 3 (moderate) (HCC)   . Hyperlipidemia   . Hypertension   . Non-ST elevated myocardial infarction (non-STEMI) ([place])   . Syncope   . Type 2 diabetes mellitus ([place])     Past Surgical History:  Procedure Laterality Date  . APPENDECTOMY    .  BACK SURGERY    . LEFT HEART CATH AND CORONARY ANGIOGRAPHY N/A 06/27/2017   Procedure: LEFT HEART CATH AND CORONARY ANGIOGRAPHY;  Surgeon: Burnell Blanks, MD;  Location: [facility];  Service: Cardiovascular;  Laterality: N/A;  . SURGERY OF LIP      Social History   Socioeconomic History  . Marital status: Divorced    Spouse name: Not on file  . Number of children: Not on file  . Years of education: Not on file  . Highest education level: Not on file  Occupational History  . Not on file  Social Needs  . Financial resource strain: Not on file  . Food insecurity    Worry: Not on file    Inability: Not on file  . Transportation needs    Medical: Not on file    Non-medical: Not on file  Tobacco Use  . Smoking status: Never Smoker  . Smokeless tobacco: Never Used  Substance and Sexual Activity  . Alcohol use: Never    Frequency: Never  .  Drug use: Never  . Sexual activity: Not on file  Lifestyle  . Physical activity    Days per week: Not on file    Minutes per session: Not on file  . Stress: Not on file  Relationships  . Social Herbalist on phone: Not on file    Gets together: Not on file    Attends religious service: Not on file    Active member of club or organization: Not on file    Attends meetings of clubs or organizations: Not on file    Relationship status: Not on file  . Intimate partner violence    Fear of current or ex partner: Not on file    Emotionally abused: Not on file    Physically abused: Not on file    Forced sexual activity: Not on file  Other Topics Concern  . Not on file  Social History Narrative  . Not on file     Vitals:   09/11/18 1307  BP: 130/70  Pulse: 76  Temp: 99.4 F (37.4 C)  SpO2: 98%  Weight: 236 lb (107 kg)  Height: 5\' 10"  (1.778 m)    Wt Readings from Last 3 Encounters:  09/11/18 236 lb (107 kg)  11/13/17 226 lb (102.5 kg)  09/08/17 227 lb 8.2 oz (103.2 kg)     PHYSICAL EXAM General: NAD HEENT: Normal. Neck: No JVD, no thyromegaly. Lungs: Clear to auscultation bilaterally with normal respiratory effort. CV: Regular rate and rhythm, normal S1/S2, no S3/S4, no murmur.  Trace bilateral lower extremity edema.  No carotid bruit.   Abdomen: Soft, nontender, no distention.  Neurologic: Alert and oriented.  Psych: Normal affect. Skin: Normal. Musculoskeletal: No gross deformities.    ECG: Reviewed above under Subjective   Labs: Lab Results  Component Value Date/Time   K 3.8 09/11/2017 04:21 AM   BUN 21 09/11/2017 04:21 AM   CREATININE 1.31 (H) 09/11/2017 04:21 AM   ALT 26 09/09/2017 03:42 AM   HGB 10.8 (L) 09/11/2017 04:21 AM     Lipids: No results found for: LDLCALC, LDLDIRECT, CHOL, TRIG, HDL     ASSESSMENT AND PLAN: 1.  Coronary artery disease: Symptomatically stable.  He is not a candidate for CABG and medical management has been  recommended.  Continue aspirin and amlodipine. He is statin intolerant and has tried multiple statins in the past which have all exacerbated his chronic joint pain.  2.  Hypertension: Controlled on present therapy.  No changes.  3.  Hyperlipidemia: I will check lipids.  He is statin intolerant and has tried multiple statins in the past which have all exacerbated his chronic joint pain.    Disposition: Follow up 6 months   [name], M.D., F.A.C.C.

## 2018-09-11 NOTE — Patient Instructions (Signed)
Medication Instructions:  Continue all current medications.  Labwork:  Lipids - order given today.   Reminder:  Nothing to eat or drink after 12 midnight prior to labs.    Office will contact with results via phone or letter.    Testing/Procedures: none  Follow-Up: Your physician wants you to follow up in: 6 months.  You will receive a reminder letter in the mail one-two months in advance.  If you don't receive a letter, please call our office to schedule the follow up appointment   Any Other Special Instructions Will Be Listed Below (If Applicable).  If you need a refill on your cardiac medications before your next appointment, please call your pharmacy.

## 2018-11-18 ENCOUNTER — Emergency Department (HOSPITAL_COMMUNITY): Payer: Medicare Other

## 2018-11-18 ENCOUNTER — Emergency Department (HOSPITAL_COMMUNITY)
Admission: EM | Admit: 2018-11-18 | Discharge: 2018-11-18 | Disposition: A | Discharge status: Home / Self Care | Payer: Medicare Other | Attending: Emergency Medicine | Admitting: Emergency Medicine

## 2018-11-18 ENCOUNTER — Encounter (HOSPITAL_COMMUNITY): Payer: Self-pay | Admitting: Emergency Medicine

## 2018-11-18 ENCOUNTER — Other Ambulatory Visit: Payer: Self-pay

## 2018-11-18 DIAGNOSIS — U071 COVID-19: Secondary | ICD-10-CM | POA: Insufficient documentation

## 2018-11-18 DIAGNOSIS — I2510 Atherosclerotic heart disease of native coronary artery without angina pectoris: Secondary | ICD-10-CM | POA: Diagnosis not present

## 2018-11-18 DIAGNOSIS — R5383 Other fatigue: Secondary | ICD-10-CM | POA: Diagnosis present

## 2018-11-18 DIAGNOSIS — Z7982 Long term (current) use of aspirin: Secondary | ICD-10-CM | POA: Insufficient documentation

## 2018-11-18 DIAGNOSIS — Z79899 Other long term (current) drug therapy: Secondary | ICD-10-CM | POA: Diagnosis not present

## 2018-11-18 DIAGNOSIS — J1282 Pneumonia due to coronavirus disease 2019: Secondary | ICD-10-CM

## 2018-11-18 DIAGNOSIS — N183 Chronic kidney disease, stage 3 (moderate): Secondary | ICD-10-CM | POA: Diagnosis not present

## 2018-11-18 DIAGNOSIS — J189 Pneumonia, unspecified organism: Secondary | ICD-10-CM | POA: Insufficient documentation

## 2018-11-18 DIAGNOSIS — I129 Hypertensive chronic kidney disease with stage 1 through stage 4 chronic kidney disease, or unspecified chronic kidney disease: Secondary | ICD-10-CM | POA: Diagnosis not present

## 2018-11-18 DIAGNOSIS — E1122 Type 2 diabetes mellitus with diabetic chronic kidney disease: Secondary | ICD-10-CM | POA: Diagnosis not present

## 2018-11-18 DIAGNOSIS — Z7984 Long term (current) use of oral hypoglycemic drugs: Secondary | ICD-10-CM | POA: Insufficient documentation

## 2018-11-18 LAB — COMPREHENSIVE METABOLIC PANEL
ALT: 51 U/L — ABNORMAL HIGH (ref 0–44)
AST: 55 U/L — ABNORMAL HIGH (ref 15–41)
Albumin: 3.2 g/dL — ABNORMAL LOW (ref 3.5–5.0)
Alkaline Phosphatase: 121 U/L (ref 38–126)
Anion gap: 14 (ref 5–15)
BUN: 22 mg/dL (ref 8–23)
CO2: 15 mmol/L — ABNORMAL LOW (ref 22–32)
Calcium: 9.5 mg/dL (ref 8.9–10.3)
Chloride: 105 mmol/L (ref 98–111)
Creatinine, Ser: 1.43 mg/dL — ABNORMAL HIGH (ref 0.61–1.24)
GFR calc Af Amer: 52 mL/min — ABNORMAL LOW (ref >60)
GFR calc non Af Amer: 45 mL/min — ABNORMAL LOW (ref >60)
Glucose, Bld: 229 mg/dL — ABNORMAL HIGH (ref 70–99)
Potassium: 4.4 mmol/L (ref 3.5–5.1)
Sodium: 134 mmol/L — ABNORMAL LOW (ref 135–145)
Total Bilirubin: 0.9 mg/dL (ref 0.3–1.2)
Total Protein: 7.5 g/dL (ref 6.5–8.1)

## 2018-11-18 LAB — CBC WITH DIFFERENTIAL/PLATELET
Abs Immature Granulocytes: 0.43 10*3/uL — ABNORMAL HIGH (ref 0.00–0.07)
Basophils Absolute: 0.1 10*3/uL (ref 0.0–0.1)
Basophils Relative: 1 %
Eosinophils Absolute: 0.2 10*3/uL (ref 0.0–0.5)
Eosinophils Relative: 2 %
HCT: 38.8 % — ABNORMAL LOW (ref 39.0–52.0)
Hemoglobin: 12.8 g/dL — ABNORMAL LOW (ref 13.0–17.0)
Immature Granulocytes: 6 %
Lymphocytes Relative: 17 %
Lymphs Abs: 1.3 10*3/uL (ref 0.7–4.0)
MCH: 31.1 pg (ref 26.0–34.0)
MCHC: 33 g/dL (ref 30.0–36.0)
MCV: 94.2 fL (ref 80.0–100.0)
Monocytes Absolute: 0.7 10*3/uL (ref 0.1–1.0)
Monocytes Relative: 9 %
Neutro Abs: 4.9 10*3/uL (ref 1.7–7.7)
Neutrophils Relative %: 65 %
Platelets: 468 10*3/uL — ABNORMAL HIGH (ref 150–400)
RBC: 4.12 MIL/uL — ABNORMAL LOW (ref 4.22–5.81)
RDW: 13.7 % (ref 11.5–15.5)
WBC: 7.5 10*3/uL (ref 4.0–10.5)
nRBC: 0 % (ref 0.0–0.2)

## 2018-11-18 LAB — SARS CORONAVIRUS 2 BY RT PCR (HOSPITAL ORDER, PERFORMED IN ~~LOC~~ HOSPITAL LAB): SARS Coronavirus 2: POSITIVE — AB

## 2018-11-18 MED ORDER — IOHEXOL 300 MG/ML  SOLN
75.0000 mL | Freq: Once | INTRAMUSCULAR | Status: AC | PRN
  Administered 2018-11-18: 75 mL via INTRAVENOUS

## 2018-11-18 MED ORDER — SODIUM CHLORIDE 0.9 % IV BOLUS
500.0000 mL | Freq: Once | INTRAVENOUS | Status: AC
  Administered 2018-11-18: 500 mL via INTRAVENOUS

## 2018-11-18 MED ORDER — SODIUM CHLORIDE 0.9 % IV SOLN
INTRAVENOUS | Status: DC
  Administered 2018-11-18: 17:00:00 via INTRAVENOUS

## 2018-11-18 NOTE — Discharge Instructions (Addendum)
Thorough work-up here in the emergency department including CT of the chest is showing signs of developing atypical pneumonia consistent with COVID-19 infection.  However no significant hypoxia or indication for admission.  Patient will need to be followed closely.  If he develops any breathing difficulties will need to be seen again right away.

## 2018-11-18 NOTE — ED Triage Notes (Signed)
Patient c/o generalized weakness, loss of appetite, and loss of smell/tatse x 3 weeks that is progressively getting worse. Per patient tested for Covid 3 weeks ago in which it was positive. Patient reports staying quarantined until now. Denies any fevers, nausea, vomiting, or cough. Patient states some shortness of breath.

## 2018-11-18 NOTE — ED Notes (Signed)
Date and time results received: 11/18/18 6:19 PM  (use smartphrase ".now" to insert current time)  Test: Covid Critical Value: Positive  Name of Provider Notified: Zackowski  Orders Received? Or Actions Taken?: Orders Received - See Orders for details

## 2018-11-18 NOTE — ED Provider Notes (Signed)
[facility] EMERGENCY DEPARTMENT Provider Note   CSN: [id number] Arrival date & time: 11/18/18  1447     History   Chief Complaint Chief Complaint  Patient presents with   Fatigue    HPI Michael Cobb is a 84 y.o. male.     Patient with known COVID-19 infection.  Patient was tested 3 weeks ago and was positive.  Patient's been self quarantining.  Patient probably got the infection from his caregiver who came down with it as well.  Patient talks about generalized weakness and fatigue loss of appetite loss of smell and taste for 3 weeks.  And is progressively getting feeling as if he is getting weaker.  Patient denies any significant shortness of breath.  Denies any fevers nausea vomiting or cough.  Patient's meals are being brought to him by his daughter.  Patient has a history of diabetes.  Patient not on any oxygen.     Past Medical History:  Diagnosis Date   BPH (benign prostatic hyperplasia)    Cancer (HCC)    Skin   Chronic renal insufficiency, stage 3 (moderate) (HCC)    Hyperlipidemia    Hypertension    Non-ST elevated myocardial infarction (non-STEMI) ([place])    Syncope    Type 2 diabetes mellitus ([place])     Patient Active Problem List   Diagnosis Date Noted   Sepsis ([place]) 09/09/2017   Generalized headaches 09/08/2017   DDD (degenerative disc disease), cervical 09/08/2017   DM2 (diabetes mellitus, type 2) ([place]) 09/08/2017   Benign essential HTN 09/08/2017   CAD (coronary artery disease) 07/11/2017   History of non-ST elevation myocardial infarction (NSTEMI) 07/11/2017   Leg pain, bilateral 07/11/2017   Abnormal stress test     Past Surgical History:  Procedure Laterality Date   APPENDECTOMY     BACK SURGERY     LEFT HEART CATH AND CORONARY ANGIOGRAPHY N/A 06/27/2017   Procedure: LEFT HEART CATH AND CORONARY ANGIOGRAPHY;  Surgeon: Burnell Blanks, MD;  Location: [facility];  Service: Cardiovascular;  Laterality: N/A;     SURGERY OF LIP          Home Medications    Prior to Admission medications   Medication Sig Start Date End Date Taking? Authorizing Provider  acetaminophen (TYLENOL) 500 MG tablet Take 500 mg by mouth every 6 (six) hours as needed for mild pain or moderate pain.   Yes [provider]  allopurinol (ZYLOPRIM) 300 MG tablet Take 300 mg by mouth daily.   Yes [provider]  amLODipine (NORVASC) 5 MG tablet TAKE ONE TABLET BY MOUTH EVERY DAY Patient taking differently: Take 5 mg by mouth daily.  09/10/18  Yes Satira Sark, MD  aspirin EC 81 MG tablet Take 81 mg by mouth at bedtime.    Yes [provider]  cetirizine (ZYRTEC) 10 MG tablet Take 10 mg by mouth at bedtime.    Yes [provider]  docusate sodium (COLACE) 100 MG capsule Take 100 mg by mouth 2 (two) times daily.   Yes [provider]  fluticasone (FLONASE) 50 MCG/ACT nasal spray Place 1 spray into both nostrils daily as needed for allergies or rhinitis.    Yes [provider]  furosemide (LASIX) 20 MG tablet Take 20 mg by mouth daily as needed for fluid.    Yes [provider]  glipiZIDE (GLUCOTROL) 10 MG tablet Take 10 mg by mouth 2 (two) times daily before a meal.  02/23/17  Yes [provider]  HYDROcodone-acetaminophen (NORCO/VICODIN) 5-325 MG tablet Take 1 tablet by mouth daily as needed for moderate pain.   Yes [provider]  metFORMIN (GLUCOPHAGE) 500 MG tablet Take 500 mg by mouth 2 (two) times daily with a meal.   Yes [provider]  Omega-3 Fatty Acids (FISH OIL PO) Take 1 tablet by mouth 2 (two) times daily.    Yes [provider]  ramipril (ALTACE) 10 MG capsule Take 10 mg by mouth 2 (two) times daily. 04/07/17  Yes [provider]  tamsulosin (FLOMAX) 0.4 MG CAPS capsule Take 0.4 mg by mouth at bedtime.    Yes [provider]    Family History Family History  Problem Relation Age of Onset    Hypertension Father    CVA Father     Social History Social History   Tobacco Use   Smoking status: Never Smoker   Smokeless tobacco: Never Used  Substance Use Topics   Alcohol use: Never    Frequency: Never   Drug use: Never     Allergies   Morphine and related   Review of Systems Review of Systems  Constitutional: Positive for fatigue. Negative for chills and fever.  HENT: Negative for congestion, rhinorrhea and sore throat.   Eyes: Negative for visual disturbance.  Respiratory: Positive for shortness of breath. Negative for cough.   Cardiovascular: Negative for chest pain and leg swelling.  Gastrointestinal: Negative for abdominal pain, diarrhea, nausea and vomiting.  Genitourinary: Negative for dysuria.  Musculoskeletal: Negative for back pain and neck pain.  Skin: Negative for rash.  Neurological: Positive for weakness. Negative for dizziness, light-headedness and headaches.  Hematological: Does not bruise/bleed easily.  Psychiatric/Behavioral: Negative for confusion.     Physical Exam Updated Vital Signs BP (!) 195/88    Pulse (!) 56    Temp 98.7 F (37.1 C) (Oral)    Resp 15    Ht 1.778 m (5\' 10" )    Wt 96.2 kg    SpO2 100%    BMI 30.42 kg/m   Physical Exam Vitals signs and nursing note reviewed.  Constitutional:      General: He is not in acute distress.    Appearance: Normal appearance. He is well-developed.  HENT:     Head: Normocephalic and atraumatic.  Eyes:     Extraocular Movements: Extraocular movements intact.     Conjunctiva/sclera: Conjunctivae normal.     Pupils: Pupils are equal, round, and reactive to light.  Neck:     Musculoskeletal: Normal range of motion and neck supple.  Cardiovascular:     Rate and Rhythm: Normal rate and regular rhythm.     Heart sounds: No murmur.  Pulmonary:     Effort: Pulmonary effort is normal. No respiratory distress.     Breath sounds: Normal breath sounds. No wheezing.  Abdominal:     Palpations:  Abdomen is soft.     Tenderness: There is no abdominal tenderness.  Musculoskeletal: Normal range of motion.        General: No swelling.  Skin:    General: Skin is warm and dry.  Neurological:     General: No focal deficit present.     Mental Status: He is alert. Mental status is at baseline.      ED Treatments / Results  Labs (all labs ordered are listed, but only abnormal results are displayed) Labs Reviewed  SARS CORONAVIRUS 2 ([facility]) - Abnormal;  Notable for the following components:      Result Value   SARS Coronavirus 2 POSITIVE (*)    All other components within normal limits  CBC WITH DIFFERENTIAL/PLATELET - Abnormal; Notable for the following components:   RBC 4.12 (*)    Hemoglobin 12.8 (*)    HCT 38.8 (*)    Platelets 468 (*)    Abs Immature Granulocytes 0.43 (*)    All other components within normal limits  COMPREHENSIVE METABOLIC PANEL - Abnormal; Notable for the following components:   Sodium 134 (*)    CO2 15 (*)    Glucose, Bld 229 (*)    Creatinine, Ser 1.43 (*)    Albumin 3.2 (*)    AST 55 (*)    ALT 51 (*)    GFR calc non Af Amer 45 (*)    GFR calc Af Amer 52 (*)    All other components within normal limits    EKG EKG Interpretation  Date/Time:  Sunday November 18 2018 15:13:53 EDT Ventricular Rate:  70 PR Interval:    QRS Duration: 105 QT Interval:  394 QTC Calculation: 426 R Axis:     Text Interpretation:  Sinus rhythm Prolonged PR interval Borderline repolarization abnormality Confirmed by Fredia Sorrow [phone]) on 11/18/2018 3:30:52 PM   Radiology Ct Chest W Contrast  Result Date: 11/18/2018 CLINICAL DATA:  Generalized weakness. Covered positive test. EXAM: CT CHEST WITH CONTRAST TECHNIQUE: Multidetector CT imaging of the chest was performed during intravenous contrast administration. CONTRAST:  29mL OMNIPAQUE IOHEXOL 300 MG/ML  SOLN COMPARISON:  Chest CT 09/08/2017 FINDINGS:  Cardiovascular: Normal heart size. No pericardial effusion. Coronary arterial and thoracic aortic vascular calcifications. Mediastinum/Nodes: No enlarged axillary, mediastinal or hilar lymphadenopathy. Mild wall thickening of the esophagus. Lungs/Pleura: Central airways are patent. Interval development of peripheral geographic areas of ground-glass attenuation involving lungs bilaterally, greater within the right upper, right middle and right lower lobes. No pleural effusion or pneumothorax. Upper Abdomen: Liver is diffusely low in attenuation compatible with steatosis. Gallbladder is unremarkable. Multiple small stones within the midpole of the right kidney. Musculoskeletal: Thoracic spine degenerative changes. No aggressive or acute appearing osseous lesions. IMPRESSION: 1. Interval development of peripheral geographic areas of ground-glass attenuation involving lungs bilaterally, greater within the right upper, right middle and right lower lobes. Findings or likely secondary to atypical/viral pneumonia. 2. Similar nonspecific wall thickening of the esophagus. 3. Hepatic steatosis. 4. Nonobstructing right nephrolithiasis. Aortic Atherosclerosis (ICD10-I70.0). Electronically Signed   By: Lovey Newcomer M.D.   On: 11/18/2018 20:24   Dg Chest Port 1 View  Result Date: 11/18/2018 CLINICAL DATA:  Generalized weakness. Loss of appetite. History of COVID-19 EXAM: PORTABLE CHEST 1 VIEW COMPARISON:  Chest radiograph September 10, 2017 FINDINGS: Monitoring leads overlie the patient. Stable cardiomegaly. Aortic atherosclerosis. Elevation right hemidiaphragm. Patchy peripheral consolidation within the right mid lower lung. No pleural effusion or pneumothorax. IMPRESSION: Peripheral consolidation within the right mid lower lung may represent pneumonia in the appropriate clinical setting. Followup PA and lateral chest X-ray is recommended in 3-4 weeks following trial of antibiotic therapy to ensure resolution and exclude underlying  malignancy. Electronically Signed   By: Lovey Newcomer M.D.   On: 11/18/2018 16:20    Procedures Procedures (including critical care time)  Medications Ordered in ED Medications  0.9 %  sodium chloride infusion ( Intravenous New Bag/Given 11/18/18 1700)  sodium chloride 0.9 % bolus 500 mL (0 mLs Intravenous Stopped 11/18/18 1811)  iohexol (OMNIPAQUE) 300 MG/ML solution 75  mL (75 mLs Intravenous Contrast Given 11/18/18 1954)     Initial Impression / Assessment and Plan / ED Course  I have reviewed the triage vital signs and the nursing notes.  Pertinent labs & imaging results that were available during my care of the patient were reviewed by me and considered in my medical decision making (see chart for details).        Repeat COVID test was positive.  Labs without significant abnormalities.  No hypoxia.  No significant tachypnea.  Patient's oxygen saturations have been in the upper 90s.  Patient was ambulated in the room never dropped his oxygen saturations.  Chest x-ray raise some concerns for mass.  Did not really show any distinct pneumonia.  So CT chest with contrast was done.  Which is showing evidence typical for multifocal pneumonia typical for the COVID-19 infection.  However patient not meeting criteria for admission at this time.  Will need very close follow-up in case he gets worse.  But he is 3 weeks out since original diagnosis.  We will not start him on steroids due to his diabetes.  Which will elevate his blood sugars even more.  Patient did feel better after IV fluid hydration here.  Patient stable for discharge home and close follow-up.  Final Clinical Impressions(s) / ED Diagnoses   Final diagnoses:  Pneumonia due to COVID-19 virus    ED Discharge Orders    None       Fredia Sorrow, MD 11/18/18 2113

## 2018-11-20 ENCOUNTER — Emergency Department (HOSPITAL_COMMUNITY): Payer: Medicare Other

## 2018-11-20 ENCOUNTER — Encounter (HOSPITAL_COMMUNITY): Payer: Self-pay | Admitting: Emergency Medicine

## 2018-11-20 ENCOUNTER — Other Ambulatory Visit: Payer: Self-pay

## 2018-11-20 ENCOUNTER — Observation Stay (HOSPITAL_COMMUNITY)
Admission: EM | Admit: 2018-11-20 | Discharge: 2018-11-21 | Disposition: A | Discharge status: Home / Self Care | Payer: Medicare Other | Attending: Internal Medicine | Admitting: Internal Medicine

## 2018-11-20 DIAGNOSIS — I252 Old myocardial infarction: Secondary | ICD-10-CM | POA: Insufficient documentation

## 2018-11-20 DIAGNOSIS — R079 Chest pain, unspecified: Secondary | ICD-10-CM | POA: Diagnosis not present

## 2018-11-20 DIAGNOSIS — E1122 Type 2 diabetes mellitus with diabetic chronic kidney disease: Secondary | ICD-10-CM

## 2018-11-20 DIAGNOSIS — E872 Acidosis: Secondary | ICD-10-CM | POA: Diagnosis not present

## 2018-11-20 DIAGNOSIS — J189 Pneumonia, unspecified organism: Secondary | ICD-10-CM | POA: Insufficient documentation

## 2018-11-20 DIAGNOSIS — R7989 Other specified abnormal findings of blood chemistry: Secondary | ICD-10-CM | POA: Diagnosis not present

## 2018-11-20 DIAGNOSIS — U071 COVID-19: Secondary | ICD-10-CM | POA: Diagnosis present

## 2018-11-20 DIAGNOSIS — Z79899 Other long term (current) drug therapy: Secondary | ICD-10-CM | POA: Diagnosis not present

## 2018-11-20 DIAGNOSIS — R001 Bradycardia, unspecified: Secondary | ICD-10-CM | POA: Diagnosis not present

## 2018-11-20 DIAGNOSIS — N400 Enlarged prostate without lower urinary tract symptoms: Secondary | ICD-10-CM | POA: Diagnosis not present

## 2018-11-20 DIAGNOSIS — I1310 Hypertensive heart and chronic kidney disease without heart failure, with stage 1 through stage 4 chronic kidney disease, or unspecified chronic kidney disease: Secondary | ICD-10-CM | POA: Insufficient documentation

## 2018-11-20 DIAGNOSIS — I440 Atrioventricular block, first degree: Secondary | ICD-10-CM | POA: Diagnosis not present

## 2018-11-20 DIAGNOSIS — N183 Chronic kidney disease, stage 3 (moderate): Secondary | ICD-10-CM | POA: Diagnosis not present

## 2018-11-20 DIAGNOSIS — R0789 Other chest pain: Principal | ICD-10-CM

## 2018-11-20 DIAGNOSIS — I214 Non-ST elevation (NSTEMI) myocardial infarction: Secondary | ICD-10-CM | POA: Diagnosis present

## 2018-11-20 DIAGNOSIS — Z7982 Long term (current) use of aspirin: Secondary | ICD-10-CM | POA: Insufficient documentation

## 2018-11-20 DIAGNOSIS — E785 Hyperlipidemia, unspecified: Secondary | ICD-10-CM | POA: Diagnosis not present

## 2018-11-20 DIAGNOSIS — Z8249 Family history of ischemic heart disease and other diseases of the circulatory system: Secondary | ICD-10-CM | POA: Insufficient documentation

## 2018-11-20 DIAGNOSIS — Z85828 Personal history of other malignant neoplasm of skin: Secondary | ICD-10-CM | POA: Diagnosis not present

## 2018-11-20 DIAGNOSIS — Z885 Allergy status to narcotic agent status: Secondary | ICD-10-CM | POA: Insufficient documentation

## 2018-11-20 DIAGNOSIS — E119 Type 2 diabetes mellitus without complications: Secondary | ICD-10-CM

## 2018-11-20 DIAGNOSIS — I25119 Atherosclerotic heart disease of native coronary artery with unspecified angina pectoris: Secondary | ICD-10-CM | POA: Diagnosis not present

## 2018-11-20 DIAGNOSIS — I2510 Atherosclerotic heart disease of native coronary artery without angina pectoris: Secondary | ICD-10-CM | POA: Diagnosis not present

## 2018-11-20 DIAGNOSIS — Z7984 Long term (current) use of oral hypoglycemic drugs: Secondary | ICD-10-CM | POA: Insufficient documentation

## 2018-11-20 DIAGNOSIS — M109 Gout, unspecified: Secondary | ICD-10-CM | POA: Diagnosis not present

## 2018-11-20 DIAGNOSIS — I10 Essential (primary) hypertension: Secondary | ICD-10-CM | POA: Diagnosis present

## 2018-11-20 LAB — COMPREHENSIVE METABOLIC PANEL
ALT: 43 U/L (ref 0–44)
AST: 41 U/L (ref 15–41)
Albumin: 3.1 g/dL — ABNORMAL LOW (ref 3.5–5.0)
Alkaline Phosphatase: 108 U/L (ref 38–126)
Anion gap: 12 (ref 5–15)
BUN: 26 mg/dL — ABNORMAL HIGH (ref 8–23)
CO2: 18 mmol/L — ABNORMAL LOW (ref 22–32)
Calcium: 9.1 mg/dL (ref 8.9–10.3)
Chloride: 105 mmol/L (ref 98–111)
Creatinine, Ser: 1.75 mg/dL — ABNORMAL HIGH (ref 0.61–1.24)
GFR calc Af Amer: 41 mL/min — ABNORMAL LOW (ref >60)
GFR calc non Af Amer: 35 mL/min — ABNORMAL LOW (ref >60)
Glucose, Bld: 245 mg/dL — ABNORMAL HIGH (ref 70–99)
Potassium: 4.1 mmol/L (ref 3.5–5.1)
Sodium: 135 mmol/L (ref 135–145)
Total Bilirubin: 0.9 mg/dL (ref 0.3–1.2)
Total Protein: 7 g/dL (ref 6.5–8.1)

## 2018-11-20 LAB — SEDIMENTATION RATE: Sed Rate: 72 mm/hr — ABNORMAL HIGH (ref 0–16)

## 2018-11-20 LAB — CBC WITH DIFFERENTIAL/PLATELET
Abs Immature Granulocytes: 0.27 10*3/uL — ABNORMAL HIGH (ref 0.00–0.07)
Basophils Absolute: 0.1 10*3/uL (ref 0.0–0.1)
Basophils Relative: 1 %
Eosinophils Absolute: 0.2 10*3/uL (ref 0.0–0.5)
Eosinophils Relative: 2 %
HCT: 37.7 % — ABNORMAL LOW (ref 39.0–52.0)
Hemoglobin: 12.1 g/dL — ABNORMAL LOW (ref 13.0–17.0)
Immature Granulocytes: 4 %
Lymphocytes Relative: 20 %
Lymphs Abs: 1.4 10*3/uL (ref 0.7–4.0)
MCH: 30.6 pg (ref 26.0–34.0)
MCHC: 32.1 g/dL (ref 30.0–36.0)
MCV: 95.4 fL (ref 80.0–100.0)
Monocytes Absolute: 0.6 10*3/uL (ref 0.1–1.0)
Monocytes Relative: 8 %
Neutro Abs: 4.7 10*3/uL (ref 1.7–7.7)
Neutrophils Relative %: 65 %
Platelets: 398 10*3/uL (ref 150–400)
RBC: 3.95 MIL/uL — ABNORMAL LOW (ref 4.22–5.81)
RDW: 14 % (ref 11.5–15.5)
WBC: 7.1 10*3/uL (ref 4.0–10.5)
nRBC: 0 % (ref 0.0–0.2)

## 2018-11-20 LAB — TROPONIN I (HIGH SENSITIVITY)
Troponin I (High Sensitivity): 603 ng/L (ref <18)
Troponin I (High Sensitivity): 518 ng/L (ref <18)

## 2018-11-20 LAB — C-REACTIVE PROTEIN: CRP: 2.4 mg/dL — ABNORMAL HIGH (ref <1.0)

## 2018-11-20 LAB — GLUCOSE, CAPILLARY: Glucose-Capillary: 129 mg/dL — ABNORMAL HIGH (ref 70–99)

## 2018-11-20 LAB — PROTIME-INR
INR: 1 (ref 0.8–1.2)
Prothrombin Time: 13.5 seconds (ref 11.4–15.2)

## 2018-11-20 LAB — SARS CORONAVIRUS 2 BY RT PCR (HOSPITAL ORDER, PERFORMED IN ~~LOC~~ HOSPITAL LAB): SARS Coronavirus 2: POSITIVE — AB

## 2018-11-20 LAB — D-DIMER, QUANTITATIVE: D-Dimer, Quant: 2.35 ug/mL-FEU — ABNORMAL HIGH (ref 0.00–0.50)

## 2018-11-20 LAB — APTT: aPTT: 27 seconds (ref 24–36)

## 2018-11-20 LAB — LACTATE DEHYDROGENASE: LDH: 151 U/L (ref 98–192)

## 2018-11-20 MED ORDER — SODIUM CHLORIDE 0.9 % IV SOLN
INTRAVENOUS | Status: AC
  Administered 2018-11-20 (×2): via INTRAVENOUS

## 2018-11-20 MED ORDER — SODIUM CHLORIDE 0.9% FLUSH: 3.0000 mL | INTRAVENOUS | Status: DC | PRN

## 2018-11-20 MED ORDER — ACETAMINOPHEN 325 MG PO TABS: 650.0000 mg | ORAL_TABLET | Freq: Four times a day (QID) | ORAL | Status: DC | PRN

## 2018-11-20 MED ORDER — DEXAMETHASONE SODIUM PHOSPHATE 10 MG/ML IJ SOLN
5.0000 mg | INTRAMUSCULAR | Status: DC
  Administered 2018-11-20: 5 mg via INTRAVENOUS
  Filled 2018-11-20 (×2): qty 1

## 2018-11-20 MED ORDER — AZITHROMYCIN 250 MG PO TABS
500.0000 mg | ORAL_TABLET | Freq: Once | ORAL | Status: AC
  Administered 2018-11-20: 500 mg via ORAL
  Filled 2018-11-20: qty 2

## 2018-11-20 MED ORDER — SODIUM CHLORIDE 0.9 % IV SOLN
500.0000 mg | INTRAVENOUS | Status: DC
  Filled 2018-11-20: qty 500

## 2018-11-20 MED ORDER — RAMIPRIL 10 MG PO CAPS
10.0000 mg | ORAL_CAPSULE | Freq: Two times a day (BID) | ORAL | Status: DC
  Administered 2018-11-20 – 2018-11-21 (×2): 10 mg via ORAL
  Filled 2018-11-20 (×2): qty 1

## 2018-11-20 MED ORDER — INSULIN ASPART 100 UNIT/ML ~~LOC~~ SOLN
0.0000 [IU] | SUBCUTANEOUS | Status: DC
  Administered 2018-11-20: 1 [IU] via SUBCUTANEOUS
  Administered 2018-11-21: 3 [IU] via SUBCUTANEOUS
  Administered 2018-11-21 (×2): 2 [IU] via SUBCUTANEOUS

## 2018-11-20 MED ORDER — HEPARIN (PORCINE) 25000 UT/250ML-% IV SOLN
1200.0000 [IU]/h | INTRAVENOUS | Status: DC
  Administered 2018-11-20 – 2018-11-21 (×2): 1200 [IU]/h via INTRAVENOUS
  Filled 2018-11-20 (×2): qty 250

## 2018-11-20 MED ORDER — LORATADINE 10 MG PO TABS
10.0000 mg | ORAL_TABLET | Freq: Every day | ORAL | Status: DC
  Administered 2018-11-21: 10 mg via ORAL
  Filled 2018-11-20: qty 1

## 2018-11-20 MED ORDER — SODIUM CHLORIDE 0.9% FLUSH
3.0000 mL | Freq: Two times a day (BID) | INTRAVENOUS | Status: DC
  Administered 2018-11-20 – 2018-11-21 (×2): 3 mL via INTRAVENOUS

## 2018-11-20 MED ORDER — HEPARIN BOLUS VIA INFUSION
4000.0000 [IU] | Freq: Once | INTRAVENOUS | Status: AC
  Administered 2018-11-20: 4000 [IU] via INTRAVENOUS

## 2018-11-20 MED ORDER — TAMSULOSIN HCL 0.4 MG PO CAPS
0.4000 mg | ORAL_CAPSULE | Freq: Every day | ORAL | Status: DC
  Administered 2018-11-20: 0.4 mg via ORAL
  Filled 2018-11-20: qty 1

## 2018-11-20 MED ORDER — ACETAMINOPHEN 650 MG RE SUPP: 650.0000 mg | Freq: Four times a day (QID) | RECTAL | Status: DC | PRN

## 2018-11-20 MED ORDER — ASPIRIN EC 81 MG PO TBEC
81.0000 mg | DELAYED_RELEASE_TABLET | Freq: Every day | ORAL | Status: DC
  Administered 2018-11-20: 81 mg via ORAL
  Filled 2018-11-20: qty 1

## 2018-11-20 MED ORDER — AMLODIPINE BESYLATE 5 MG PO TABS
5.0000 mg | ORAL_TABLET | Freq: Every day | ORAL | Status: DC
  Administered 2018-11-21: 5 mg via ORAL
  Filled 2018-11-20: qty 1

## 2018-11-20 MED ORDER — ASPIRIN 81 MG PO CHEW
324.0000 mg | CHEWABLE_TABLET | Freq: Once | ORAL | Status: AC
  Administered 2018-11-20: 324 mg via ORAL
  Filled 2018-11-20: qty 4

## 2018-11-20 MED ORDER — SODIUM CHLORIDE 0.9 % IV SOLN: 250.0000 mL | INTRAVENOUS | Status: DC | PRN

## 2018-11-20 MED ORDER — DM-GUAIFENESIN ER 30-600 MG PO TB12
1.0000 | ORAL_TABLET | Freq: Two times a day (BID) | ORAL | Status: DC
  Administered 2018-11-20 – 2018-11-21 (×2): 1 via ORAL
  Filled 2018-11-20 (×2): qty 1

## 2018-11-20 MED ORDER — AMOXICILLIN-POT CLAVULANATE 875-125 MG PO TABS
1.0000 | ORAL_TABLET | Freq: Once | ORAL | Status: AC
  Administered 2018-11-20: 1 via ORAL
  Filled 2018-11-20: qty 1

## 2018-11-20 MED ORDER — FLUTICASONE PROPIONATE 50 MCG/ACT NA SUSP
1.0000 | Freq: Every day | NASAL | Status: DC | PRN
  Filled 2018-11-20: qty 16

## 2018-11-20 MED ORDER — NITROGLYCERIN 2 % TD OINT
0.5000 [in_us] | TOPICAL_OINTMENT | Freq: Four times a day (QID) | TRANSDERMAL | Status: DC
  Administered 2018-11-20 – 2018-11-21 (×2): 0.5 [in_us] via TOPICAL
  Filled 2018-11-20: qty 1
  Filled 2018-11-20: qty 30

## 2018-11-20 MED ORDER — OMEGA-3-ACID ETHYL ESTERS 1 G PO CAPS
1.0000 g | ORAL_CAPSULE | Freq: Two times a day (BID) | ORAL | Status: DC
  Administered 2018-11-20 – 2018-11-21 (×2): 1 g via ORAL
  Filled 2018-11-20 (×2): qty 1

## 2018-11-20 MED ORDER — ALLOPURINOL 300 MG PO TABS
300.0000 mg | ORAL_TABLET | Freq: Every day | ORAL | Status: DC
  Administered 2018-11-21: 300 mg via ORAL
  Filled 2018-11-20: qty 1

## 2018-11-20 MED ORDER — DOCUSATE SODIUM 100 MG PO CAPS
100.0000 mg | ORAL_CAPSULE | Freq: Two times a day (BID) | ORAL | Status: DC
  Administered 2018-11-20 – 2018-11-21 (×2): 100 mg via ORAL
  Filled 2018-11-20 (×2): qty 1

## 2018-11-20 NOTE — ED Triage Notes (Signed)
CP tightness that radiates through between shoulder blades.

## 2018-11-20 NOTE — ED Provider Notes (Addendum)
[facility] EMERGENCY DEPARTMENT Provider Note   CSN: [id number] Arrival date & time: 11/20/18  1217     History   Chief Complaint Chief Complaint  Patient presents with  . Chest Pain    HPI Michael Cobb is a 84 y.o. male.     HPI  This patient is an 84 year old male, he has a known history of stage III chronic renal insufficiency, history of myocardial infarction, diabetes, hyperlipidemia and a history of recently being diagnosed with coronavirus.  He had a positive test 2 days ago.  He reported that he had had a positive test several weeks before as the person that was caring for him likely had it as well.  He had presented to the hospital 2 days ago during which time he was complaining about weakness and fatigue and loss of appetite and smell and taste.  He felt like he was gradually getting weaker.  He has had people bringing food to his house, he has a caregiver living with him but also is a professional caregiver and he feels well taken care of.  Per the notes in the medical record the patient was seen 2 days ago during which time he had a positive coronavirus test, he had a chest x-ray followed up by a CT scan which confirmed the presence of bilateral patchy pneumonia likely consistent with coronavirus.  His metabolic panel showed mild hyperglycemia, creatinine of 1.4, liver function test in the 50s and a sodium of 134.  His CBC showed no leukocytosis, hemoglobin of 12.8 and normal platelets.  He presents today with a complaint of chest discomfort, it is in the upper chest, it is a heavy feeling.  The patient states that it is worse with anybody touching the chest even putting blankets on his chest makes it worse, it is been present since yesterday.  He also has had mild itching on the center of his chest.  He denies any shortness of breath at rest, he denies coughing, he denies fevers, he reports that when he was here at the hospital the other day they did testing and states "they  did nothing for me" but when I asked him what they told him he gives me a very clear and accurate description that they told him he had coronavirus and that his symptoms were likely from that.  He denies dysuria, diarrhea, rectal bleeding, swelling of the legs and has no numbness or focal weakness though he does describe a generalized fatigue.  There are notes referenced in the medical record from his family doctor asking him to come back to the hospital today for evaluation of the chest discomfort  Past Medical History:  Diagnosis Date  . BPH (benign prostatic hyperplasia)   . Cancer (HCC)    Skin  . Chronic renal insufficiency, stage 3 (moderate) (HCC)   . Hyperlipidemia   . Hypertension   . Non-ST elevated myocardial infarction (non-STEMI) ([place])   . Syncope   . Type 2 diabetes mellitus [facility])     Patient Active Problem List   Diagnosis Date Noted  . Sepsis ([place]) 09/09/2017  . Generalized headaches 09/08/2017  . DDD (degenerative disc disease), cervical 09/08/2017  . DM2 (diabetes mellitus, type 2) ([place]) 09/08/2017  . Benign essential HTN 09/08/2017  . CAD (coronary artery disease) 07/11/2017  . History of non-ST elevation myocardial infarction (NSTEMI) 07/11/2017  . Leg pain, bilateral 07/11/2017  . Abnormal stress test     Past Surgical History:  Procedure Laterality Date  .  APPENDECTOMY    . BACK SURGERY    . LEFT HEART CATH AND CORONARY ANGIOGRAPHY N/A 06/27/2017   Procedure: LEFT HEART CATH AND CORONARY ANGIOGRAPHY;  Surgeon: Burnell Blanks, MD;  Location: [facility];  Service: Cardiovascular;  Laterality: N/A;  . SURGERY OF LIP          Home Medications    Prior to Admission medications   Medication Sig Start Date End Date Taking? Authorizing Provider  acetaminophen (TYLENOL) 500 MG tablet Take 500 mg by mouth every 6 (six) hours as needed for mild pain or moderate pain.   Yes [provider]  allopurinol (ZYLOPRIM) 300 MG tablet Take 300  mg by mouth daily.   Yes [provider]  amLODipine (NORVASC) 5 MG tablet TAKE ONE TABLET BY MOUTH EVERY DAY Patient taking differently: Take 5 mg by mouth daily.  09/10/18  Yes Satira Sark, MD  aspirin EC 81 MG tablet Take 81 mg by mouth at bedtime.    Yes [provider]  cetirizine (ZYRTEC) 10 MG tablet Take 10 mg by mouth at bedtime.    Yes [provider]  dextromethorphan-guaiFENesin (MUCINEX DM) 30-600 MG 12hr tablet Take 1 tablet by mouth 2 (two) times daily.   Yes [provider]  docusate sodium (COLACE) 100 MG capsule Take 100 mg by mouth 2 (two) times daily.   Yes [provider]  fluticasone (FLONASE) 50 MCG/ACT nasal spray Place 1 spray into both nostrils daily as needed for allergies or rhinitis.    Yes [provider]  furosemide (LASIX) 20 MG tablet Take 20 mg by mouth daily as needed for fluid.    Yes [provider]  glipiZIDE (GLUCOTROL) 10 MG tablet Take 10 mg by mouth 2 (two) times daily before a meal.  02/23/17  Yes [provider]  HYDROcodone-acetaminophen (NORCO/VICODIN) 5-325 MG tablet Take 1 tablet by mouth daily as needed for moderate pain.   Yes [provider]  metFORMIN (GLUCOPHAGE) 500 MG tablet Take 500 mg by mouth 2 (two) times daily with a meal.   Yes [provider]  Omega-3 Fatty Acids (FISH OIL PO) Take 1 tablet by mouth 2 (two) times daily.    Yes [provider]  ramipril (ALTACE) 10 MG capsule Take 10 mg by mouth 2 (two) times daily. 04/07/17  Yes [provider]  tamsulosin (FLOMAX) 0.4 MG CAPS capsule Take 0.4 mg by mouth at bedtime.    Yes [provider]    Family History Family History  Problem Relation Age of Onset  . Hypertension Father   . CVA Father     Social History Social History   Tobacco Use  . Smoking status: Never Smoker  . Smokeless tobacco: Never Used  Substance Use Topics  . Alcohol use: Never     Frequency: Never  . Drug use: Never     Allergies   Morphine and related   Review of Systems Review of Systems  All other systems reviewed and are negative.    Physical Exam Updated Vital Signs BP 107/60   Pulse 63   Temp 98.5 F (36.9 C) (Oral)   Resp 15   Ht 1.778 m (5\' 10" )   Wt 102.1 kg   SpO2 99%   BMI 32.28 kg/m   Physical Exam Vitals signs and nursing note reviewed.  Constitutional:      General: He is not in acute distress.    Appearance: He is well-developed.  HENT:     Head: Normocephalic and atraumatic.     Mouth/Throat:     Pharynx: No oropharyngeal exudate.     Comments: The pharynx is clear and moist, few teeth remain, no trismus or torticollis Eyes:     General: No scleral icterus.       Right eye: No discharge.        Left eye: No discharge.     Conjunctiva/sclera: Conjunctivae normal.     Pupils: Pupils are equal, round, and reactive to light.  Neck:     Musculoskeletal: Normal range of motion and neck supple.     Thyroid: No thyromegaly.     Vascular: No JVD.  Cardiovascular:     Rate and Rhythm: Normal rate and regular rhythm.     Heart sounds: Normal heart sounds. No murmur. No friction rub. No gallop.   Pulmonary:     Effort: Pulmonary effort is normal. No respiratory distress.     Breath sounds: Normal breath sounds. No wheezing or rales.     Comments: The lung sounds are clear, the oxygen level is 98% on room air, he is able to ambulate without dyspnea, speaks in full sentences, no accessory muscle use and no increased work of breathing. Abdominal:     General: Bowel sounds are normal. There is no distension.     Palpations: Abdomen is soft. There is no mass.     Tenderness: There is no abdominal tenderness.  Musculoskeletal: Normal range of motion.        General: No tenderness.  Lymphadenopathy:     Cervical: No cervical adenopathy.  Skin:    General: Skin is warm and dry.     Findings: No erythema or rash.  Neurological:      Mental Status: He is alert.     Coordination: Coordination normal.  Psychiatric:        Behavior: Behavior normal.      ED Treatments / Results  Labs (all labs ordered are listed, but only abnormal results are displayed) Labs Reviewed  CBC WITH DIFFERENTIAL/PLATELET - Abnormal; Notable for the following components:      Result Value   RBC 3.95 (*)    Hemoglobin 12.1 (*)    HCT 37.7 (*)    Abs Immature Granulocytes 0.27 (*)    All other components within normal limits  COMPREHENSIVE METABOLIC PANEL - Abnormal; Notable for the following components:   CO2 18 (*)    Glucose, Bld 245 (*)    BUN 26 (*)    Creatinine, Ser 1.75 (*)    Albumin 3.1 (*)    GFR calc non Af Amer 35 (*)    GFR calc Af Amer 41 (*)    All other components within normal limits  TROPONIN I (HIGH SENSITIVITY) - Abnormal; Notable for the following components:   Troponin I (High Sensitivity) 603 (*)    All other components within normal limits  TROPONIN I (HIGH SENSITIVITY)    EKG EKG Interpretation  Date/Time:  Tuesday November 20 2018 12:34:13 EDT Ventricular Rate:  79 PR Interval:    QRS Duration: 86 QT Interval:  354 QTC Calculation: 406 R Axis:   2 Text Interpretation:  Sinus rhythm Sinus pause Borderline repolarization abnormality since last tracing no significant change prior T wave abnormalities seen going back to 7/19 Confirmed by Noemi Chapel [phone]) on 11/20/2018 12:45:27 PM   Radiology Ct Chest W Contrast  Result Date: 11/18/2018 CLINICAL DATA:  Generalized weakness. Covered positive test.  EXAM: CT CHEST WITH CONTRAST TECHNIQUE: Multidetector CT imaging of the chest was performed during intravenous contrast administration. CONTRAST:  81mL OMNIPAQUE IOHEXOL 300 MG/ML  SOLN COMPARISON:  Chest CT 09/08/2017 FINDINGS: Cardiovascular: Normal heart size. No pericardial effusion. Coronary arterial and thoracic aortic vascular calcifications. Mediastinum/Nodes: No enlarged axillary, mediastinal or  hilar lymphadenopathy. Mild wall thickening of the esophagus. Lungs/Pleura: Central airways are patent. Interval development of peripheral geographic areas of ground-glass attenuation involving lungs bilaterally, greater within the right upper, right middle and right lower lobes. No pleural effusion or pneumothorax. Upper Abdomen: Liver is diffusely low in attenuation compatible with steatosis. Gallbladder is unremarkable. Multiple small stones within the midpole of the right kidney. Musculoskeletal: Thoracic spine degenerative changes. No aggressive or acute appearing osseous lesions. IMPRESSION: 1. Interval development of peripheral geographic areas of ground-glass attenuation involving lungs bilaterally, greater within the right upper, right middle and right lower lobes. Findings or likely secondary to atypical/viral pneumonia. 2. Similar nonspecific wall thickening of the esophagus. 3. Hepatic steatosis. 4. Nonobstructing right nephrolithiasis. Aortic Atherosclerosis (ICD10-I70.0). Electronically Signed   By: Lovey Newcomer M.D.   On: 11/18/2018 20:24   Dg Chest Port 1 View  Result Date: 11/20/2018 CLINICAL DATA:  Chest pain EXAM: PORTABLE CHEST 1 VIEW COMPARISON:  11/18/2018 FINDINGS: Stable cardiomediastinal contours. Calcific aortic knob. Mild patchy opacities within the periphery of the right lung, slightly less conspicuous compared to prior study. No new focal airspace consolidation. No pleural effusion. No pneumothorax. IMPRESSION: Mild peripheral opacities within the right lung appears slightly improved compared to prior. Electronically Signed   By: Davina Poke M.D.   On: 11/20/2018 13:06   Dg Chest Port 1 View  Result Date: 11/18/2018 CLINICAL DATA:  Generalized weakness. Loss of appetite. History of COVID-19 EXAM: PORTABLE CHEST 1 VIEW COMPARISON:  Chest radiograph September 10, 2017 FINDINGS: Monitoring leads overlie the patient. Stable cardiomegaly. Aortic atherosclerosis. Elevation right  hemidiaphragm. Patchy peripheral consolidation within the right mid lower lung. No pleural effusion or pneumothorax. IMPRESSION: Peripheral consolidation within the right mid lower lung may represent pneumonia in the appropriate clinical setting. Followup PA and lateral chest X-ray is recommended in 3-4 weeks following trial of antibiotic therapy to ensure resolution and exclude underlying malignancy. Electronically Signed   By: Lovey Newcomer M.D.   On: 11/18/2018 16:20    Procedures .Critical Care Performed by: Noemi Chapel, MD Authorized by: Noemi Chapel, MD   Critical care provider statement:    Critical care time (minutes):  35   Critical care time was exclusive of:  Separately billable procedures and treating other patients and teaching time   Critical care was necessary to treat or prevent imminent or life-threatening deterioration of the following conditions:  Cardiac failure   Critical care was time spent personally by me on the following activities:  Blood draw for specimens, development of treatment plan with patient or surrogate, discussions with consultants, evaluation of patient's response to treatment, examination of patient, obtaining history from patient or surrogate, ordering and performing treatments and interventions, ordering and review of laboratory studies, ordering and review of radiographic studies, pulse oximetry, re-evaluation of patient's condition and review of old charts   (including critical care time)  Medications Ordered in ED Medications  aspirin chewable tablet 324 mg (324 mg Oral Given 11/20/18 1242)  amoxicillin-clavulanate (AUGMENTIN) 875-125 MG per tablet 1 tablet (1 tablet Oral Given 11/20/18 1412)  azithromycin (ZITHROMAX) tablet 500 mg (500 mg Oral Given 11/20/18 1412)     Initial Impression /  Assessment and Plan / ED Course  I have reviewed the triage vital signs and the nursing notes.  Pertinent labs & imaging results that were available during my  care of the patient were reviewed by me and considered in my medical decision making (see chart for details).  Clinical Course as of Nov 20 1430  Tue Nov 20, 2018  1334 I have personally viewed the x-ray, it appears rather unremarkable without any significant collection of focal opacities.  According to the radiologist this is improved from the prior a couple of days ago.  Additionally the blood work was reviewed and shows no leukocytosis   [BM]  1426 The patient's laboratory work-up is significant for a troponin of over 600, this could be related to a primary ischemic process as he has known ischemic disease.  In reviewing his medical history it appears that he had a heart catheterization in April 2019 showing multivessel disease that was not amenable to intervention, medical therapy recommended   [BM]  1431 D/w Dr. Harl Bowie who will come to see patient - agreeable to heparin. - consider ischemic heart disease to be likely cause of new chest heaviness.   [BM]    Clinical Course User Index [BM] Noemi Chapel, MD      I have reviewed the patient's medical record extensively including his visit from the other day.  It appears that he does have most likely a COVID pneumonia however he is not having any difficulty with oxygenation.  We will need to look at his heart thus I will order an EKG and a troponin.  EKG was performed showing no signs of ST elevation or depression.  It does appear that he has a sinus rhythm with occasional sinus pause.  The patient may need to be started on antibiotics for possible bacterial component of pneumonia though he does not appear ill whatsoever and vital signs are extremely reassuring.    He is complaining of some itching on his chest however there is really no rash there, he does not appear to be in any distress from the itching and has no airway issues including swelling of the throat tongue or lips.  Michael Cobb was evaluated in Emergency Department on  11/20/2018 for the symptoms described in the history of present illness. He was evaluated in the context of the global COVID-19 pandemic, which necessitated consideration that the patient might be at risk for infection with the SARS-CoV-2 virus that causes COVID-19. Institutional protocols and algorithms that pertain to the evaluation of patients at risk for COVID-19 are in a state of rapid change based on information released by regulatory bodies including the CDC and federal and state organizations. These policies and algorithms were followed during the patient's care in the ED.   Final Clinical Impressions(s) / ED Diagnoses   Final diagnoses:  NSTEMI (non-ST elevated myocardial infarction) ([place])     Noemi Chapel, MD 11/20/18 1432    Noemi Chapel, MD 11/20/18 1436

## 2018-11-20 NOTE — Progress Notes (Addendum)
ANTICOAGULATION CONSULT NOTE - Initial Consult  Pharmacy Consult for heparin Indication: ACS/NSTEMI  Allergies  Allergen Reactions  . Morphine And Related Other (See Comments)    Hallucinate    Patient Measurements: Height: 5\' 10"  (177.8 cm) Weight: 225 lb (102.1 kg) IBW/kg (Calculated) : 73 Heparin Dosing Weight: 94 kg  Vital Signs: Temp: 98.5 F (36.9 C) (09/15 1234) Temp Source: Oral (09/15 1234) BP: 151/64 (09/15 1430) Pulse Rate: 61 (09/15 1430)  Labs: Recent Labs    11/18/18 1650 11/20/18 1244  HGB 12.8* 12.1*  HCT 38.8* 37.7*  PLT 468* 398  CREATININE 1.43* 1.75*  TROPONINIHS  --  603*    Estimated Creatinine Clearance: 38.9 mL/min (A) (by C-G formula based on SCr of 1.75 mg/dL (H)).   Medical History: Past Medical History:  Diagnosis Date  . BPH (benign prostatic hyperplasia)   . Cancer (HCC)    Skin  . Chronic renal insufficiency, stage 3 (moderate) (HCC)   . Hyperlipidemia   . Hypertension   . Non-ST elevated myocardial infarction (non-STEMI) ([place])   . Syncope   . Type 2 diabetes mellitus (HCC)     Medications:  (Not in a hospital admission)   Assessment: Pharmacy consulted to dose heparin in patient with ACS/STEMI- elevated troponin on admission. Patient is not on anticoagulation prior to admission.  Goal of Therapy:  Heparin level 0.3-0.7 units/ml Monitor platelets by anticoagulation protocol: Yes   Plan:  Give 4000 units bolus x 1 Start heparin infusion at 1200 units/hr Check anti-Xa level in 8 hours and daily while on heparin Continue to monitor H&H and platelets  Margot Ables, PharmD Clinical Pharmacist 11/20/2018 2:44 PM

## 2018-11-20 NOTE — Plan of Care (Signed)
  Problem: Education: Goal: Knowledge of risk factors and measures for prevention of condition will improve Outcome: Progressing   Problem: Respiratory: Goal: Will maintain a patent airway Outcome: Progressing Goal: Complications related to the disease process, condition or treatment will be avoided or minimized Outcome: Progressing   Problem: Activity: Goal: Ability to tolerate increased activity will improve Outcome: Progressing   Problem: Respiratory: Goal: Ability to maintain a clear airway will improve Outcome: Progressing

## 2018-11-20 NOTE — H&P (Addendum)
TRH H&P    Patient Demographics:    Michael Cobb, is a 84 y.o. male  MRN: [id number]  DOB - Jul 22, 1934  Admit Date - 11/20/2018  Referring MD/NP/PA:   Catalina Pizza  Outpatient Primary MD for the patient is Sandi Mealy, MD [facility] - cardiology [name] - cardiology  Patient coming from:  home  Chief complaint- chest pain   HPI:    Michael Cobb  is a 84 y.o. male, w hypertension, hyperlipidemia, Dm2, CKD stage3, CAD s/p NSTEMI 2019, Covid -19 positive, presents w c/o chest pain for the past 7 days.  Pt states substernal chest pressure, some in the back. Slight dyspnea.  Pt denies fever, chills, cough, palp, n/v, abd pain, diarrhea, brbpr, black stool, dysuria.   In ED,  T 98.5, P 82 R 21, Bp 120/72  Pox 97% on Ra Wt 102.1kg  CXR IMPRESSION: Mild peripheral opacities within the right lung appears slightly improved compared to prior.  Wbc 7.1, Hgb 12.1, Plt 398 Na 135, K 4.1, Bun 26, Creatinine 1.75, Alb 3.1,  Ast 41, Alt 43 Glucose 245, Hco3 18, AG 12 Trop 603-> 518 INR 1.0  ekg nsr at 80, nl axis, nl int, early R progression, T inversion in avl (new), compared to 09/11/2018 ekg, otherwise no significant change  Pt will be admitted for w/up of chest pain,   Review of cardiac catheterization 06/27/2017   Mid RCA lesion is 60% stenosed.  Prox RCA to Mid RCA lesion is 20% stenosed.  Dist RCA lesion is 30% stenosed.  Post Atrio lesion is 30% stenosed.  Mid LM to Ost LAD lesion is 50% stenosed.  Ost LAD to Prox LAD lesion is 20% stenosed.  Ost 1st Diag lesion is 99% stenosed.  Prox LAD lesion is 90% stenosed.  Prox LAD to Mid LAD lesion is 50% stenosed.  Mid LAD lesion is 80% stenosed.  Mid LAD to Dist LAD lesion is 90% stenosed.  Ost 1st Mrg to 1st Mrg lesion is 60% stenosed.  Prox Cx to Mid Cx lesion is 50% stenosed.  Ost 2nd Mrg to 2nd Mrg lesion is  99% stenosed.  Mid Cx to Dist Cx lesion is 99% stenosed.   1. Severe triple vessel CAD.  2. Heavily calcified LAD with severe stenosis extending from the proximal vessel into the distal vessel. Severe stenosis ostial small Diagonal branch.  3. Severe stenosis in the distal Circumflex at bifurcation with a small caliber obtuse marginal branch. The entire Circumflex is heavily calcified. The first OM branch has moderate disease.  4. Moderate stenosis mid RCA  Recommendations: He has severe CAD with heavily calcified vessels. The entire LAD is heavily calcified and diseased. His distal targets are not good for consideration of bypass. I would recommend medical management at this time. He is on an ASA, statin and Norvasc. May add Imdur and a beta blocker if he has chest pain. Fortunately, he has no chest pain. I will review films with the my interventional colleagues and then  [place] a plan which I  will discuss with Dr. Bronson Ing. He will be discharged home today after bedrest. His brothers funeral is tomorrow and he wishes to attend this.  Review of systems:    In addition to the HPI above,  No Fever-chills, No Headache, No changes with Vision or hearing, No problems swallowing food or Liquids, No Cough or Shortness of Breath, No Abdominal pain, No Nausea or Vomiting, bowel movements are regular, No Blood in stool or Urine, No dysuria, No new skin rashes or bruises, No new joints pains-aches,  No new weakness, tingling, numbness in any extremity, No recent weight gain or loss, No polyuria, polydypsia or polyphagia, No significant Mental Stressors.  All other systems reviewed and are negative.    Past History of the following :    Past Medical History:  Diagnosis Date   BPH (benign prostatic hyperplasia)    Cancer (HCC)    Skin   Chronic renal insufficiency, stage 3 (moderate) (HCC)    Hyperlipidemia    Hypertension    Non-ST elevated myocardial infarction (non-STEMI)  ([place])    Syncope    Type 2 diabetes mellitus ([place])       Past Surgical History:  Procedure Laterality Date   APPENDECTOMY     BACK SURGERY     LEFT HEART CATH AND CORONARY ANGIOGRAPHY N/A 06/27/2017   Procedure: LEFT HEART CATH AND CORONARY ANGIOGRAPHY;  Surgeon: Burnell Blanks, MD;  Location: [place] [facility];  Service: Cardiovascular;  Laterality: N/A;   SURGERY OF LIP        Social History:      Social History   Tobacco Use   Smoking status: Never Smoker   Smokeless tobacco: Never Used  Substance Use Topics   Alcohol use: Never    Frequency: Never       Family History :     Family History  Problem Relation Age of Onset   Hypertension Father    CVA Father        Home Medications:   Prior to Admission medications   Medication Sig Start Date End Date Taking? Authorizing Provider  acetaminophen (TYLENOL) 500 MG tablet Take 500 mg by mouth every 6 (six) hours as needed for mild pain or moderate pain.   Yes [provider]  allopurinol (ZYLOPRIM) 300 MG tablet Take 300 mg by mouth daily.   Yes [provider]  amLODipine (NORVASC) 5 MG tablet TAKE ONE TABLET BY MOUTH EVERY DAY Patient taking differently: Take 5 mg by mouth daily.  09/10/18  Yes Satira Sark, MD  aspirin EC 81 MG tablet Take 81 mg by mouth at bedtime.    Yes [provider]  cetirizine (ZYRTEC) 10 MG tablet Take 10 mg by mouth at bedtime.    Yes [provider]  dextromethorphan-guaiFENesin (MUCINEX DM) 30-600 MG 12hr tablet Take 1 tablet by mouth 2 (two) times daily.   Yes [provider]  docusate sodium (COLACE) 100 MG capsule Take 100 mg by mouth 2 (two) times daily.   Yes [provider]  fluticasone (FLONASE) 50 MCG/ACT nasal spray Place 1 spray into both nostrils daily as needed for allergies or rhinitis.    Yes [provider]  furosemide (LASIX) 20 MG tablet Take 20 mg by mouth daily as needed for fluid.     Yes [provider]  glipiZIDE (GLUCOTROL) 10 MG tablet Take 10 mg by mouth 2 (two) times daily before a meal.  02/23/17  Yes [provider]  HYDROcodone-acetaminophen (NORCO/VICODIN) 5-325 MG tablet Take 1 tablet by mouth daily as needed for moderate pain.   Yes [provider]  metFORMIN (GLUCOPHAGE) 500 MG tablet Take 500 mg by mouth 2 (two) times daily with a meal.   Yes [provider]  Omega-3 Fatty Acids (FISH OIL PO) Take 1 tablet by mouth 2 (two) times daily.    Yes [provider]  ramipril (ALTACE) 10 MG capsule Take 10 mg by mouth 2 (two) times daily. 04/07/17  Yes [provider]  tamsulosin (FLOMAX) 0.4 MG CAPS capsule Take 0.4 mg by mouth at bedtime.    Yes [provider]     Allergies:     Allergies  Allergen Reactions   Morphine And Related Other (See Comments)    Hallucinate     Physical Exam:   Vitals  Blood pressure (!) 142/74, pulse 66, temperature 98.5 F (36.9 C), temperature source Oral, resp. rate 19, height _0  (1.778 m), weight 102.1 kg, SpO2 99 %.  1.  General: axoxo3  2. Psychiatric: euthymic  3. Neurologic: cn2-12 intact, reflexes 2+ symmetric, diffuse with no clonus, motor 5/5 in all 4 ext  4. HEENMT:  Anicteric, pupils 1.63m symmetric, direct, consensual, near intact Neck: no jvd, no bruit, no tm  5. Respiratory : CTAB  6. Cardiovascular : rrr s1, s2, no m/g/r  7. Gastrointestinal:  Abd: soft, nt, nd, +bs  8. Skin:  Ext: no c/c/e,  No rash  9.Musculoskeletal:  Good ROM,  No adenopathy    Data Review:    CBC Recent Labs  Lab 11/18/18 1650 11/20/18 1244  WBC 7.5 7.1  HGB 12.8* 12.1*  HCT 38.8* 37.7*  PLT 468* 398  MCV 94.2 95.4  MCH 31.1 30.6  MCHC 33.0 32.1  RDW 13.7 14.0  LYMPHSABS 1.3 1.4  MONOABS 0.7 0.6  EOSABS 0.2 0.2  BASOSABS 0.1 0.1    ------------------------------------------------------------------------------------------------------------------  Results for orders placed or performed during the hospital encounter of 11/20/18 (from the past 48 hour(s))  Troponin I (High Sensitivity)     Status: Abnormal   Collection Time: 11/20/18 12:44 PM  Result Value Ref Range   Troponin I (High Sensitivity) 603 (HH) <18 ng/L    Comment: CRITICAL RESULT CALLED TO, READ BACK BY AND VERIFIED WITH: STIENHOAWARD,C AT 1405 ON 9.15.20 BY ISLEY,B (NOTE) Elevated high sensitivity troponin I (hsTnI) values and significant  changes across serial measurements may suggest ACS but many other  chronic and acute conditions are known to elevate hsTnI results.  Refer to the Links section for chest pain algorithms and additional  guidance. Performed at [facility] [address] [place] [address]  CBC with Differential/Platelet     Status: Abnormal   Collection Time: 11/20/18 12:44 PM  Result Value Ref Range   WBC 7.1 4.0 - 10.5 K/uL   RBC 3.95 (L) 4.22 - 5.81 MIL/uL   Hemoglobin 12.1 (L) 13.0 - 17.0 g/dL   HCT 37.7 (L) 39.0 - 52.0 %   MCV 95.4 80.0 - 100.0 fL   MCH 30.6 26.0 - 34.0 pg   MCHC 32.1 30.0 - 36.0 g/dL   RDW 14.0 11.5 - 15.5 %   Platelets 398 150 - 400 K/uL   nRBC 0.0 0.0 - 0.2 %   Neutrophils Relative % 65 %   Neutro Abs 4.7 1.7 - 7.7 K/uL   Lymphocytes Relative 20 %   Lymphs Abs 1.4 0.7 - 4.0 K/uL   Monocytes Relative  8 %   Monocytes Absolute 0.6 0.1 - 1.0 K/uL   Eosinophils Relative 2 %   Eosinophils Absolute 0.2 0.0 - 0.5 K/uL   Basophils Relative 1 %   Basophils Absolute 0.1 0.0 - 0.1 K/uL   Immature Granulocytes 4 %   Abs Immature Granulocytes 0.27 (H) 0.00 - 0.07 K/uL    Comment: Performed at [facility], [address], [place], [place] [address]  Comprehensive metabolic panel     Status: Abnormal   Collection Time: 11/20/18 12:44 PM  Result Value Ref Range   Sodium 135 135 - 145 mmol/L    Potassium 4.1 3.5 - 5.1 mmol/L   Chloride 105 98 - 111 mmol/L   CO2 18 (L) 22 - 32 mmol/L   Glucose, Bld 245 (H) 70 - 99 mg/dL   BUN 26 (H) 8 - 23 mg/dL   Creatinine, Ser 1.75 (H) 0.61 - 1.24 mg/dL   Calcium 9.1 8.9 - 10.3 mg/dL   Total Protein 7.0 6.5 - 8.1 g/dL   Albumin 3.1 (L) 3.5 - 5.0 g/dL   AST 41 15 - 41 U/L   ALT 43 0 - 44 U/L   Alkaline Phosphatase 108 38 - 126 U/L   Total Bilirubin 0.9 0.3 - 1.2 mg/dL   GFR calc non Af Amer 35 (L) >60 mL/min   GFR calc Af Amer 41 (L) >60 mL/min   Anion gap 12 5 - 15    Comment: Performed at [facility], [address], [place], [place] [address]  Troponin I (High Sensitivity)     Status: Abnormal   Collection Time: 11/20/18  2:55 PM  Result Value Ref Range   Troponin I (High Sensitivity) 518 (HH) <18 ng/L    Comment: CRITICAL RESULT CALLED TO, READ BACK BY AND VERIFIED WITH: MOORE,M AT 1551 ON 9.15.20 BY ISLEY,B (NOTE) Elevated high sensitivity troponin I (hsTnI) values and significant  changes across serial measurements may suggest ACS but many other  chronic and acute conditions are known to elevate hsTnI results.  Refer to the Links section for chest pain algorithms and additional  guidance. Performed at [facility], [address], [place], [place] [address]   Protime-INR     Status: None   Collection Time: 11/20/18  2:55 PM  Result Value Ref Range   Prothrombin Time 13.5 11.4 - 15.2 seconds   INR 1.0 0.8 - 1.2    Comment: (NOTE) INR goal varies based on device and disease states. Performed at [facility], [address], [place], [place] [address]   APTT     Status: None   Collection Time: 11/20/18  2:55 PM  Result Value Ref Range   aPTT 27 24 - 36 seconds    Comment: Performed at [facility], [address] [place], [place] [address]    Chemistries  Recent Labs  Lab 11/18/18 1650 11/20/18 1244  NA 134* 135  K 4.4 4.1  CL 105 105  CO2 15* 18*  GLUCOSE 229* 245*  BUN 22 26*  CREATININE 1.43* 1.75*  CALCIUM  9.5 9.1  AST 55* 41  ALT 51* 43  ALKPHOS 121 108  BILITOT 0.9 0.9   ------------------------------------------------------------------------------------------------------------------  ------------------------------------------------------------------------------------------------------------------ GFR: Estimated Creatinine Clearance: 38.9 mL/min (A) (by C-G formula based on SCr of 1.75 mg/dL (H)). Liver Function Tests: Recent Labs  Lab 11/18/18 1650 11/20/18 1244  AST 55* 41  ALT 51* 43  ALKPHOS 121 108  BILITOT 0.9 0.9  PROT 7.5 7.0  ALBUMIN 3.2*  3.1*   No results for input(s): LIPASE, AMYLASE in the last 168 hours. No results for input(s): AMMONIA in the last 168 hours. Coagulation Profile: Recent Labs  Lab 11/20/18 1455  INR 1.0   Cardiac Enzymes: No results for input(s): CKTOTAL, CKMB, CKMBINDEX, TROPONINI in the last 168 hours. BNP (last 3 results) No results for input(s): PROBNP in the last 8760 hours. HbA1C: No results for input(s): HGBA1C in the last 72 hours. CBG: No results for input(s): GLUCAP in the last 168 hours. Lipid Profile: No results for input(s): CHOL, HDL, LDLCALC, TRIG, CHOLHDL, LDLDIRECT in the last 72 hours. Thyroid Function Tests: No results for input(s): TSH, T4TOTAL, FREET4, T3FREE, THYROIDAB in the last 72 hours. Anemia Panel: No results for input(s): VITAMINB12, FOLATE, FERRITIN, TIBC, IRON, RETICCTPCT in the last 72 hours.  --------------------------------------------------------------------------------------------------------------- Urine analysis: No results found for: COLORURINE, APPEARANCEUR, LABSPEC, PHURINE, GLUCOSEU, HGBUR, BILIRUBINUR, KETONESUR, PROTEINUR, UROBILINOGEN, NITRITE, LEUKOCYTESUR    Imaging Results:    Ct Chest W Contrast  Result Date: 11/18/2018 CLINICAL DATA:  Generalized weakness. Covered positive test. EXAM: CT CHEST WITH CONTRAST TECHNIQUE: Multidetector CT imaging of the chest was performed during  intravenous contrast administration. CONTRAST:  38m OMNIPAQUE IOHEXOL 300 MG/ML  SOLN COMPARISON:  Chest CT 09/08/2017 FINDINGS: Cardiovascular: Normal heart size. No pericardial effusion. Coronary arterial and thoracic aortic vascular calcifications. Mediastinum/Nodes: No enlarged axillary, mediastinal or hilar lymphadenopathy. Mild wall thickening of the esophagus. Lungs/Pleura: Central airways are patent. Interval development of peripheral geographic areas of ground-glass attenuation involving lungs bilaterally, greater within the right upper, right middle and right lower lobes. No pleural effusion or pneumothorax. Upper Abdomen: Liver is diffusely low in attenuation compatible with steatosis. Gallbladder is unremarkable. Multiple small stones within the midpole of the right kidney. Musculoskeletal: Thoracic spine degenerative changes. No aggressive or acute appearing osseous lesions. IMPRESSION: 1. Interval development of peripheral geographic areas of ground-glass attenuation involving lungs bilaterally, greater within the right upper, right middle and right lower lobes. Findings or likely secondary to atypical/viral pneumonia. 2. Similar nonspecific wall thickening of the esophagus. 3. Hepatic steatosis. 4. Nonobstructing right nephrolithiasis. Aortic Atherosclerosis (ICD10-I70.0). Electronically Signed   By: DLovey NewcomerM.D.   On: 11/18/2018 20:24   Dg Chest Port 1 View  Result Date: 11/20/2018 CLINICAL DATA:  Chest pain EXAM: PORTABLE CHEST 1 VIEW COMPARISON:  11/18/2018 FINDINGS: Stable cardiomediastinal contours. Calcific aortic knob. Mild patchy opacities within the periphery of the right lung, slightly less conspicuous compared to prior study. No new focal airspace consolidation. No pleural effusion. No pneumothorax. IMPRESSION: Mild peripheral opacities within the right lung appears slightly improved compared to prior. Electronically Signed   By: NDavina PokeM.D.   On: 11/20/2018 13:06        Assessment & Plan:    Principal Problem:   Chest pain Active Problems:   CAD (coronary artery disease)   DM2 (diabetes mellitus, type 2) (HCC)   Benign essential HTN   NSTEMI (non-ST elevated myocardial infarction) (HCC)  Chest pain Tele Trop I q3h x2 Check hga1c, lipid Check cardiac echo Cont Aspirin  Cont Ramipril 121mpo qday Cont Amlodipine 23m47mo qday Statin intolerant Consider Repatha Heparin gtt [email or web address] infection/ pneumonia Check esr, crp cpk, procalcitonin, d dimer, lactic acid, IL-6 zithromax 500m79m qday Dexamethasone 23mg 30mqday  Dm2 w CKD stage 3 STOP Metformin due to Creatinine 1.75 fsbs q4h, ISS  Non AG acidosis ?  Secondary to metformin   BPH Cont Flomax 04.gm po qhs  Gout Cont Allopurinol 336m po qday    DVT Prophylaxis-   Heparin gtt - SCDs  AM Labs Ordered, also please review Full Orders  Family Communication: Admission, patients condition and plan of care including tests being ordered have been discussed with the patient  who indicate understanding and agree with the plan and Code Status.  Code Status:  FULL CODE per patient, notified daughter "MBeckie Busing [facility]left message, no pickup)  Admission status: Observation/: Based on patients clinical presentation and evaluation of above clinical data, I have made determination that patient meets observationcriteria at this time. Pt may require inpatient stay depending upon cardiology recommendations.   Time spent in minutes : 70 minutes   JJani GravelM.D on 11/20/2018 at 5:30 PM

## 2018-11-20 NOTE — ED Notes (Signed)
ED TO INPATIENT HANDOFF REPORT  ED Nurse Name and Phone #: [phone]  S Name/Age/Gender [name] 84 y.o. male Room/Bed: APA10/APA10  Code Status   Code Status: Prior  Home/SNF/Other Home Patient oriented to: self, place, time and situation Is this baseline? Yes   Triage Complete: Triage complete  Chief Complaint hurting in chest  Triage Note CP tightness that radiates through between shoulder blades.     Allergies Allergies  Allergen Reactions  . Morphine And Related Other (See Comments)    Hallucinate    Level of Care/Admitting Diagnosis ED Disposition    ED Disposition Condition [facility] Area: [place] [100100]  Level of Care: Progressive [102]  I expect the patient will be discharged within 24 hours: No (not a candidate for 5C-Observation unit)  Covid Evaluation: Confirmed COVID Positive  Diagnosis: NSTEMI (non-ST elevated myocardial infarction) [id number]  Admitting Physician: Jani Gravel [[id number]]  Attending Physician: Jani Gravel [[id number]]  PT Class (Do Not Modify): Observation [104]  PT Acc Code (Do Not Modify): Observation [10022]       B Medical/Surgery History Past Medical History:  Diagnosis Date  . BPH (benign prostatic hyperplasia)   . Cancer (HCC)    Skin  . Chronic renal insufficiency, stage 3 (moderate) (HCC)   . Hyperlipidemia   . Hypertension   . Non-ST elevated myocardial infarction (non-STEMI) ([place])   . Syncope   . Type 2 diabetes mellitus ([place])    Past Surgical History:  Procedure Laterality Date  . APPENDECTOMY    . BACK SURGERY    . LEFT HEART CATH AND CORONARY ANGIOGRAPHY N/A 06/27/2017   Procedure: LEFT HEART CATH AND CORONARY ANGIOGRAPHY;  Surgeon: Burnell Blanks, MD;  Location: [facility];  Service: Cardiovascular;  Laterality: N/A;  . SURGERY OF LIP       A IV Location/Drains/Wounds Patient Lines/Drains/Airways Status   Active Line/Drains/Airways    Name:    Placement date:   Placement time:   Site:   Days:   Peripheral IV 11/20/18 Left Antecubital   11/20/18    1239    Antecubital   less than 1   Peripheral IV 11/20/18 Right Wrist   11/20/18    1501    Wrist   less than 1          Intake/Output Last 24 hours No intake or output data in the 24 hours ending 11/20/18 1627  Labs/Imaging Results for orders placed or performed during the hospital encounter of 11/20/18 (from the past 48 hour(s))  Troponin I (High Sensitivity)     Status: Abnormal   Collection Time: 11/20/18 12:44 PM  Result Value Ref Range   Troponin I (High Sensitivity) 603 (HH) <18 ng/L    Comment: CRITICAL RESULT CALLED TO, READ BACK BY AND VERIFIED WITH: STIENHOAWARD,C AT 1405 ON 9.15.20 BY ISLEY,B (NOTE) Elevated high sensitivity troponin I (hsTnI) values and significant  changes across serial measurements may suggest ACS but many other  chronic and acute conditions are known to elevate hsTnI results.  Refer to the Links section for chest pain algorithms and additional  guidance. Performed at [facility], [address], [place], [place] [address]   CBC with Differential/Platelet     Status: Abnormal   Collection Time: 11/20/18 12:44 PM  Result Value Ref Range   WBC 7.1 4.0 - 10.5 K/uL   RBC 3.95 (L) 4.22 - 5.81 MIL/uL   Hemoglobin 12.1 (L) 13.0 -  17.0 g/dL   HCT 37.7 (L) 39.0 - 52.0 %   MCV 95.4 80.0 - 100.0 fL   MCH 30.6 26.0 - 34.0 pg   MCHC 32.1 30.0 - 36.0 g/dL   RDW 14.0 11.5 - 15.5 %   Platelets 398 150 - 400 K/uL   nRBC 0.0 0.0 - 0.2 %   Neutrophils Relative % 65 %   Neutro Abs 4.7 1.7 - 7.7 K/uL   Lymphocytes Relative 20 %   Lymphs Abs 1.4 0.7 - 4.0 K/uL   Monocytes Relative 8 %   Monocytes Absolute 0.6 0.1 - 1.0 K/uL   Eosinophils Relative 2 %   Eosinophils Absolute 0.2 0.0 - 0.5 K/uL   Basophils Relative 1 %   Basophils Absolute 0.1 0.0 - 0.1 K/uL   Immature Granulocytes 4 %   Abs Immature Granulocytes 0.27 (H) 0.00 - 0.07 K/uL    Comment:  Performed at [facility], [address], [place], [place] [address]  Comprehensive metabolic panel     Status: Abnormal   Collection Time: 11/20/18 12:44 PM  Result Value Ref Range   Sodium 135 135 - 145 mmol/L   Potassium 4.1 3.5 - 5.1 mmol/L   Chloride 105 98 - 111 mmol/L   CO2 18 (L) 22 - 32 mmol/L   Glucose, Bld 245 (H) 70 - 99 mg/dL   BUN 26 (H) 8 - 23 mg/dL   Creatinine, Ser 1.75 (H) 0.61 - 1.24 mg/dL   Calcium 9.1 8.9 - 10.3 mg/dL   Total Protein 7.0 6.5 - 8.1 g/dL   Albumin 3.1 (L) 3.5 - 5.0 g/dL   AST 41 15 - 41 U/L   ALT 43 0 - 44 U/L   Alkaline Phosphatase 108 38 - 126 U/L   Total Bilirubin 0.9 0.3 - 1.2 mg/dL   GFR calc non Af Amer 35 (L) >60 mL/min   GFR calc Af Amer 41 (L) >60 mL/min   Anion gap 12 5 - 15    Comment: Performed at [facility], [address], [place], [place] [address]  Troponin I (High Sensitivity)     Status: Abnormal   Collection Time: 11/20/18  2:55 PM  Result Value Ref Range   Troponin I (High Sensitivity) 518 (HH) <18 ng/L    Comment: CRITICAL RESULT CALLED TO, READ BACK BY AND VERIFIED WITH: MOORE,M AT 1551 ON 9.15.20 BY ISLEY,B (NOTE) Elevated high sensitivity troponin I (hsTnI) values and significant  changes across serial measurements may suggest ACS but many other  chronic and acute conditions are known to elevate hsTnI results.  Refer to the Links section for chest pain algorithms and additional  guidance. Performed at [facility], [address], [place], [place] [address]   Protime-INR     Status: None   Collection Time: 11/20/18  2:55 PM  Result Value Ref Range   Prothrombin Time 13.5 11.4 - 15.2 seconds   INR 1.0 0.8 - 1.2    Comment: (NOTE) INR goal varies based on device and disease states. Performed at [facility], [address] [place], [place] [address]   APTT     Status: None   Collection Time: 11/20/18  2:55 PM  Result Value Ref Range   aPTT 27 24 - 36 seconds    Comment: Performed at [facility], [address] [place], [place] [address]   Ct Chest W Contrast  Result Date: 11/18/2018 CLINICAL DATA:  Generalized weakness. Covered positive test. EXAM: CT CHEST WITH CONTRAST  TECHNIQUE: Multidetector CT imaging of the chest was performed during intravenous contrast administration. CONTRAST:  76mL OMNIPAQUE IOHEXOL 300 MG/ML  SOLN COMPARISON:  Chest CT 09/08/2017 FINDINGS: Cardiovascular: Normal heart size. No pericardial effusion. Coronary arterial and thoracic aortic vascular calcifications. Mediastinum/Nodes: No enlarged axillary, mediastinal or hilar lymphadenopathy. Mild wall thickening of the esophagus. Lungs/Pleura: Central airways are patent. Interval development of peripheral geographic areas of ground-glass attenuation involving lungs bilaterally, greater within the right upper, right middle and right lower lobes. No pleural effusion or pneumothorax. Upper Abdomen: Liver is diffusely low in attenuation compatible with steatosis. Gallbladder is unremarkable. Multiple small stones within the midpole of the right kidney. Musculoskeletal: Thoracic spine degenerative changes. No aggressive or acute appearing osseous lesions. IMPRESSION: 1. Interval development of peripheral geographic areas of ground-glass attenuation involving lungs bilaterally, greater within the right upper, right middle and right lower lobes. Findings or likely secondary to atypical/viral pneumonia. 2. Similar nonspecific wall thickening of the esophagus. 3. Hepatic steatosis. 4. Nonobstructing right nephrolithiasis. Aortic Atherosclerosis (ICD10-I70.0). Electronically Signed   By: Lovey Newcomer M.D.   On: 11/18/2018 20:24   Dg Chest Port 1 View  Result Date: 11/20/2018 CLINICAL DATA:  Chest pain EXAM: PORTABLE CHEST 1 VIEW COMPARISON:  11/18/2018 FINDINGS: Stable cardiomediastinal contours. Calcific aortic knob. Mild patchy opacities within the periphery of the right lung, slightly less conspicuous compared to prior study. No new focal  airspace consolidation. No pleural effusion. No pneumothorax. IMPRESSION: Mild peripheral opacities within the right lung appears slightly improved compared to prior. Electronically Signed   By: Davina Poke M.D.   On: 11/20/2018 13:06    Pending Labs Unresulted Labs (From admission, onward)    Start     Ordered   11/21/18 0500  CBC  Daily,   R     11/20/18 1448   11/20/18 2300  Heparin level (unfractionated)  Once-Timed,   STAT     11/20/18 1448          Vitals/Pain Today's Vitals   11/20/18 1509 11/20/18 1530 11/20/18 1600 11/20/18 1615  BP: (!) 142/67 (!) 153/67 (!) 141/65   Pulse: 60 60 61   Resp: 16 13 18    Temp:      TempSrc:      SpO2: 100% 99% 98%   Weight:      Height:      PainSc:    4     Isolation Precautions No active isolations  Medications Medications  heparin ADULT infusion 100 units/mL (25000 units/252mL sodium chloride 0.45%) (1,200 Units/hr Intravenous New Bag/Given 11/20/18 1506)  nitroGLYCERIN (NITROGLYN) 2 % ointment 0.5 inch (0.5 inches Topical Given 11/20/18 1547)  aspirin chewable tablet 324 mg (324 mg Oral Given 11/20/18 1242)  amoxicillin-clavulanate (AUGMENTIN) 875-125 MG per tablet 1 tablet (1 tablet Oral Given 11/20/18 1412)  azithromycin (ZITHROMAX) tablet 500 mg (500 mg Oral Given 11/20/18 1412)  heparin bolus via infusion 4,000 Units (4,000 Units Intravenous Bolus from Bag 11/20/18 1507)    Mobility walks with device Moderate fall risk   Focused Assessments    R Recommendations: See Admitting Provider Note  Report given to:   Additional Notes:

## 2018-11-20 NOTE — ED Notes (Signed)
Date and time results received: 11/20/18 2:05 PM   Test: troponin  Critical Value: 603 Name of Provider Notified: Dr Sabra Heck   Orders Received? Or Actions Taken?:

## 2018-11-20 NOTE — ED Notes (Signed)
Date and time results received: 11/20/18 1554 (use smartphrase ".now" to insert current time)  Test: Troponin Critical Value: 518  Name of Provider Notified: Dr Maudie Mercury  Orders Received? Or Actions Taken?:NA

## 2018-11-20 NOTE — Consult Note (Signed)
Cardiology Consultation:   Patient ID: Michael Cobb MRN: [id number]; DOB: May 15, 1934  Admit date: 11/20/2018 Date of Consult: 11/20/2018  Primary Care Provider: Sandi Mealy, MD Primary Cardiologist: [name], MD  Primary Electrophysiologist:  None    Patient Profile:   Michael Cobb is a 84 y.o. male with a hx of CAD and recent COVID 19 infection who is being seen today for the evaluation of chest pain at the request of Dr Laverta Baltimore.  History of Present Illness:   Michael Cobb 84 yo male history of CAD with cath 06/2017 as reported below, essentially severe triple vessel disease with heavily calcified LAD from prox into distal vessel, severe stenosis ostial small diag, severe stenosis distal LCX with entire LCX heavily calficied, moderate RCA disease. Distal targets not amenable to bypass, recs for medical therapy. He is statin intolerant  Notes indicate COVID + started 3 weeks ago. He has been self quarantining. Repeat COVID test 9/13 was positive.   Also history of CKD 3, DM2, HL.   Presents with chest pain. Upper chest tightness worst with position and deep breathing. Chest wall tender to palpation. Even with blanket on this area at night significant discomfort. No other associated symptoms.    ER vitals: T 98.5 p 82 bp 120/72 97% RA WBC 7.1 Hgb 12.1 Plt 398 K 4.1 Cr 1.75 BUN 26 hstrop 603--> CXR mild peripheral opacities 11/18/18 CT chest: interval development of peripheral ground glass attenuation, likely atypical pneumonia EKG SR, no acute ischemic changes   Heart Pathway Score:     Past Medical History:  Diagnosis Date  . BPH (benign prostatic hyperplasia)   . Cancer (HCC)    Skin  . Chronic renal insufficiency, stage 3 (moderate) (HCC)   . Hyperlipidemia   . Hypertension   . Non-ST elevated myocardial infarction (non-STEMI) ([place])   . Syncope   . Type 2 diabetes mellitus ([place])     Past Surgical History:  Procedure Laterality Date  . APPENDECTOMY     . BACK SURGERY    . LEFT HEART CATH AND CORONARY ANGIOGRAPHY N/A 06/27/2017   Procedure: LEFT HEART CATH AND CORONARY ANGIOGRAPHY;  Surgeon: Burnell Blanks, MD;  Location: [place] [facility];  Service: Cardiovascular;  Laterality: N/A;  . SURGERY OF LIP       Inpatient Medications: Scheduled Meds:  Continuous Infusions:  PRN Meds:   Allergies:    Allergies  Allergen Reactions  . Morphine And Related Other (See Comments)    Hallucinate    Social History:   Social History   Socioeconomic History  . Marital status: Divorced    Spouse name: Not on file  . Number of children: Not on file  . Years of education: Not on file  . Highest education level: Not on file  Occupational History  . Not on file  Social Needs  . Financial resource strain: Not on file  . Food insecurity    Worry: Not on file    Inability: Not on file  . Transportation needs    Medical: Not on file    Non-medical: Not on file  Tobacco Use  . Smoking status: Never Smoker  . Smokeless tobacco: Never Used  Substance and Sexual Activity  . Alcohol use: Never    Frequency: Never  . Drug use: Never  . Sexual activity: Not on file  Lifestyle  . Physical activity    Days per week: Not on file    Minutes per session: Not  on file  . Stress: Not on file  Relationships  . Social Herbalist on phone: Not on file    Gets together: Not on file    Attends religious service: Not on file    Active member of club or organization: Not on file    Attends meetings of clubs or organizations: Not on file    Relationship status: Not on file  . Intimate partner violence    Fear of current or ex partner: Not on file    Emotionally abused: Not on file    Physically abused: Not on file    Forced sexual activity: Not on file  Other Topics Concern  . Not on file  Social History Narrative  . Not on file    Family History:    Family History  Problem Relation Age of Onset  . Hypertension Father    . CVA Father      ROS:  Please see the history of present illness.  All other ROS reviewed and negative.     Physical Exam/Data:   Vitals:   11/20/18 1236 11/20/18 1300 11/20/18 1330 11/20/18 1400  BP:  105/63 (!) 127/52 107/60  Pulse:  73 65 63  Resp:  14 16 15   Temp:      TempSrc:      SpO2:  98% 97% 99%  Weight: 102.1 kg     Height: 5\' 10"  (1.778 m)      No intake or output data in the 24 hours ending 11/20/18 1431 Last 3 Weights 11/20/2018 11/18/2018 09/11/2018  Weight (lbs) 225 lb 212 lb 236 lb  Weight (kg) 102.059 kg 96.163 kg 107.049 kg     Body mass index is 32.28 kg/m.  General:  Well nourished, well developed, in no acute distress HEENT: normal Lymph: no adenopathy Neck: no JVD Endocrine:  No thryomegaly Vascular: No carotid bruits; FA pulses 2+ bilaterally without bruits  Cardiac:  normal S1, S2; RRR; no murmur  Lungs:  clear to auscultation bilaterally, no wheezing, rhonchi or rales  Abd: soft, nontender, no hepatomegaly  Ext: no edema Musculoskeletal:  No deformities, BUE and BLE strength normal and equal Skin: warm and dry  Neuro:  CNs 2-12 intact, no focal abnormalities noted Psych:  Normal affect     Laboratory Data:  High Sensitivity Troponin:   Recent Labs  Lab 11/20/18 1244  TROPONINIHS 603*     Chemistry Recent Labs  Lab 11/18/18 1650 11/20/18 1244  NA 134* 135  K 4.4 4.1  CL 105 105  CO2 15* 18*  GLUCOSE 229* 245*  BUN 22 26*  CREATININE 1.43* 1.75*  CALCIUM 9.5 9.1  GFRNONAA 45* 35*  GFRAA 52* 41*  ANIONGAP 14 12    Recent Labs  Lab 11/18/18 1650 11/20/18 1244  PROT 7.5 7.0  ALBUMIN 3.2* 3.1*  AST 55* 41  ALT 51* 43  ALKPHOS 121 108  BILITOT 0.9 0.9   Hematology Recent Labs  Lab 11/18/18 1650 11/20/18 1244  WBC 7.5 7.1  RBC 4.12* 3.95*  HGB 12.8* 12.1*  HCT 38.8* 37.7*  MCV 94.2 95.4  MCH 31.1 30.6  MCHC 33.0 32.1  RDW 13.7 14.0  PLT 468* 398   BNPNo results for input(s): BNP, PROBNP in the last 168  hours.  DDimer No results for input(s): DDIMER in the last 168 hours.   Radiology/Studies:  Ct Chest W Contrast  Result Date: 11/18/2018 CLINICAL DATA:  Generalized weakness. Covered positive test. EXAM: CT  CHEST WITH CONTRAST TECHNIQUE: Multidetector CT imaging of the chest was performed during intravenous contrast administration. CONTRAST:  46mL OMNIPAQUE IOHEXOL 300 MG/ML  SOLN COMPARISON:  Chest CT 09/08/2017 FINDINGS: Cardiovascular: Normal heart size. No pericardial effusion. Coronary arterial and thoracic aortic vascular calcifications. Mediastinum/Nodes: No enlarged axillary, mediastinal or hilar lymphadenopathy. Mild wall thickening of the esophagus. Lungs/Pleura: Central airways are patent. Interval development of peripheral geographic areas of ground-glass attenuation involving lungs bilaterally, greater within the right upper, right middle and right lower lobes. No pleural effusion or pneumothorax. Upper Abdomen: Liver is diffusely low in attenuation compatible with steatosis. Gallbladder is unremarkable. Multiple small stones within the midpole of the right kidney. Musculoskeletal: Thoracic spine degenerative changes. No aggressive or acute appearing osseous lesions. IMPRESSION: 1. Interval development of peripheral geographic areas of ground-glass attenuation involving lungs bilaterally, greater within the right upper, right middle and right lower lobes. Findings or likely secondary to atypical/viral pneumonia. 2. Similar nonspecific wall thickening of the esophagus. 3. Hepatic steatosis. 4. Nonobstructing right nephrolithiasis. Aortic Atherosclerosis (ICD10-I70.0). Electronically Signed   By: Lovey Newcomer M.D.   On: 11/18/2018 20:24   Dg Chest Port 1 View  Result Date: 11/20/2018 CLINICAL DATA:  Chest pain EXAM: PORTABLE CHEST 1 VIEW COMPARISON:  11/18/2018 FINDINGS: Stable cardiomediastinal contours. Calcific aortic knob. Mild patchy opacities within the periphery of the right lung,  slightly less conspicuous compared to prior study. No new focal airspace consolidation. No pleural effusion. No pneumothorax. IMPRESSION: Mild peripheral opacities within the right lung appears slightly improved compared to prior. Electronically Signed   By: Davina Poke M.D.   On: 11/20/2018 13:06   Dg Chest Port 1 View  Result Date: 11/18/2018 CLINICAL DATA:  Generalized weakness. Loss of appetite. History of COVID-19 EXAM: PORTABLE CHEST 1 VIEW COMPARISON:  Chest radiograph September 10, 2017 FINDINGS: Monitoring leads overlie the patient. Stable cardiomegaly. Aortic atherosclerosis. Elevation right hemidiaphragm. Patchy peripheral consolidation within the right mid lower lung. No pleural effusion or pneumothorax. IMPRESSION: Peripheral consolidation within the right mid lower lung may represent pneumonia in the appropriate clinical setting. Followup PA and lateral chest X-ray is recommended in 3-4 weeks following trial of antibiotic therapy to ensure resolution and exclude underlying malignancy. Electronically Signed   By: Lovey Newcomer M.D.   On: 11/18/2018 16:20    Assessment and Plan:   1. CAD/Chest pain - 06/2017 cath as reported above, severe 3 vessel disease not amenable to revasc - his chest pain is not typical for ischemia. Tightness upper chest worst with breathing and position, left chest wall tender to palpation. Uncomfortable even with just bed blanket laying on upper chest.  - initial trop 600 in setting of COVID infection. Diagnosis 3 weeks ago however remains + by testing just 2 days ago, CT at that time showed signs of atypical pneumonia - given is anatomy from prior cath poor candidate for repeat cath in the absence of severe refractory symptoms or clinical instability. He also has significant renal diasease and active COVID infection. His symptoms are atypical and not convincing for ACS - more likely demand ischemia in setting of severe obstructive disease and active covid infection,  perhaps some pleuritic pain   Follow trop trends. Would start anticoag initially as we follow trops. No plans for cath at this time. I think even if this happened to be an ACS based on his known anatomy and medical comorbidities would be managed medically in the absence of medical instability.    Defer disposition for patient  to medicine team. We do not have plans for cath at this time.     For questions or updates, please contact [place] Please consult [email or web address] for contact info under     Signed, Carlyle Dolly, MD  11/20/2018 2:31 PM

## 2018-11-21 ENCOUNTER — Other Ambulatory Visit (HOSPITAL_COMMUNITY): Payer: Medicare Other

## 2018-11-21 DIAGNOSIS — R079 Chest pain, unspecified: Secondary | ICD-10-CM | POA: Diagnosis not present

## 2018-11-21 DIAGNOSIS — I10 Essential (primary) hypertension: Secondary | ICD-10-CM

## 2018-11-21 DIAGNOSIS — U071 COVID-19: Secondary | ICD-10-CM | POA: Diagnosis not present

## 2018-11-21 DIAGNOSIS — I214 Non-ST elevation (NSTEMI) myocardial infarction: Secondary | ICD-10-CM | POA: Diagnosis not present

## 2018-11-21 DIAGNOSIS — I25119 Atherosclerotic heart disease of native coronary artery with unspecified angina pectoris: Secondary | ICD-10-CM

## 2018-11-21 LAB — GLUCOSE, CAPILLARY
Glucose-Capillary: 153 mg/dL — ABNORMAL HIGH (ref 70–99)
Glucose-Capillary: 193 mg/dL — ABNORMAL HIGH (ref 70–99)
Glucose-Capillary: 225 mg/dL — ABNORMAL HIGH (ref 70–99)

## 2018-11-21 LAB — HEPARIN LEVEL (UNFRACTIONATED)
Heparin Unfractionated: 0.37 IU/mL (ref 0.30–0.70)
Heparin Unfractionated: 0.57 IU/mL (ref 0.30–0.70)

## 2018-11-21 LAB — CK TOTAL AND CKMB (NOT AT ARMC)
CK, MB: 4.5 ng/mL (ref 0.5–5.0)
Relative Index: INVALID (ref 0.0–2.5)
Total CK: 51 U/L (ref 49–397)

## 2018-11-21 MED ORDER — ISOSORBIDE MONONITRATE ER 30 MG PO TB24
30.0000 mg | ORAL_TABLET | Freq: Every day | ORAL | Status: DC
  Administered 2018-11-21: 30 mg via ORAL
  Filled 2018-11-21: qty 1

## 2018-11-21 MED ORDER — ISOSORBIDE MONONITRATE ER 30 MG PO TB24: 30.0000 mg | ORAL_TABLET | Freq: Every day | ORAL | 0 refills | Status: DC

## 2018-11-21 MED ORDER — AMLODIPINE BESYLATE 10 MG PO TABS: 10.0000 mg | ORAL_TABLET | Freq: Every day | ORAL | Status: DC

## 2018-11-21 MED ORDER — AMLODIPINE BESYLATE 10 MG PO TABS: 10.0000 mg | ORAL_TABLET | Freq: Every day | ORAL | 0 refills | Status: DC

## 2018-11-21 NOTE — Progress Notes (Addendum)
   Vital Signs MEWS/VS Documentation      11/21/2018 0448 11/21/2018 0623 11/21/2018 0649 11/21/2018 0656   MEWS Score:  0  2  2  2    MEWS Score Color:  Green  Yellow  Yellow  Yellow   BP:  -  -  -  (!) 150/72   Temp:  -  -  -  97.7 F (36.5 C)   O2 Device:  -  -  -  Room Air   Level of Consciousness:  Alert  -  - Alert           Hillary Bow 11/21/2018,7:00 AM Patient is asymptomatics, VS noted in chart, hr non sustaining in the 30-40s, Kirby,NP paged to ask if NTG paste can be held for this am. Kirby,np paged again to inform of new MEWS score of 2 related to HR. No complaints of pain or discomfort. Pt admitted for CP with elevated trops, on heparin gtt at 1200 units. Will inform am RN of charges and to monitor MEWS

## 2018-11-21 NOTE — Progress Notes (Signed)
Cardiology Progress Note  Patient ID: NIVAAN FUHR MRN: [id number] DOB: April 26, 1934 Date of Encounter: 11/21/2018  Primary Cardiologist: [name], MD  Subjective  Reports chest pain has resolved.  Review of telemetry shows intermittent Wenckebach, there is no evidence of complete heart block or high-grade AV block.  He denies symptoms of lightheaded or dizziness.  ROS:  All other ROS reviewed and negative. Pertinent positives noted in the HPI.     Inpatient Medications  Scheduled Meds: . allopurinol  300 mg Oral Daily  . [START ON 11/22/2018] amLODipine  10 mg Oral Daily  . aspirin EC  81 mg Oral QHS  . dexamethasone (DECADRON) injection  5 mg Intravenous Q24H  . dextromethorphan-guaiFENesin  1 tablet Oral BID  . docusate sodium  100 mg Oral BID  . insulin aspart  0-9 Units Subcutaneous Q4H  . isosorbide mononitrate  30 mg Oral Daily  . loratadine  10 mg Oral Daily  . nitroGLYCERIN  0.5 inch Topical Q6H  . omega-3 acid ethyl esters  1 g Oral BID  . ramipril  10 mg Oral BID  . sodium chloride flush  3 mL Intravenous Q12H  . tamsulosin  0.4 mg Oral QHS   Continuous Infusions: . sodium chloride    . azithromycin     PRN Meds: sodium chloride, acetaminophen **OR** acetaminophen, fluticasone, sodium chloride flush   Vital Signs   Vitals:   11/21/18 0330 11/21/18 0656 11/21/18 0801 11/21/18 0900  BP: (!) 140/56 (!) 150/72  (!) 189/75  Pulse: 60  (!) 51   Resp: 16   15  Temp: 98.4 F (36.9 C) 97.7 F (36.5 C) 97.8 F (36.6 C) 97.8 F (36.6 C)  TempSrc: Axillary Oral Axillary Axillary  SpO2: 97%   96%  Weight:      Height:        Intake/Output Summary (Last 24 hours) at 11/21/2018 0948 Last data filed at 11/21/2018 0900 Gross per 24 hour  Intake 950.84 ml  Output 925 ml  Net 25.84 ml   Last 3 Weights 11/20/2018 11/20/2018 11/18/2018  Weight (lbs) 210 lb 8.6 oz 225 lb 212 lb  Weight (kg) 95.5 kg 102.059 kg 96.163 kg      Telemetry  Overnight telemetry  shows sinus bradycardia, intermittent winky block with rates in the 40-50 range, which I personally reviewed.   ECG  The most recent ECG shows normal sinus rhythm, heart rate 79, first-degree AV block noted, likely a Wenckebach, left axis deviation, nonspecific T wave changes, no change from prior, which I personally reviewed.   Physical Exam   Vitals:   11/21/18 0330 11/21/18 0656 11/21/18 0801 11/21/18 0900  BP: (!) 140/56 (!) 150/72  (!) 189/75  Pulse: 60  (!) 51   Resp: 16   15  Temp: 98.4 F (36.9 C) 97.7 F (36.5 C) 97.8 F (36.6 C) 97.8 F (36.6 C)  TempSrc: Axillary Oral Axillary Axillary  SpO2: 97%   96%  Weight:      Height:         Intake/Output Summary (Last 24 hours) at 11/21/2018 0948 Last data filed at 11/21/2018 0900 Gross per 24 hour  Intake 950.84 ml  Output 925 ml  Net 25.84 ml    Last 3 Weights 11/20/2018 11/20/2018 11/18/2018  Weight (lbs) 210 lb 8.6 oz 225 lb 212 lb  Weight (kg) 95.5 kg 102.059 kg 96.163 kg    Body mass index is 30.21 kg/m.  General: Well nourished, well developed, in no  acute distress Heart: Atraumatic, normal size  Eyes: PEERLA, EOMI  Neck: Supple, no JVD Endocrine: No thryomegaly Cardiac: Normal S1, S2; RRR; no murmurs, rubs, or gallops Lungs: Clear to auscultation bilaterally, no wheezing, rhonchi or rales  Abd: Soft, nontender, no hepatomegaly  Ext: No edema, pulses 2+ Musculoskeletal: No deformities, BUE and BLE strength normal and equal Skin: Warm and dry, no rashes   Neuro: Alert and oriented to person, place, time, and situation, CNII-XII grossly intact, no focal deficits  Psych: Normal mood and affect   Labs  High Sensitivity Troponin:   Recent Labs  Lab 11/20/18 1244 11/20/18 1455  TROPONINIHS 603* 518*     Cardiac EnzymesNo results for input(s): TROPONINI in the last 168 hours. No results for input(s): TROPIPOC in the last 168 hours.  Chemistry Recent Labs  Lab 11/18/18 1650 11/20/18 1244  NA 134* 135  K 4.4  4.1  CL 105 105  CO2 15* 18*  GLUCOSE 229* 245*  BUN 22 26*  CREATININE 1.43* 1.75*  CALCIUM 9.5 9.1  PROT 7.5 7.0  ALBUMIN 3.2* 3.1*  AST 55* 41  ALT 51* 43  ALKPHOS 121 108  BILITOT 0.9 0.9  GFRNONAA 45* 35*  GFRAA 52* 41*  ANIONGAP 14 12    Hematology Recent Labs  Lab 11/18/18 1650 11/20/18 1244  WBC 7.5 7.1  RBC 4.12* 3.95*  HGB 12.8* 12.1*  HCT 38.8* 37.7*  MCV 94.2 95.4  MCH 31.1 30.6  MCHC 33.0 32.1  RDW 13.7 14.0  PLT 468* 398   BNPNo results for input(s): BNP, PROBNP in the last 168 hours.  DDimer  Recent Labs  Lab 11/20/18 1838  DDIMER 2.35*     Radiology  Dg Chest Port 1 View  Result Date: 11/20/2018 CLINICAL DATA:  Chest pain EXAM: PORTABLE CHEST 1 VIEW COMPARISON:  11/18/2018 FINDINGS: Stable cardiomediastinal contours. Calcific aortic knob. Mild patchy opacities within the periphery of the right lung, slightly less conspicuous compared to prior study. No new focal airspace consolidation. No pleural effusion. No pneumothorax. IMPRESSION: Mild peripheral opacities within the right lung appears slightly improved compared to prior. Electronically Signed   By: Davina Poke M.D.   On: 11/20/2018 13:06   Patient Profile  Michael Cobb is a 84 y.o. male with with history of CAD, extensive three-vessel coronary artery disease without targets for either PCI or CABG, CKD, hypertension, hyperlipidemia, recent COVID infection admitted 9/15 with chest pain.  Assessment & Plan  #Chest pain, elevated high-sensitivity troponin -No meaningful rise to suggest this is an acute coronary syndrome -No change in EKG from prior -The pain he describes as pleuritic, which is inconsistent with cardiac chest pain -He says he is had this pain since his diagnosis of pneumonia -Overall, he does not have a diagnosis of an acute coronary syndrome -Given how extensive his coronary disease is, he will always have an elevated troponin with fluctuations in blood pressure or  volume status. -Blood pressure is quite elevated today, and I suspect this had a role in this as well -I have increased his amlodipine to 10 mg daily please discharge him on this -I have added 30 mg of Imdur once daily please discharge him on this -Okay to continue his ACE inhibitor as an outpatient -Please continue with aspirin and statin  #First-degree AV block, sinus bradycardia, intermittent Wenckebach -I have reviewed his telemetry, there is no evidence of high-grade AV block or complete heart block -He is not on a beta-blocker as an outpatient,  and I suspect this is related to his sinus bradycardia -If a beta-blocker was chosen for antianginal therapy, he would qualify for a pacemaker, but there is no indication for this at this time.  I think we can send him home and he can discuss this further with his primary cardiologist. -For now we will hold any AV nodal agents  [facility] will sign off.   Medication Recommendations: Amlodipine 10 mg, Imdur 30 mg daily, continue home ACE inhibitor, continue home aspirin statin Other recommendations (labs, testing, etc): None Follow up as an outpatient: We will arrange follow-up in 2 to 3 weeks with his primary cardiologist  For questions or updates, please contact [place] Please consult [email or web address] for contact info under        Signed, Lake Bells T. Audie Box, [place]  11/21/2018 9:48 AM

## 2018-11-21 NOTE — Progress Notes (Signed)
Patient at this time having non sustaining SB in the 30s-50s, no complaints of discomfort and is asymptomatic. Kirby,NP paged to verify if the 0600 dose of NTG paste needs to be admin. NTG dose held at this time

## 2018-11-21 NOTE — Progress Notes (Addendum)
Confirmed cardiology will see patient this am, appreciate input.

## 2018-11-21 NOTE — Progress Notes (Signed)
ANTICOAGULATION CONSULT NOTE - Follow-up Consult  Pharmacy Consult for Heparin Indication: ACS/NSTEMI  Allergies  Allergen Reactions  . Morphine And Related Other (See Comments)    Hallucinate    Patient Measurements: Height: 5\' 10"  (177.8 cm) Weight: 210 lb 8.6 oz (95.5 kg) IBW/kg (Calculated) : 73 Heparin Dosing Weight: 94 kg  Vital Signs: Temp: 98 F (36.7 C) (09/15 2338) Temp Source: Oral (09/15 2338) BP: 131/96 (09/15 2338) Pulse Rate: 79 (09/15 2338)  Labs: Recent Labs    11/18/18 1650 11/20/18 1244 11/20/18 1455 11/20/18 1838 11/21/18 0039  HGB 12.8* 12.1*  --   --   --   HCT 38.8* 37.7*  --   --   --   PLT 468* 398  --   --   --   APTT  --   --  27  --   --   LABPROT  --   --  13.5  --   --   INR  --   --  1.0  --   --   HEPARINUNFRC  --   --   --   --  0.37  CREATININE 1.43* 1.75*  --   --   --   CKTOTAL  --   --   --  51  --   CKMB  --   --   --  PENDING  --   TROPONINIHS  --  603* 518*  --   --     Estimated Creatinine Clearance: 37.7 mL/min (A) (by C-G formula based on SCr of 1.75 mg/dL (H)).   Assessment: Pharmacy consulted to dose heparin in patient with ACS/STEMI- elevated troponin on admission. Patient is not on anticoagulation prior to admission.  Heparin level therapeutic (0.37) on gtt at 1200 units/hr. No bleeding noted.   Goal of Therapy:  Heparin level 0.3-0.7 units/ml Monitor platelets by anticoagulation protocol: Yes   Plan:  Continue heparin at 1200 units/hr Will f/u daily heparin level  Sherlon Handing, PharmD, BCPS 11/21/2018 1:25 AM

## 2018-11-21 NOTE — Discharge Summary (Addendum)
Physician Discharge Summary  Michael Cobb [id number] DOB: [birth year] DOA: 11/20/2018  PCP: Sandi Mealy, MD  Admit date: 11/20/2018 Discharge date: 11/21/2018  Admitted From: Home Disposition:  Home   Recommendations for Outpatient Follow-up and new medication changes:  1. Follow up with Dr. Lorra Hals in 7 days.  2. Patient has been placed on isosorbide mononitrate and amlodipine has been increased to 10 mg.   Home Health: no   Equipment/Devices: no    Discharge Condition: stable  CODE STATUS: full  Diet recommendation: heart healthy and diabetic prudent.   Brief/Interim Summary: 84 year old male who presented with chest pain.  He does have significant past medical history for hypertension, dyslipidemia, type II diabetes mellitus, chronic kidney disease stage III, severe coronary artery disease, and a recent diagnosis of SARS COVID-19 (8/23), who presents with 7 days of chest pain.  Substernal chest pain, associated with slight dyspnea.  On his initial physical examination temperature 98.5, heart rate 82, respiratory rate 21, blood pressure 120/72, oxygen saturation 97% on room air.  His lungs were clear to auscultation bilaterally, heart S1-S2 present, rhythmic and regular, abdomen soft, no lower extremity edema. Sodium 135, potassium 4.1 chloride 105 bicarb 18, glucose 245, BUN 26, creatinine 1.75, troponin 603, white count 7.1, hemoglobin 12.1, hematocrit 37.7, platelets 398.  SARS COVID-19 positive.  His chest radiograph had righ hemidiaphragm elevation, right base interstitial infiltrate.  EKG 79 bpm, normal axis, normal intervals, sinus rhythm, no ST segment or T wave changes, positive for blocked PACs.  Patient was admitted to the hospital with a working diagnosis of atypical chest pain to rule out acute coronary syndrome  1. Atypical chest pain in the setting of recent viral COVID 19 pneumonia/patient ruled out for acute coronary syndrome. Pain initially described as  pleuritic, Ct chest personally reviewed from 09/03, infiltrates subpleural. EKG with no ischemic changes, troponin high sensitivity trending down [phone] in the setting of reduced GFR per serum cr. Patient likely has pleuritic chest pain from recovering COVID 19 pneumonia. Will continue pain control with acetaminophen.   His pulmonary infiltrates are improving, no dyspnea, oxygen saturation percent on room air.  Patient not candidate for systemic steroids or from this severe.  Would recommend follow-up as an outpatient, continue airborne precautions including facial mask in public spaces and maintain physical distancing.   2. Coronary artery disease/ CATH from 2019 with sever triple vessel disease. Patient with heavy calcified LAD, severe stenosis of LAD and circumflex, moderate stenosis of RCA. Patient with recent COVID 19 infection, high risk for arterial thrombosis, patient was placed on IV heparin. Continue aspirin and ramipril. Will hold on beta blocker due to bradycardia. Patient will follow up as outpatient with cardiology.   3.  Bradycardia, intermittent second-degree type I Wenckebach/first-degree AV block.  Patient remained stable, continue to avoid AV nodal blocking agents.  4. HTN.  Uncontrolled blood pressure, amlodipine has been increased to 10 mg, isosorbide was added.  Continue ACE inhibitor.  5. T2DM with dyslipidemia.  Patient will continue glipizide and metformin.  Continue statin therapy.  6. CKD stage 3. Base serum cr 1,5. Today's renal function with serum cr at 1,75 with K at 4,1 and serum bicarbonate at 18.  Resume furosemide at discharge.    Discharge Diagnoses:  Principal Problem:   Chest pain Active Problems:   CAD (coronary artery disease)   DM2 (diabetes mellitus, type 2) (HCC)   Benign essential HTN   NSTEMI (non-ST elevated myocardial infarction) ([place])   COVID-19 virus  infection    Discharge Instructions   Allergies as of 11/21/2018      Reactions    Morphine And Related Other (See Comments)   Hallucinate      Medication List    TAKE these medications   acetaminophen 500 MG tablet Commonly known as: TYLENOL Take 500 mg by mouth every 6 (six) hours as needed for mild pain or moderate pain.   allopurinol 300 MG tablet Commonly known as: ZYLOPRIM Take 300 mg by mouth daily.   amLODipine 10 MG tablet Commonly known as: NORVASC Take 1 tablet (10 mg total) by mouth daily. Start taking on: November 22, 2018 What changed:   medication strength  how much to take   aspirin EC 81 MG tablet Take 81 mg by mouth at bedtime.   cetirizine 10 MG tablet Commonly known as: ZYRTEC Take 10 mg by mouth at bedtime.   dextromethorphan-guaiFENesin 30-600 MG 12hr tablet Commonly known as: MUCINEX DM Take 1 tablet by mouth 2 (two) times daily.   docusate sodium 100 MG capsule Commonly known as: COLACE Take 100 mg by mouth 2 (two) times daily.   FISH OIL PO Take 1 tablet by mouth 2 (two) times daily.   fluticasone 50 MCG/ACT nasal spray Commonly known as: FLONASE Place 1 spray into both nostrils daily as needed for allergies or rhinitis.   furosemide 20 MG tablet Commonly known as: LASIX Take 20 mg by mouth daily as needed for fluid.   glipiZIDE 10 MG tablet Commonly known as: GLUCOTROL Take 10 mg by mouth 2 (two) times daily before a meal.   HYDROcodone-acetaminophen 5-325 MG tablet Commonly known as: NORCO/VICODIN Take 1 tablet by mouth daily as needed for moderate pain.   isosorbide mononitrate 30 MG 24 hr tablet Commonly known as: IMDUR Take 1 tablet (30 mg total) by mouth daily.   metFORMIN 500 MG tablet Commonly known as: GLUCOPHAGE Take 500 mg by mouth 2 (two) times daily with a meal.   ramipril 10 MG capsule Commonly known as: ALTACE Take 10 mg by mouth 2 (two) times daily.   tamsulosin 0.4 MG Caps capsule Commonly known as: FLOMAX Take 0.4 mg by mouth at bedtime.       Allergies  Allergen Reactions  .  Morphine And Related Other (See Comments)    Hallucinate    Consultations:  Cardiology    Procedures/Studies: Ct Chest W Contrast  Result Date: 11/18/2018 CLINICAL DATA:  Generalized weakness. Covered positive test. EXAM: CT CHEST WITH CONTRAST TECHNIQUE: Multidetector CT imaging of the chest was performed during intravenous contrast administration. CONTRAST:  42mL OMNIPAQUE IOHEXOL 300 MG/ML  SOLN COMPARISON:  Chest CT 09/08/2017 FINDINGS: Cardiovascular: Normal heart size. No pericardial effusion. Coronary arterial and thoracic aortic vascular calcifications. Mediastinum/Nodes: No enlarged axillary, mediastinal or hilar lymphadenopathy. Mild wall thickening of the esophagus. Lungs/Pleura: Central airways are patent. Interval development of peripheral geographic areas of ground-glass attenuation involving lungs bilaterally, greater within the right upper, right middle and right lower lobes. No pleural effusion or pneumothorax. Upper Abdomen: Liver is diffusely low in attenuation compatible with steatosis. Gallbladder is unremarkable. Multiple small stones within the midpole of the right kidney. Musculoskeletal: Thoracic spine degenerative changes. No aggressive or acute appearing osseous lesions. IMPRESSION: 1. Interval development of peripheral geographic areas of ground-glass attenuation involving lungs bilaterally, greater within the right upper, right middle and right lower lobes. Findings or likely secondary to atypical/viral pneumonia. 2. Similar nonspecific wall thickening of the esophagus. 3. Hepatic steatosis. 4. Nonobstructing  right nephrolithiasis. Aortic Atherosclerosis (ICD10-I70.0). Electronically Signed   By: Lovey Newcomer M.D.   On: 11/18/2018 20:24   Dg Chest Port 1 View  Result Date: 11/20/2018 CLINICAL DATA:  Chest pain EXAM: PORTABLE CHEST 1 VIEW COMPARISON:  11/18/2018 FINDINGS: Stable cardiomediastinal contours. Calcific aortic knob. Mild patchy opacities within the periphery of  the right lung, slightly less conspicuous compared to prior study. No new focal airspace consolidation. No pleural effusion. No pneumothorax. IMPRESSION: Mild peripheral opacities within the right lung appears slightly improved compared to prior. Electronically Signed   By: Davina Poke M.D.   On: 11/20/2018 13:06   Dg Chest Port 1 View  Result Date: 11/18/2018 CLINICAL DATA:  Generalized weakness. Loss of appetite. History of COVID-19 EXAM: PORTABLE CHEST 1 VIEW COMPARISON:  Chest radiograph September 10, 2017 FINDINGS: Monitoring leads overlie the patient. Stable cardiomegaly. Aortic atherosclerosis. Elevation right hemidiaphragm. Patchy peripheral consolidation within the right mid lower lung. No pleural effusion or pneumothorax. IMPRESSION: Peripheral consolidation within the right mid lower lung may represent pneumonia in the appropriate clinical setting. Followup PA and lateral chest X-ray is recommended in 3-4 weeks following trial of antibiotic therapy to ensure resolution and exclude underlying malignancy. Electronically Signed   By: Lovey Newcomer M.D.   On: 11/18/2018 16:20      Procedures:   Subjective: Patient is feeling better, no significant chest pain, no nausea or vomiting, no dyspnea or cough.   Discharge Exam: Vitals:   11/21/18 0801 11/21/18 0900  BP:  (!) 189/75  Pulse: (!) 51   Resp:  15  Temp: 97.8 F (36.6 C) 97.8 F (36.6 C)  SpO2:  96%   Vitals:   11/21/18 0330 11/21/18 0656 11/21/18 0801 11/21/18 0900  BP: (!) 140/56 (!) 150/72  (!) 189/75  Pulse: 60  (!) 51   Resp: 16   15  Temp: 98.4 F (36.9 C) 97.7 F (36.5 C) 97.8 F (36.6 C) 97.8 F (36.6 C)  TempSrc: Axillary Oral Axillary Axillary  SpO2: 97%   96%  Weight:      Height:        General: Not in pain or dyspnea  Neurology: Awake and alert, non focal  E ENT: no pallor, no icterus, oral mucosa moist Cardiovascular: No JVD. S1-S2 present, rhythmic, no gallops, rubs, or murmurs. No lower extremity  edema. Pulmonary: positive  breath sounds bilaterally, adequate air movement, no wheezing, rhonchi or rales. Gastrointestinal. Abdomen with no organomegaly, non tender, no rebound or guarding Skin. No rashes Musculoskeletal: no joint deformities   The results of significant diagnostics from this hospitalization (including imaging, microbiology, ancillary and laboratory) are listed below for reference.     Microbiology: Recent Results (from the past 240 hour(s))  SARS Coronavirus 2 [facility] order, Performed in [facility] hospital lab) Nasopharyngeal Nasopharyngeal Swab     Status: Abnormal   Collection Time: 11/18/18  4:35 PM   Specimen: Nasopharyngeal Swab  Result Value Ref Range Status   SARS Coronavirus 2 POSITIVE (A) NEGATIVE Final    Comment: RESULT CALLED TO, READ BACK BY AND VERIFIED WITH: CREWS,M @ 1819 ON 11/18/18 BY JUW Performed at [facility], [address] [place], [place] [address]   SARS Coronavirus 2 [facility] order, Performed in [facility] hospital lab) Nasopharyngeal Nasopharyngeal Swab     Status: Abnormal   Collection Time: 11/20/18  5:59 PM   Specimen: Nasopharyngeal Swab  Result Value Ref Range Status   SARS Coronavirus 2 POSITIVE (A) NEGATIVE Final  Comment: RESULT CALLED TO, READ BACK BY AND VERIFIED WITH: MOORE,E AT 2020 ON 9.15.20 BY ISLEY,B (NOTE) If result is NEGATIVE SARS-CoV-2 target nucleic acids are NOT DETECTED. The SARS-CoV-2 RNA is generally detectable in upper and lower  respiratory specimens during the acute phase of infection. The lowest  concentration of SARS-CoV-2 viral copies this assay can detect is 250  copies / mL. A negative result does not preclude SARS-CoV-2 infection  and should not be used as the sole basis for treatment or other  patient management decisions.  A negative result may occur with  improper specimen collection / handling, submission of specimen other  than nasopharyngeal swab, presence of viral mutation(s) within  the  areas targeted by this assay, and inadequate number of viral copies  (<250 copies / mL). A negative result must be combined with clinical  observations, patient history, and epidemiological information. If result is POSITIVE SARS-CoV-2 target nucleic acids are DETECTED.  The SARS-CoV-2 RNA is generally detectable in upper and lower  respiratory specimens during the acute phase of infection.  Positive  results are indicative of active infection with SARS-CoV-2.  Clinical  correlation with patient history and other diagnostic information is  necessary to determine patient infection status.  Positive results do  not rule out bacterial infection or co-infection with other viruses. If result is PRESUMPTIVE POSTIVE SARS-CoV-2 nucleic acids MAY BE PRESENT.   A presumptive positive result was obtained on the submitted specimen  and confirmed on repeat testing.  While 2019 novel coronavirus  (SARS-CoV-2) nucleic acids may be present in the submitted sample  additional confirmatory testing may be necessary for epidemiological  and / or clinical management purposes  to differentiate between  SARS-CoV-2 and other Sarbecovirus currently known to infect humans.  If clinically indicated additional testing with an alternate test  methodology [phone])  is advised. The SARS-CoV-2 RNA is generally  detectable in upper and lower respiratory specimens during the acute  phase of infection. The expected result is Negative. Fact Sheet for Patients:  StrictlyIdeas.no Fact Sheet for Healthcare Providers: BankingDealers.co.za This test is not yet approved or cleared by the [place] FDA and has been authorized for detection and/or diagnosis of SARS-CoV-2 by FDA under an Emergency Use Authorization (EUA).  This EUA will remain in effect (meaning this test can be used) for the duration of the COVID-19 declaration under Section 564(b)(1) of the Act, 21  U.S.C. section 360bbb-3(b)(1), unless the authorization is terminated or revoked sooner. Performed at [facility], [address] [place], [place] [address]      Labs: BNP (last 3 results) No results for input(s): BNP in the last 8760 hours. Basic Metabolic Panel: Recent Labs  Lab 11/18/18 1650 11/20/18 1244  NA 134* 135  K 4.4 4.1  CL 105 105  CO2 15* 18*  GLUCOSE 229* 245*  BUN 22 26*  CREATININE 1.43* 1.75*  CALCIUM 9.5 9.1   Liver Function Tests: Recent Labs  Lab 11/18/18 1650 11/20/18 1244  AST 55* 41  ALT 51* 43  ALKPHOS 121 108  BILITOT 0.9 0.9  PROT 7.5 7.0  ALBUMIN 3.2* 3.1*   No results for input(s): LIPASE, AMYLASE in the last 168 hours. No results for input(s): AMMONIA in the last 168 hours. CBC: Recent Labs  Lab 11/18/18 1650 11/20/18 1244  WBC 7.5 7.1  NEUTROABS 4.9 4.7  HGB 12.8* 12.1*  HCT 38.8* 37.7*  MCV 94.2 95.4  PLT 468* 398   Cardiac Enzymes: Recent Labs  Lab  11/20/18 1838  CKTOTAL 51  CKMB PENDING   BNP: Invalid input(s): POCBNP CBG: Recent Labs  Lab 11/20/18 2111 11/20/18 2334 11/21/18 0329 11/21/18 0801  GLUCAP 129* 193* 225* 153*   D-Dimer Recent Labs    11/20/18 1838  DDIMER 2.35*   Hgb A1c No results for input(s): HGBA1C in the last 72 hours. Lipid Profile No results for input(s): CHOL, HDL, LDLCALC, TRIG, CHOLHDL, LDLDIRECT in the last 72 hours. Thyroid function studies No results for input(s): TSH, T4TOTAL, T3FREE, THYROIDAB in the last 72 hours.  Invalid input(s): FREET3 Anemia work up No results for input(s): VITAMINB12, FOLATE, FERRITIN, TIBC, IRON, RETICCTPCT in the last 72 hours. Urinalysis No results found for: COLORURINE, APPEARANCEUR, LABSPEC, [place], GLUCOSEU, [place], [place], KETONESUR, [place], UROBILINOGEN, NITRITE, LEUKOCYTESUR Sepsis Labs Invalid input(s): PROCALCITONIN,  WBC,  [place] Recent Results (from the past 240 hour(s))  SARS Coronavirus 2 [facility]  order, Performed in [facility] hospital lab) Nasopharyngeal Nasopharyngeal Swab     Status: Abnormal   Collection Time: 11/18/18  4:35 PM   Specimen: Nasopharyngeal Swab  Result Value Ref Range Status   SARS Coronavirus 2 POSITIVE (A) NEGATIVE Final    Comment: RESULT CALLED TO, READ BACK BY AND VERIFIED WITH: CREWS,M @ 1819 ON 11/18/18 BY JUW Performed at [facility], [address], [place], [place] [address]   SARS Coronavirus 2 [facility] order, Performed in [facility] hospital lab) Nasopharyngeal Nasopharyngeal Swab     Status: Abnormal   Collection Time: 11/20/18  5:59 PM   Specimen: Nasopharyngeal Swab  Result Value Ref Range Status   SARS Coronavirus 2 POSITIVE (A) NEGATIVE Final    Comment: RESULT CALLED TO, READ BACK BY AND VERIFIED WITH: MOORE,E AT 2020 ON 9.15.20 BY ISLEY,B (NOTE) If result is NEGATIVE SARS-CoV-2 target nucleic acids are NOT DETECTED. The SARS-CoV-2 RNA is generally detectable in upper and lower  respiratory specimens during the acute phase of infection. The lowest  concentration of SARS-CoV-2 viral copies this assay can detect is 250  copies / mL. A negative result does not preclude SARS-CoV-2 infection  and should not be used as the sole basis for treatment or other  patient management decisions.  A negative result may occur with  improper specimen collection / handling, submission of specimen other  than nasopharyngeal swab, presence of viral mutation(s) within the  areas targeted by this assay, and inadequate number of viral copies  (<250 copies / mL). A negative result must be combined with clinical  observations, patient history, and epidemiological information. If result is POSITIVE SARS-CoV-2 target nucleic acids are DETECTED.  The SARS-CoV-2 RNA is generally detectable in upper and lower  respiratory specimens during the acute phase of infection.  Positive  results are indicative of active infection with SARS-CoV-2.  Clinical  correlation with  patient history and other diagnostic information is  necessary to determine patient infection status.  Positive results do  not rule out bacterial infection or co-infection with other viruses. If result is PRESUMPTIVE POSTIVE SARS-CoV-2 nucleic acids MAY BE PRESENT.   A presumptive positive result was obtained on the submitted specimen  and confirmed on repeat testing.  While 2019 novel coronavirus  (SARS-CoV-2) nucleic acids may be present in the submitted sample  additional confirmatory testing may be necessary for epidemiological  and / or clinical management purposes  to differentiate between  SARS-CoV-2 and other Sarbecovirus currently known to infect humans.  If clinically indicated additional testing with an alternate test  methodology [phone])  is  advised. The SARS-CoV-2 RNA is generally  detectable in upper and lower respiratory specimens during the acute  phase of infection. The expected result is Negative. Fact Sheet for Patients:  StrictlyIdeas.no Fact Sheet for Healthcare Providers: BankingDealers.co.za This test is not yet approved or cleared by the [place] FDA and has been authorized for detection and/or diagnosis of SARS-CoV-2 by FDA under an Emergency Use Authorization (EUA).  This EUA will remain in effect (meaning this test can be used) for the duration of the COVID-19 declaration under Section 564(b)(1) of the Act, 21 U.S.C. section 360bbb-3(b)(1), unless the authorization is terminated or revoked sooner. Performed at [facility], [address] [place], [place] [address]      Time coordinating discharge: 45 minutes  SIGNED:   Tawni Millers, MD  [facility] 11/21/2018, 9:57 AM

## 2018-11-23 LAB — INTERLEUKIN-6, PLASMA: Interleukin-6, Plasma: 5.2 pg/mL (ref 0.0–12.2)

## 2018-12-28 ENCOUNTER — Emergency Department (HOSPITAL_COMMUNITY): Payer: Medicare Other

## 2018-12-28 ENCOUNTER — Other Ambulatory Visit: Payer: Self-pay

## 2018-12-28 ENCOUNTER — Inpatient Hospital Stay (HOSPITAL_COMMUNITY)
Admission: EM | Admit: 2018-12-28 | Discharge: 2018-12-31 | DRG: 291 | Disposition: A | Discharge status: Home / Self Care | Payer: Medicare Other | Attending: Internal Medicine | Admitting: Internal Medicine

## 2018-12-28 ENCOUNTER — Encounter (HOSPITAL_COMMUNITY): Payer: Self-pay | Admitting: Emergency Medicine

## 2018-12-28 DIAGNOSIS — Z8249 Family history of ischemic heart disease and other diseases of the circulatory system: Secondary | ICD-10-CM

## 2018-12-28 DIAGNOSIS — I2510 Atherosclerotic heart disease of native coronary artery without angina pectoris: Secondary | ICD-10-CM | POA: Diagnosis present

## 2018-12-28 DIAGNOSIS — R0600 Dyspnea, unspecified: Secondary | ICD-10-CM | POA: Diagnosis present

## 2018-12-28 DIAGNOSIS — I509 Heart failure, unspecified: Secondary | ICD-10-CM | POA: Diagnosis not present

## 2018-12-28 DIAGNOSIS — E1165 Type 2 diabetes mellitus with hyperglycemia: Secondary | ICD-10-CM | POA: Diagnosis present

## 2018-12-28 DIAGNOSIS — Z823 Family history of stroke: Secondary | ICD-10-CM

## 2018-12-28 DIAGNOSIS — U071 COVID-19: Secondary | ICD-10-CM | POA: Diagnosis not present

## 2018-12-28 DIAGNOSIS — M109 Gout, unspecified: Secondary | ICD-10-CM | POA: Diagnosis present

## 2018-12-28 DIAGNOSIS — Z885 Allergy status to narcotic agent status: Secondary | ICD-10-CM

## 2018-12-28 DIAGNOSIS — E1122 Type 2 diabetes mellitus with diabetic chronic kidney disease: Secondary | ICD-10-CM | POA: Diagnosis not present

## 2018-12-28 DIAGNOSIS — I252 Old myocardial infarction: Secondary | ICD-10-CM

## 2018-12-28 DIAGNOSIS — E119 Type 2 diabetes mellitus without complications: Secondary | ICD-10-CM

## 2018-12-28 DIAGNOSIS — D631 Anemia in chronic kidney disease: Secondary | ICD-10-CM | POA: Diagnosis present

## 2018-12-28 DIAGNOSIS — E876 Hypokalemia: Secondary | ICD-10-CM | POA: Diagnosis not present

## 2018-12-28 DIAGNOSIS — I471 Supraventricular tachycardia: Secondary | ICD-10-CM | POA: Diagnosis present

## 2018-12-28 DIAGNOSIS — Z7984 Long term (current) use of oral hypoglycemic drugs: Secondary | ICD-10-CM

## 2018-12-28 DIAGNOSIS — I130 Hypertensive heart and chronic kidney disease with heart failure and stage 1 through stage 4 chronic kidney disease, or unspecified chronic kidney disease: Secondary | ICD-10-CM | POA: Diagnosis not present

## 2018-12-28 DIAGNOSIS — R0602 Shortness of breath: Secondary | ICD-10-CM

## 2018-12-28 DIAGNOSIS — Z79899 Other long term (current) drug therapy: Secondary | ICD-10-CM

## 2018-12-28 DIAGNOSIS — M5030 Other cervical disc degeneration, unspecified cervical region: Secondary | ICD-10-CM | POA: Diagnosis present

## 2018-12-28 DIAGNOSIS — B948 Sequelae of other specified infectious and parasitic diseases: Secondary | ICD-10-CM

## 2018-12-28 DIAGNOSIS — K5900 Constipation, unspecified: Secondary | ICD-10-CM | POA: Diagnosis not present

## 2018-12-28 DIAGNOSIS — N400 Enlarged prostate without lower urinary tract symptoms: Secondary | ICD-10-CM | POA: Diagnosis present

## 2018-12-28 DIAGNOSIS — I5041 Acute combined systolic (congestive) and diastolic (congestive) heart failure: Secondary | ICD-10-CM

## 2018-12-28 DIAGNOSIS — Z7982 Long term (current) use of aspirin: Secondary | ICD-10-CM

## 2018-12-28 DIAGNOSIS — I5043 Acute on chronic combined systolic (congestive) and diastolic (congestive) heart failure: Secondary | ICD-10-CM | POA: Diagnosis present

## 2018-12-28 DIAGNOSIS — N183 Chronic kidney disease, stage 3 unspecified: Secondary | ICD-10-CM | POA: Diagnosis present

## 2018-12-28 DIAGNOSIS — E785 Hyperlipidemia, unspecified: Secondary | ICD-10-CM | POA: Diagnosis present

## 2018-12-28 LAB — BASIC METABOLIC PANEL
Anion gap: 8 (ref 5–15)
BUN: 17 mg/dL (ref 8–23)
CO2: 20 mmol/L — ABNORMAL LOW (ref 22–32)
Calcium: 8.5 mg/dL — ABNORMAL LOW (ref 8.9–10.3)
Chloride: 112 mmol/L — ABNORMAL HIGH (ref 98–111)
Creatinine, Ser: 1.32 mg/dL — ABNORMAL HIGH (ref 0.61–1.24)
GFR calc Af Amer: 58 mL/min — ABNORMAL LOW (ref >60)
GFR calc non Af Amer: 50 mL/min — ABNORMAL LOW (ref >60)
Glucose, Bld: 130 mg/dL — ABNORMAL HIGH (ref 70–99)
Potassium: 4 mmol/L (ref 3.5–5.1)
Sodium: 140 mmol/L (ref 135–145)

## 2018-12-28 LAB — RETICULOCYTES
Immature Retic Fract: 27.6 % — ABNORMAL HIGH (ref 2.3–15.9)
RBC.: 2.89 MIL/uL — ABNORMAL LOW (ref 4.22–5.81)
Retic Count, Absolute: 72 10*3/uL (ref 19.0–186.0)
Retic Ct Pct: 2.5 % (ref 0.4–3.1)

## 2018-12-28 LAB — CBC
HCT: 29.9 % — ABNORMAL LOW (ref 39.0–52.0)
Hemoglobin: 9.1 g/dL — ABNORMAL LOW (ref 13.0–17.0)
MCH: 31.2 pg (ref 26.0–34.0)
MCHC: 30.4 g/dL (ref 30.0–36.0)
MCV: 102.4 fL — ABNORMAL HIGH (ref 80.0–100.0)
Platelets: 203 10*3/uL (ref 150–400)
RBC: 2.92 MIL/uL — ABNORMAL LOW (ref 4.22–5.81)
RDW: 16.1 % — ABNORMAL HIGH (ref 11.5–15.5)
WBC: 5.9 10*3/uL (ref 4.0–10.5)
nRBC: 0 % (ref 0.0–0.2)

## 2018-12-28 LAB — HEPATIC FUNCTION PANEL
ALT: 21 U/L (ref 0–44)
AST: 22 U/L (ref 15–41)
Albumin: 3.2 g/dL — ABNORMAL LOW (ref 3.5–5.0)
Alkaline Phosphatase: 73 U/L (ref 38–126)
Bilirubin, Direct: 0.1 mg/dL (ref 0.0–0.2)
Indirect Bilirubin: 0.5 mg/dL (ref 0.3–0.9)
Total Bilirubin: 0.6 mg/dL (ref 0.3–1.2)
Total Protein: 6.3 g/dL — ABNORMAL LOW (ref 6.5–8.1)

## 2018-12-28 LAB — IRON AND TIBC
Iron: 34 ug/dL — ABNORMAL LOW (ref 45–182)
Saturation Ratios: 13 % — ABNORMAL LOW (ref 17.9–39.5)
TIBC: 270 ug/dL (ref 250–450)
UIBC: 236 ug/dL

## 2018-12-28 LAB — FOLATE: Folate: 9.1 ng/mL (ref >5.9)

## 2018-12-28 LAB — FERRITIN: Ferritin: 90 ng/mL (ref 24–336)

## 2018-12-28 LAB — D-DIMER, QUANTITATIVE: D-Dimer, Quant: 1.71 ug/mL-FEU — ABNORMAL HIGH (ref 0.00–0.50)

## 2018-12-28 LAB — TROPONIN I (HIGH SENSITIVITY)
Troponin I (High Sensitivity): 199 ng/L (ref <18)
Troponin I (High Sensitivity): 195 ng/L (ref <18)

## 2018-12-28 LAB — BRAIN NATRIURETIC PEPTIDE: B Natriuretic Peptide: 477 pg/mL — ABNORMAL HIGH (ref 0.0–100.0)

## 2018-12-28 LAB — PROTIME-INR
INR: 1.1 (ref 0.8–1.2)
Prothrombin Time: 14.3 seconds (ref 11.4–15.2)

## 2018-12-28 LAB — VITAMIN B12: Vitamin B-12: 119 pg/mL — ABNORMAL LOW (ref 180–914)

## 2018-12-28 MED ORDER — IOHEXOL 350 MG/ML SOLN: 75.0000 mL | Freq: Once | INTRAVENOUS | Status: DC | PRN

## 2018-12-28 MED ORDER — SODIUM CHLORIDE 0.9% FLUSH: 3.0000 mL | Freq: Once | INTRAVENOUS | Status: DC

## 2018-12-28 MED ORDER — FUROSEMIDE 40 MG PO TABS
20.0000 mg | ORAL_TABLET | Freq: Once | ORAL | Status: AC
  Administered 2018-12-28: 20 mg via ORAL
  Filled 2018-12-28: qty 1

## 2018-12-28 MED ORDER — IOHEXOL 350 MG/ML SOLN
100.0000 mL | Freq: Once | INTRAVENOUS | Status: AC | PRN
  Administered 2018-12-28: 100 mL via INTRAVENOUS

## 2018-12-28 NOTE — Discharge Instructions (Addendum)
Start taking your fluid pill once a day.  And follow-up with your doctor next week

## 2018-12-28 NOTE — ED Provider Notes (Signed)
[facility] EMERGENCY DEPARTMENT Provider Note   CSN: [id number] Arrival date & time: 12/28/18  1324     History   Chief Complaint Chief Complaint  Patient presents with  . Shortness of Breath    HPI Michael Cobb is a 84 y.o. male.     Patient complains of some shortness of breath.  He describes it as painful when he takes a deep breath.  Patient states that he has been seen twice before the hospital here for the same thing he was admitted over at [facility] and evaluated and sent home.  The history is provided by the patient. No language interpreter was used.  Shortness of Breath Severity:  Mild Onset quality:  Gradual Timing:  Intermittent Progression:  Waxing and waning Chronicity:  Recurrent Context: activity   Associated symptoms: no abdominal pain, no chest pain, no cough, no headaches and no rash     Past Medical History:  Diagnosis Date  . BPH (benign prostatic hyperplasia)   . Cancer (HCC)    Skin  . Chronic renal insufficiency, stage 3 (moderate)   . Hyperlipidemia   . Hypertension   . Non-ST elevated myocardial infarction (non-STEMI) ([place])   . Syncope   . Type 2 diabetes mellitus [facility])     Patient Active Problem List   Diagnosis Date Noted  . NSTEMI (non-ST elevated myocardial infarction) ([place]) 11/20/2018  . Chest pain 11/20/2018  . COVID-19 virus infection 11/20/2018  . Sepsis ([place]) 09/09/2017  . Generalized headaches 09/08/2017  . DDD (degenerative disc disease), cervical 09/08/2017  . DM2 (diabetes mellitus, type 2) ([place]) 09/08/2017  . Benign essential HTN 09/08/2017  . CAD (coronary artery disease) 07/11/2017  . History of non-ST elevation myocardial infarction (NSTEMI) 07/11/2017  . Leg pain, bilateral 07/11/2017  . Abnormal stress test     Past Surgical History:  Procedure Laterality Date  . APPENDECTOMY    . BACK SURGERY    . LEFT HEART CATH AND CORONARY ANGIOGRAPHY N/A 06/27/2017   Procedure: LEFT HEART CATH AND  CORONARY ANGIOGRAPHY;  Surgeon: Burnell Blanks, MD;  Location: [place] [facility];  Service: Cardiovascular;  Laterality: N/A;  . SURGERY OF LIP          Home Medications    Prior to Admission medications   Medication Sig Start Date End Date Taking? Authorizing Provider  allopurinol (ZYLOPRIM) 300 MG tablet Take 300 mg by mouth daily.   Yes [provider]  amLODipine (NORVASC) 10 MG tablet Take 1 tablet (10 mg total) by mouth daily. 11/22/18 12/28/18 Yes Arrien, Jimmy Picket, MD  aspirin EC 81 MG tablet Take 81 mg by mouth at bedtime.    Yes [provider]  fluticasone (FLONASE) 50 MCG/ACT nasal spray Place 1 spray into both nostrils daily as needed for allergies or rhinitis.    Yes [provider]  furosemide (LASIX) 20 MG tablet Take 20 mg by mouth daily as needed for fluid.    Yes [provider]  glipiZIDE (GLUCOTROL) 10 MG tablet Take 10 mg by mouth 2 (two) times daily before a meal.  02/23/17  Yes [provider]  HYDROcodone-acetaminophen (NORCO/VICODIN) 5-325 MG tablet Take 1 tablet by mouth daily as needed for moderate pain.   Yes [provider]  metFORMIN (GLUCOPHAGE) 500 MG tablet Take 500 mg by mouth 2 (two) times daily with a meal.   Yes [provider]  Omega-3 Fatty Acids (FISH OIL PO) Take 1 tablet by mouth  2 (two) times daily.    Yes [provider]  ramipril (ALTACE) 10 MG capsule Take 10 mg by mouth 2 (two) times daily. 04/07/17  Yes [provider]  tamsulosin (FLOMAX) 0.4 MG CAPS capsule Take 0.4 mg by mouth at bedtime.    Yes [provider]  acetaminophen (TYLENOL) 500 MG tablet Take 500 mg by mouth every 6 (six) hours as needed for mild pain or moderate pain.    [provider]  cetirizine (ZYRTEC) 10 MG tablet Take 10 mg by mouth at bedtime.     [provider]  dextromethorphan-guaiFENesin (MUCINEX DM) 30-600 MG 12hr tablet Take 1 tablet by mouth 2  (two) times daily.    [provider]  docusate sodium (COLACE) 100 MG capsule Take 100 mg by mouth 2 (two) times daily.    [provider]  isosorbide mononitrate (IMDUR) 30 MG 24 hr tablet Take 1 tablet (30 mg total) by mouth daily. 11/21/18 12/21/18  Arrien, Jimmy Picket, MD    Family History Family History  Problem Relation Age of Onset  . Hypertension Father   . CVA Father     Social History Social History   Tobacco Use  . Smoking status: Never Smoker  . Smokeless tobacco: Never Used  Substance Use Topics  . Alcohol use: Never    Frequency: Never  . Drug use: Never     Allergies   Morphine and related   Review of Systems Review of Systems  Constitutional: Negative for appetite change and fatigue.  HENT: Negative for congestion, ear discharge and sinus pressure.   Eyes: Negative for discharge.  Respiratory: Positive for shortness of breath. Negative for cough.   Cardiovascular: Negative for chest pain.  Gastrointestinal: Negative for abdominal pain and diarrhea.  Genitourinary: Negative for frequency and hematuria.  Musculoskeletal: Negative for back pain.  Skin: Negative for rash.  Neurological: Negative for seizures and headaches.  Psychiatric/Behavioral: Negative for hallucinations.     Physical Exam Updated Vital Signs BP 140/83   Pulse 66   Temp 98.4 F (36.9 C)   Resp 17   Ht 5\' 10"  (1.778 m)   Wt 101.6 kg   SpO2 95%   BMI 32.14 kg/m   Physical Exam Vitals signs and nursing note reviewed.  Constitutional:      Appearance: He is well-developed.  HENT:     Head: Normocephalic.  Eyes:     General: No scleral icterus.    Conjunctiva/sclera: Conjunctivae normal.  Neck:     Musculoskeletal: Neck supple.     Thyroid: No thyromegaly.  Cardiovascular:     Rate and Rhythm: Normal rate and regular rhythm.     Heart sounds: No murmur. No friction rub. No gallop.   Pulmonary:     Breath sounds: No stridor. No wheezing or  rales.  Chest:     Chest wall: No tenderness.  Abdominal:     General: There is no distension.     Tenderness: There is no abdominal tenderness. There is no rebound.  Musculoskeletal: Normal range of motion.  Lymphadenopathy:     Cervical: No cervical adenopathy.  Skin:    Findings: No erythema or rash.  Neurological:     Mental Status: He is oriented to person, place, and time.     Motor: No abnormal muscle tone.     Coordination: Coordination normal.  Psychiatric:        Behavior: Behavior normal.      ED Treatments / Results  Labs (all labs ordered are listed, but only abnormal results are displayed) Labs Reviewed  BASIC METABOLIC PANEL - Abnormal; Notable for the following components:      Result Value   Chloride 112 (*)    CO2 20 (*)    Glucose, Bld 130 (*)    Creatinine, Ser 1.32 (*)    Calcium 8.5 (*)    GFR calc non Af Amer 50 (*)    GFR calc Af Amer 58 (*)    All other components within normal limits  CBC - Abnormal; Notable for the following components:   RBC 2.92 (*)    Hemoglobin 9.1 (*)    HCT 29.9 (*)    MCV 102.4 (*)    RDW 16.1 (*)    All other components within normal limits  HEPATIC FUNCTION PANEL - Abnormal; Notable for the following components:   Total Protein 6.3 (*)    Albumin 3.2 (*)    All other components within normal limits  D-DIMER, QUANTITATIVE (NOT AT [facility]) - Abnormal; Notable for the following components:   D-Dimer, Quant 1.71 (*)    All other components within normal limits  BRAIN NATRIURETIC PEPTIDE - Abnormal; Notable for the following components:   B Natriuretic Peptide 477.0 (*)    All other components within normal limits  TROPONIN I (HIGH SENSITIVITY) - Abnormal; Notable for the following components:   Troponin I (High Sensitivity) 199 (*)    All other components within normal limits  TROPONIN I (HIGH SENSITIVITY) - Abnormal; Notable for the following components:   Troponin I (High Sensitivity) 195 (*)    All other  components within normal limits  SARS CORONAVIRUS 2 (TAT 6-24 HRS)  PROTIME-INR    EKG EKG Interpretation  Date/Time:  Friday December 28 2018 16:14:26 EDT Ventricular Rate:  70 PR Interval:    QRS Duration: 109 QT Interval:  408 QTC Calculation: 441 R Axis:   32 Text Interpretation:  Sinus rhythm Multiple premature complexes, vent & supraven Prolonged PR interval Inferior infarct, age indeterminate Consider anterolateral infarct Confirmed by Milton Ferguson [phone]) on 12/28/2018 10:14:49 PM   Radiology Ct Angio Chest Pe W And/or Wo Contrast  Result Date: 12/28/2018 CLINICAL DATA:  Shortness of breath EXAM: CT ANGIOGRAPHY CHEST WITH CONTRAST TECHNIQUE: Multidetector CT imaging of the chest was performed using the standard protocol during bolus administration of intravenous contrast. Multiplanar CT image reconstructions and MIPs were obtained to evaluate the vascular anatomy. CONTRAST:  100 mL Isovue 370 intravenous COMPARISON:  CT 11/18/2018, chest x-ray 12/28/2018 FINDINGS: Cardiovascular: Satisfactory opacification of the pulmonary arteries to the segmental level. No evidence of pulmonary embolism. Nonaneurysmal aorta. Moderate aortic atherosclerosis. Coronary vascular calcification. Mild cardiomegaly. No pericardial effusion. Mediastinum/Nodes: Midline trachea. No thyroid mass. Subcentimeter mediastinal lymph nodes. Mild circumferential wall thickening of the distal esophagus. Lungs/Pleura: New small bilateral pleural effusions. Diffuse bilateral septal thickening. Partial resolution of previously noted primarily peripheral ground-glass densities. No new ground-glass abnormality. Partial atelectasis at both lower lobes. Upper Abdomen: No acute abnormality. Musculoskeletal: No chest wall abnormality. No acute or significant osseous findings. Review of the MIP images confirms the above findings. IMPRESSION: 1. Negative for acute pulmonary embolus. 2. Cardiomegaly with interim development of  small bilateral pleural effusions. Diffuse bilateral septal thickening suggesting mild pulmonary edema. 3. Improved appearance of previously noted peripheral ground-glass densities with mild residual density in the upper lobes. Partial atelectasis at the lower lobes. Aortic Atherosclerosis (ICD10-I70.0). Electronically Signed   By: Madie Reno.D.  On: 12/28/2018 21:29   Dg Chest Port 1 View  Result Date: 12/28/2018 CLINICAL DATA:  Acute shortness of breath EXAM: PORTABLE CHEST 1 VIEW COMPARISON:  11/20/2018 and prior radiographs FINDINGS: Cardiomegaly and mild pulmonary vascular congestion noted. Elevated RIGHT hemidiaphragm again identified. There is no evidence of focal airspace disease, pulmonary edema, suspicious pulmonary nodule/mass, pleural effusion, or pneumothorax. No acute bony abnormalities are identified. IMPRESSION: 1. Cardiomegaly with mild pulmonary vascular congestion. Electronically Signed   By: Margarette Canada M.D.   On: 12/28/2018 17:00    Procedures Procedures (including critical care time)  Medications Ordered in ED Medications  sodium chloride flush (NS) 0.9 % injection 3 mL (has no administration in time range)  iohexol (OMNIPAQUE) 350 MG/ML injection 75 mL ( Intravenous Canceled Entry 12/28/18 2059)  furosemide (LASIX) tablet 20 mg (has no administration in time range)  iohexol (OMNIPAQUE) 350 MG/ML injection 100 mL (100 mLs Intravenous Contrast Given 12/28/18 2100)     Initial Impression / Assessment and Plan / ED Course  I have reviewed the triage vital signs and the nursing notes.  Pertinent labs & imaging results that were available during my care of the patient were reviewed by me and considered in my medical decision making (see chart for details).    Patient with coronary artery disease and shortness of breath with mild congestive heart failure and worsening anemia.  Shortness of breath could be related to coronary artery disease.  He will be admitted to  medicine      Final Clinical Impressions(s) / ED Diagnoses   Final diagnoses:  Shortness of breath    ED Discharge Orders    None       Milton Ferguson, MD 12/28/18 2304

## 2018-12-28 NOTE — ED Notes (Signed)
Date and time results received: 12/28/18 1732 (use smartphrase ".now" to insert current time)  Test: Tropinin Critical Value: 199  Name of Provider Notified: Dr. Roderic Palau  Orders Received? Or Actions Taken?: n/a

## 2018-12-28 NOTE — ED Notes (Signed)
Pt resting comfortably at this time.

## 2018-12-28 NOTE — H&P (Signed)
TRH H&P    Patient Demographics:    Michael Cobb, is a 84 y.o. male  MRN: [id number]  DOB - 1935/01/11  Admit Date - 12/28/2018  Referring MD/NP/PA:   [place]  Outpatient Primary MD for the patient is Sandi Mealy, MD [facility] - cardiology [name] - cardiology  Patient coming from: home  Chief complaint- dyspnea   HPI:    Michael Cobb  is a 84 y.o. male, w hypertension, hyperlipidemia, Dm2, CKD stage3, CADs/p NSTEMI 2019 , hx of covid -19 positive (11/18/2018), apparently presents with c/o dyspnea worse for the past 2 weeks. ? Slight orthopnea, and lower extremity edema.  Pt states he has lost weight.  Pt denies fever, chills, cough, cp, palp, n/v, diarrhea, brbpr.   In ED,  T 98.4, P 88, R 18, Bp 136/70 pox 96% on RA Wt 101.6 kg  Na 140, K 4.0, Bun 17, Creatinine 1.32 Wbc 5.9, Hgb 9.1, Plt 203 Alb 3.2 Ast 22 D dimer 1.71 BNP 477 B12 119 Ferritin 90  Trop 199 (prev 518)-> 195  EKG: nsr at 90, nl axis, early R progression , q in 2,3, avf, v4-6   CTA chest IMPRESSION: 1. Negative for acute pulmonary embolus. 2. Cardiomegaly with interim development of small bilateral pleural effusions. Diffuse bilateral septal thickening suggesting mild pulmonary edema. 3. Improved appearance of previously noted peripheral ground-glass densities with mild residual density in the upper lobes. Partial atelectasis at the lower lobes.  Pt given lasix 20mg  iv x 1 in ED    Pt will be admitted for c/o dyspnea likely related to very mild acute on chronic chf , but more likely sequela of prior covid-19 infection.    Review of systems:    In addition to the HPI above,  No Fever-chills, No Headache, No changes with Vision or hearing, No problems swallowing food or Liquids, No Chest pain,  No Abdominal pain, No Nausea or Vomiting, bowel movements are regular, No Blood in stool or  Urine, No dysuria, No new skin rashes or bruises, No new joints pains-aches,  No new weakness, tingling, numbness in any extremity, No recent weight gain or loss, No polyuria, polydypsia or polyphagia, No significant Mental Stressors.  All other systems reviewed and are negative.    Past History of the following :    Past Medical History:  Diagnosis Date  . BPH (benign prostatic hyperplasia)   . Cancer (HCC)    Skin  . Chronic renal insufficiency, stage 3 (moderate)   . Hyperlipidemia   . Hypertension   . Non-ST elevated myocardial infarction (non-STEMI) ([place])   . Syncope   . Type 2 diabetes mellitus ([place])       Past Surgical History:  Procedure Laterality Date  . APPENDECTOMY    . BACK SURGERY    . LEFT HEART CATH AND CORONARY ANGIOGRAPHY N/A 06/27/2017   Procedure: LEFT HEART CATH AND CORONARY ANGIOGRAPHY;  Surgeon: Burnell Blanks, MD;  Location: [facility];  Service: Cardiovascular;  Laterality: N/A;  . SURGERY OF LIP  Social History:      Social History   Tobacco Use  . Smoking status: Never Smoker  . Smokeless tobacco: Never Used  Substance Use Topics  . Alcohol use: Never    Frequency: Never       Family History :     Family History  Problem Relation Age of Onset  . Hypertension Father   . CVA Father        Home Medications:   Prior to Admission medications   Medication Sig Start Date End Date Taking? Authorizing Provider  allopurinol (ZYLOPRIM) 300 MG tablet Take 300 mg by mouth daily.   Yes [provider]  amLODipine (NORVASC) 10 MG tablet Take 1 tablet (10 mg total) by mouth daily. 11/22/18 12/28/18 Yes Arrien, Jimmy Picket, MD  aspirin EC 81 MG tablet Take 81 mg by mouth at bedtime.    Yes [provider]  fluticasone (FLONASE) 50 MCG/ACT nasal spray Place 1 spray into both nostrils daily as needed for allergies or rhinitis.    Yes [provider]  furosemide (LASIX) 20 MG tablet Take  20 mg by mouth daily as needed for fluid.    Yes [provider]  glipiZIDE (GLUCOTROL) 10 MG tablet Take 10 mg by mouth 2 (two) times daily before a meal.  02/23/17  Yes [provider]  HYDROcodone-acetaminophen (NORCO/VICODIN) 5-325 MG tablet Take 1 tablet by mouth daily as needed for moderate pain.   Yes [provider]  metFORMIN (GLUCOPHAGE) 500 MG tablet Take 500 mg by mouth 2 (two) times daily with a meal.   Yes [provider]  Omega-3 Fatty Acids (FISH OIL PO) Take 1 tablet by mouth 2 (two) times daily.    Yes [provider]  ramipril (ALTACE) 10 MG capsule Take 10 mg by mouth 2 (two) times daily. 04/07/17  Yes [provider]  tamsulosin (FLOMAX) 0.4 MG CAPS capsule Take 0.4 mg by mouth at bedtime.    Yes [provider]  acetaminophen (TYLENOL) 500 MG tablet Take 500 mg by mouth every 6 (six) hours as needed for mild pain or moderate pain.    [provider]  cetirizine (ZYRTEC) 10 MG tablet Take 10 mg by mouth at bedtime.     [provider]  dextromethorphan-guaiFENesin (MUCINEX DM) 30-600 MG 12hr tablet Take 1 tablet by mouth 2 (two) times daily.    [provider]  docusate sodium (COLACE) 100 MG capsule Take 100 mg by mouth 2 (two) times daily.    [provider]  isosorbide mononitrate (IMDUR) 30 MG 24 hr tablet Take 1 tablet (30 mg total) by mouth daily. 11/21/18 12/21/18  Arrien, Jimmy Picket, MD     Allergies:     Allergies  Allergen Reactions  . Morphine And Related Other (See Comments)    Hallucinate     Physical Exam:   Vitals  Blood pressure 131/65, pulse 65, temperature 98.4 F (36.9 C), resp. rate 13, height 5\' 10"  (1.778 m), weight 101.6 kg, SpO2 91 %.  1.  General: axoxo3  2. Psychiatric: euthymic  3. Neurologic: nonfocal  4. HEENMT:  Anicteric, pupils 1.60mm symmetric, direct, consensual, near intact Neck: trace jvd  5. Respiratory : Slight  crackles bilateral base, no wheeze  6. Cardiovascular : rrr s1, s2,  No m/g/r  7. Gastrointestinal:  Abd: soft, nt, nd, +bs  8. Skin:  Ext: no c/c  Trace edema,  No rash  9.Musculoskeletal:  Good ROM  Data Review:    CBC Recent Labs  Lab 12/28/18 1611  WBC 5.9  HGB 9.1*  HCT 29.9*  PLT 203  MCV 102.4*  MCH 31.2  MCHC 30.4  RDW 16.1*   ------------------------------------------------------------------------------------------------------------------  Results for orders placed or performed during the hospital encounter of 12/28/18 (from the past 48 hour(s))  Basic metabolic panel     Status: Abnormal   Collection Time: 12/28/18  4:11 PM  Result Value Ref Range   Sodium 140 135 - 145 mmol/L   Potassium 4.0 3.5 - 5.1 mmol/L   Chloride 112 (H) 98 - 111 mmol/L   CO2 20 (L) 22 - 32 mmol/L   Glucose, Bld 130 (H) 70 - 99 mg/dL   BUN 17 8 - 23 mg/dL   Creatinine, Ser 1.32 (H) 0.61 - 1.24 mg/dL   Calcium 8.5 (L) 8.9 - 10.3 mg/dL   GFR calc non Af Amer 50 (L) >60 mL/min   GFR calc Af Amer 58 (L) >60 mL/min   Anion gap 8 5 - 15    Comment: Performed at [facility], [address], [place], [place] [address]  CBC     Status: Abnormal   Collection Time: 12/28/18  4:11 PM  Result Value Ref Range   WBC 5.9 4.0 - 10.5 K/uL   RBC 2.92 (L) 4.22 - 5.81 MIL/uL   Hemoglobin 9.1 (L) 13.0 - 17.0 g/dL   HCT 29.9 (L) 39.0 - 52.0 %   MCV 102.4 (H) 80.0 - 100.0 fL   MCH 31.2 26.0 - 34.0 pg   MCHC 30.4 30.0 - 36.0 g/dL   RDW 16.1 (H) 11.5 - 15.5 %   Platelets 203 150 - 400 K/uL   nRBC 0.0 0.0 - 0.2 %    Comment: Performed at [facility], [address] [place], [place] [address]  Troponin I (High Sensitivity)     Status: Abnormal   Collection Time: 12/28/18  4:11 PM  Result Value Ref Range   Troponin I (High Sensitivity) 199 (HH) <18 ng/L    Comment: CRITICAL RESULT CALLED TO, READ BACK BY AND VERIFIED WITH: MOORE,M ON 12/28/18 AT 1730 BY LOY,C (NOTE) Elevated high  sensitivity troponin I (hsTnI) values and significant  changes across serial measurements may suggest ACS but many other  chronic and acute conditions are known to elevate hsTnI results.  Refer to the Links section for chest pain algorithms and additional  guidance. Performed at [facility], [address], [place], [place] [address]   Protime-INR (order if Patient is taking Coumadin / Warfarin)     Status: None   Collection Time: 12/28/18  4:11 PM  Result Value Ref Range   Prothrombin Time 14.3 11.4 - 15.2 seconds   INR 1.1 0.8 - 1.2    Comment: (NOTE) INR goal varies based on device and disease states. Performed at [facility], [address] [place], [place] [address]   Hepatic function panel     Status: Abnormal   Collection Time: 12/28/18  4:11 PM  Result Value Ref Range   Total Protein 6.3 (L) 6.5 - 8.1 g/dL   Albumin 3.2 (L) 3.5 - 5.0 g/dL   AST 22 15 - 41 U/L   ALT 21 0 - 44 U/L   Alkaline Phosphatase 73 38 - 126 U/L   Total Bilirubin 0.6 0.3 - 1.2 mg/dL   Bilirubin, Direct 0.1 0.0 - 0.2 mg/dL   Indirect Bilirubin 0.5 0.3 - 0.9 mg/dL    Comment: Performed  at [facility], [address], [place], [place] [address]  D-dimer, quantitative (not at [facility])     Status: Abnormal   Collection Time: 12/28/18  4:11 PM  Result Value Ref Range   D-Dimer, Quant 1.71 (H) 0.00 - 0.50 ug/mL-FEU    Comment: (NOTE) At the manufacturer cut-off of 0.50 ug/mL FEU, this assay has been documented to exclude PE with a sensitivity and negative predictive value of 97 to 99%.  At this time, this assay has not been approved by the FDA to exclude DVT/VTE. Results should be correlated with clinical presentation. Performed at [facility], [address] [place], [place] [address]   Brain natriuretic peptide     Status: Abnormal   Collection Time: 12/28/18  4:11 PM  Result Value Ref Range   B Natriuretic Peptide 477.0 (H) 0.0 - 100.0 pg/mL    Comment: Performed at [facility], [address] [place], [place] [address]  Vitamin B12     Status: Abnormal   Collection Time: 12/28/18  4:11 PM  Result Value Ref Range   Vitamin B-12 119 (L) 180 - 914 pg/mL    Comment: (NOTE) This assay is not validated for testing neonatal or myeloproliferative syndrome specimens for Vitamin B12 levels. Performed at [facility], [address], [place], [place] [address]   Folate     Status: None   Collection Time: 12/28/18  4:11 PM  Result Value Ref Range   Folate 9.1 >5.9 ng/mL    Comment: Performed at [facility], [address] [place], [place] [address]  Iron and TIBC     Status: Abnormal   Collection Time: 12/28/18  4:11 PM  Result Value Ref Range   Iron 34 (L) 45 - 182 ug/dL   TIBC 270 250 - 450 ug/dL   Saturation Ratios 13 (L) 17.9 - 39.5 %   UIBC 236 ug/dL    Comment: Performed at [facility], [address] [place], [place] [address]  Ferritin     Status: None   Collection Time: 12/28/18  4:11 PM  Result Value Ref Range   Ferritin 90 24 - 336 ng/mL    Comment: Performed at [facility], [address], [place], [place] [address]  Reticulocytes     Status: Abnormal   Collection Time: 12/28/18  4:11 PM  Result Value Ref Range   Retic Ct Pct 2.5 0.4 - 3.1 %   RBC. 2.89 (L) 4.22 - 5.81 MIL/uL   Retic Count, Absolute 72.0 19.0 - 186.0 K/uL   Immature Retic Fract 27.6 (H) 2.3 - 15.9 %    Comment: Performed at [facility], [address], [place], [place] [address]  SARS CORONAVIRUS 2 (TAT 6-24 HRS) Nasopharyngeal Nasopharyngeal Swab     Status: None   Collection Time: 12/28/18  4:43 PM   Specimen: Nasopharyngeal Swab  Result Value Ref Range   SARS Coronavirus 2 NEGATIVE NEGATIVE    Comment: (NOTE) SARS-CoV-2 target nucleic acids are NOT DETECTED. The SARS-CoV-2 RNA is generally detectable in upper and lower respiratory specimens during the acute phase of infection. Negative results do not preclude SARS-CoV-2 infection, do not rule out co-infections with other pathogens, and  should not be used as the sole basis for treatment or other patient management decisions. Negative results must be combined with clinical observations, patient history, and epidemiological information. The expected result is Negative. Fact Sheet for Patients: SugarRoll.be Fact Sheet for Healthcare Providers: https://www.woods-mathews.com/ This test is not yet approved or cleared by  the [employer] and  has been authorized for detection and/or diagnosis of SARS-CoV-2 by FDA under an Emergency Use Authorization (EUA). This EUA will remain  in effect (meaning this test can be used) for the duration of the COVID-19 declaration under Section 56 4(b)(1) of the Act, 21 U.S.C. section 360bbb-3(b)(1), unless the authorization is terminated or revoked sooner. Performed at [facility], [place] [address], [place], [place] [address]   Troponin I (High Sensitivity)     Status: Abnormal   Collection Time: 12/28/18  5:34 PM  Result Value Ref Range   Troponin I (High Sensitivity) 195 (HH) <18 ng/L    Comment: CRITICAL VALUE NOTED.  VALUE IS CONSISTENT WITH PREVIOUSLY REPORTED AND CALLED VALUE. (NOTE) Elevated high sensitivity troponin I (hsTnI) values and significant  changes across serial measurements may suggest ACS but many other  chronic and acute conditions are known to elevate hsTnI results.  Refer to the Links section for chest pain algorithms and additional  guidance. Performed at [facility], [address] [place], [place] [address]     Chemistries  Recent Labs  Lab 12/28/18 1611  NA 140  K 4.0  CL 112*  CO2 20*  GLUCOSE 130*  BUN 17  CREATININE 1.32*  CALCIUM 8.5*  AST 22  ALT 21  ALKPHOS 73  BILITOT 0.6   ------------------------------------------------------------------------------------------------------------------   ------------------------------------------------------------------------------------------------------------------ GFR: Estimated Creatinine Clearance: 51.5 mL/min (A) (by C-G formula based on SCr of 1.32 mg/dL (H)). Liver Function Tests: Recent Labs  Lab 12/28/18 1611  AST 22  ALT 21  ALKPHOS 73  BILITOT 0.6  PROT 6.3*  ALBUMIN 3.2*   No results for input(s): LIPASE, AMYLASE in the last 168 hours. No results for input(s): AMMONIA in the last 168 hours. Coagulation Profile: Recent Labs  Lab 12/28/18 1611  INR 1.1   Cardiac Enzymes: No results for input(s): CKTOTAL, CKMB, CKMBINDEX, TROPONINI in the last 168 hours. BNP (last 3 results) No results for input(s): PROBNP in the last 8760 hours. HbA1C: No results for input(s): HGBA1C in the last 72 hours. CBG: No results for input(s): GLUCAP in the last 168 hours. Lipid Profile: No results for input(s): CHOL, HDL, LDLCALC, TRIG, CHOLHDL, LDLDIRECT in the last 72 hours. Thyroid Function Tests: No results for input(s): TSH, T4TOTAL, FREET4, T3FREE, THYROIDAB in the last 72 hours. Anemia Panel: Recent Labs    12/28/18 1611  VITAMINB12 119*  FOLATE 9.1  FERRITIN 90  TIBC 270  IRON 34*  RETICCTPCT 2.5    --------------------------------------------------------------------------------------------------------------- Urine analysis: No results found for: COLORURINE, APPEARANCEUR, LABSPEC, PHURINE, GLUCOSEU, HGBUR, BILIRUBINUR, KETONESUR, PROTEINUR, UROBILINOGEN, NITRITE, LEUKOCYTESUR    Imaging Results:    Ct Angio Chest Pe W And/or Wo Contrast  Result Date: 12/28/2018 CLINICAL DATA:  Shortness of breath EXAM: CT ANGIOGRAPHY CHEST WITH CONTRAST TECHNIQUE: Multidetector CT imaging of the chest was performed using the standard protocol during bolus administration of intravenous contrast. Multiplanar CT image reconstructions and MIPs were obtained to evaluate the vascular anatomy. CONTRAST:  100 mL Isovue 370 intravenous  COMPARISON:  CT 11/18/2018, chest x-ray 12/28/2018 FINDINGS: Cardiovascular: Satisfactory opacification of the pulmonary arteries to the segmental level. No evidence of pulmonary embolism. Nonaneurysmal aorta. Moderate aortic atherosclerosis. Coronary vascular calcification. Mild cardiomegaly. No pericardial effusion. Mediastinum/Nodes: Midline trachea. No thyroid mass. Subcentimeter mediastinal lymph nodes. Mild circumferential wall thickening of the distal esophagus. Lungs/Pleura: New small bilateral pleural effusions. Diffuse bilateral septal thickening. Partial resolution of previously noted primarily peripheral ground-glass densities. No new  ground-glass abnormality. Partial atelectasis at both lower lobes. Upper Abdomen: No acute abnormality. Musculoskeletal: No chest wall abnormality. No acute or significant osseous findings. Review of the MIP images confirms the above findings. IMPRESSION: 1. Negative for acute pulmonary embolus. 2. Cardiomegaly with interim development of small bilateral pleural effusions. Diffuse bilateral septal thickening suggesting mild pulmonary edema. 3. Improved appearance of previously noted peripheral ground-glass densities with mild residual density in the upper lobes. Partial atelectasis at the lower lobes. Aortic Atherosclerosis (ICD10-I70.0). Electronically Signed   By: Donavan Foil M.D.   On: 12/28/2018 21:29   Dg Chest Port 1 View  Result Date: 12/28/2018 CLINICAL DATA:  Acute shortness of breath EXAM: PORTABLE CHEST 1 VIEW COMPARISON:  11/20/2018 and prior radiographs FINDINGS: Cardiomegaly and mild pulmonary vascular congestion noted. Elevated RIGHT hemidiaphragm again identified. There is no evidence of focal airspace disease, pulmonary edema, suspicious pulmonary nodule/mass, pleural effusion, or pneumothorax. No acute bony abnormalities are identified. IMPRESSION: 1. Cardiomegaly with mild pulmonary vascular congestion. Electronically Signed   By: Margarette Canada M.D.    On: 12/28/2018 17:00       Assessment & Plan:    Principal Problem:   Dyspnea Active Problems:   CAD (coronary artery disease)   DM2 (diabetes mellitus, type 2) (HCC)   COVID-19 virus infection   Acute CHF (congestive heart failure) (HCC)  Dyspnea secondary to sequela of covid -19 infection ddx mild acute CHF , CAD (3vessel)  Mild Acute CHF Tele Trop I  Check cardiac echo  Cont Ramipril 10mg  po qday Lasix 20mg  iv qday  Elevated troponin Check cardiac echo Consider cardiology consult in AM  CAD  Cont Imudr 30mg  po qday Cont Amlodipine 10mg  po qday Cont Ramipril 10mg  po qday  Dm2 STOP Metformin ? CHF Cont Glucotrol 10mg  po bid fsbs ac and qhs,  ISS  H/o Gout Cont Allopurinol 300mg  poq day  DVT Prophylaxis-   Lovenox - SCDs   AM Labs Ordered, also please review Full Orders  Family Communication: Admission, patients condition and plan of care including tests being ordered have been discussed with the patient  who indicate understanding and agree with the plan and Code Status.  Code Status:  FULL CODE per patient, left message for daughter will be admitted to [facility]  Admission status:observation: Based on patients clinical presentation and evaluation of above clinical data, I have made determination that patient meets observtion criteria at this time   Time spent in minutes :  70 minutes   Jani Gravel M.D on 12/29/2018 at 1:05 AM

## 2018-12-28 NOTE — ED Triage Notes (Addendum)
Pt states that he was sent to urc for sob by his pcp. He did have a nstemi in the middle of sept he also tested positive for COVID then also.

## 2018-12-29 ENCOUNTER — Other Ambulatory Visit (HOSPITAL_COMMUNITY): Payer: Medicare Other

## 2018-12-29 ENCOUNTER — Observation Stay (HOSPITAL_BASED_OUTPATIENT_CLINIC_OR_DEPARTMENT_OTHER): Payer: Medicare Other

## 2018-12-29 DIAGNOSIS — R0600 Dyspnea, unspecified: Secondary | ICD-10-CM

## 2018-12-29 DIAGNOSIS — D631 Anemia in chronic kidney disease: Secondary | ICD-10-CM | POA: Diagnosis present

## 2018-12-29 DIAGNOSIS — E1165 Type 2 diabetes mellitus with hyperglycemia: Secondary | ICD-10-CM | POA: Diagnosis present

## 2018-12-29 DIAGNOSIS — K5900 Constipation, unspecified: Secondary | ICD-10-CM | POA: Diagnosis not present

## 2018-12-29 DIAGNOSIS — R0602 Shortness of breath: Secondary | ICD-10-CM | POA: Diagnosis present

## 2018-12-29 DIAGNOSIS — I252 Old myocardial infarction: Secondary | ICD-10-CM | POA: Diagnosis not present

## 2018-12-29 DIAGNOSIS — I509 Heart failure, unspecified: Secondary | ICD-10-CM

## 2018-12-29 DIAGNOSIS — N400 Enlarged prostate without lower urinary tract symptoms: Secondary | ICD-10-CM | POA: Diagnosis present

## 2018-12-29 DIAGNOSIS — Z885 Allergy status to narcotic agent status: Secondary | ICD-10-CM | POA: Diagnosis not present

## 2018-12-29 DIAGNOSIS — E785 Hyperlipidemia, unspecified: Secondary | ICD-10-CM | POA: Diagnosis present

## 2018-12-29 DIAGNOSIS — Z8249 Family history of ischemic heart disease and other diseases of the circulatory system: Secondary | ICD-10-CM | POA: Diagnosis not present

## 2018-12-29 DIAGNOSIS — I5043 Acute on chronic combined systolic (congestive) and diastolic (congestive) heart failure: Secondary | ICD-10-CM | POA: Diagnosis present

## 2018-12-29 DIAGNOSIS — Z823 Family history of stroke: Secondary | ICD-10-CM | POA: Diagnosis not present

## 2018-12-29 DIAGNOSIS — I340 Nonrheumatic mitral (valve) insufficiency: Secondary | ICD-10-CM

## 2018-12-29 DIAGNOSIS — I471 Supraventricular tachycardia: Secondary | ICD-10-CM | POA: Diagnosis present

## 2018-12-29 DIAGNOSIS — E1122 Type 2 diabetes mellitus with diabetic chronic kidney disease: Secondary | ICD-10-CM

## 2018-12-29 DIAGNOSIS — U071 COVID-19: Secondary | ICD-10-CM | POA: Diagnosis not present

## 2018-12-29 DIAGNOSIS — M5030 Other cervical disc degeneration, unspecified cervical region: Secondary | ICD-10-CM | POA: Diagnosis present

## 2018-12-29 DIAGNOSIS — I10 Essential (primary) hypertension: Secondary | ICD-10-CM | POA: Diagnosis not present

## 2018-12-29 DIAGNOSIS — I130 Hypertensive heart and chronic kidney disease with heart failure and stage 1 through stage 4 chronic kidney disease, or unspecified chronic kidney disease: Secondary | ICD-10-CM | POA: Diagnosis present

## 2018-12-29 DIAGNOSIS — E876 Hypokalemia: Secondary | ICD-10-CM | POA: Diagnosis not present

## 2018-12-29 DIAGNOSIS — I5021 Acute systolic (congestive) heart failure: Secondary | ICD-10-CM | POA: Diagnosis not present

## 2018-12-29 DIAGNOSIS — B948 Sequelae of other specified infectious and parasitic diseases: Secondary | ICD-10-CM | POA: Diagnosis not present

## 2018-12-29 DIAGNOSIS — Z7982 Long term (current) use of aspirin: Secondary | ICD-10-CM | POA: Diagnosis not present

## 2018-12-29 DIAGNOSIS — Z7984 Long term (current) use of oral hypoglycemic drugs: Secondary | ICD-10-CM | POA: Diagnosis not present

## 2018-12-29 DIAGNOSIS — N183 Chronic kidney disease, stage 3 unspecified: Secondary | ICD-10-CM | POA: Diagnosis present

## 2018-12-29 DIAGNOSIS — I25118 Atherosclerotic heart disease of native coronary artery with other forms of angina pectoris: Secondary | ICD-10-CM | POA: Diagnosis not present

## 2018-12-29 DIAGNOSIS — Z79899 Other long term (current) drug therapy: Secondary | ICD-10-CM | POA: Diagnosis not present

## 2018-12-29 DIAGNOSIS — I5041 Acute combined systolic (congestive) and diastolic (congestive) heart failure: Secondary | ICD-10-CM | POA: Diagnosis not present

## 2018-12-29 DIAGNOSIS — I2510 Atherosclerotic heart disease of native coronary artery without angina pectoris: Secondary | ICD-10-CM | POA: Diagnosis present

## 2018-12-29 DIAGNOSIS — M109 Gout, unspecified: Secondary | ICD-10-CM | POA: Diagnosis present

## 2018-12-29 LAB — COMPREHENSIVE METABOLIC PANEL
ALT: 18 U/L (ref 0–44)
AST: 20 U/L (ref 15–41)
Albumin: 3 g/dL — ABNORMAL LOW (ref 3.5–5.0)
Alkaline Phosphatase: 69 U/L (ref 38–126)
Anion gap: 11 (ref 5–15)
BUN: 18 mg/dL (ref 8–23)
CO2: 22 mmol/L (ref 22–32)
Calcium: 8.8 mg/dL — ABNORMAL LOW (ref 8.9–10.3)
Chloride: 108 mmol/L (ref 98–111)
Creatinine, Ser: 1.24 mg/dL (ref 0.61–1.24)
GFR calc Af Amer: >60 mL/min (ref >60)
GFR calc non Af Amer: 54 mL/min — ABNORMAL LOW (ref >60)
Glucose, Bld: 172 mg/dL — ABNORMAL HIGH (ref 70–99)
Potassium: 3.9 mmol/L (ref 3.5–5.1)
Sodium: 141 mmol/L (ref 135–145)
Total Bilirubin: 0.7 mg/dL (ref 0.3–1.2)
Total Protein: 6.1 g/dL — ABNORMAL LOW (ref 6.5–8.1)

## 2018-12-29 LAB — TROPONIN I (HIGH SENSITIVITY): Troponin I (High Sensitivity): 160 ng/L (ref <18)

## 2018-12-29 LAB — ECHOCARDIOGRAM COMPLETE
Height: 70 in
Weight: 3579.2 oz

## 2018-12-29 LAB — GLUCOSE, CAPILLARY
Glucose-Capillary: 120 mg/dL — ABNORMAL HIGH (ref 70–99)
Glucose-Capillary: 138 mg/dL — ABNORMAL HIGH (ref 70–99)
Glucose-Capillary: 125 mg/dL — ABNORMAL HIGH (ref 70–99)
Glucose-Capillary: 149 mg/dL — ABNORMAL HIGH (ref 70–99)

## 2018-12-29 LAB — CBC
HCT: 30.5 % — ABNORMAL LOW (ref 39.0–52.0)
Hemoglobin: 9.6 g/dL — ABNORMAL LOW (ref 13.0–17.0)
MCH: 31.6 pg (ref 26.0–34.0)
MCHC: 31.5 g/dL (ref 30.0–36.0)
MCV: 100.3 fL — ABNORMAL HIGH (ref 80.0–100.0)
Platelets: 196 10*3/uL (ref 150–400)
RBC: 3.04 MIL/uL — ABNORMAL LOW (ref 4.22–5.81)
RDW: 16.1 % — ABNORMAL HIGH (ref 11.5–15.5)
WBC: 5.7 10*3/uL (ref 4.0–10.5)
nRBC: 0 % (ref 0.0–0.2)

## 2018-12-29 LAB — SARS CORONAVIRUS 2 (TAT 6-24 HRS): SARS Coronavirus 2: NEGATIVE

## 2018-12-29 LAB — HEMOGLOBIN A1C
Hgb A1c MFr Bld: 7.2 % — ABNORMAL HIGH (ref 4.8–5.6)
Mean Plasma Glucose: 159.94 mg/dL

## 2018-12-29 MED ORDER — SODIUM CHLORIDE 0.9 % IV SOLN: 250.0000 mL | INTRAVENOUS | Status: DC | PRN

## 2018-12-29 MED ORDER — ACETAMINOPHEN 325 MG PO TABS: 650.0000 mg | ORAL_TABLET | Freq: Four times a day (QID) | ORAL | Status: DC | PRN

## 2018-12-29 MED ORDER — ACETAMINOPHEN 650 MG RE SUPP: 650.0000 mg | Freq: Four times a day (QID) | RECTAL | Status: DC | PRN

## 2018-12-29 MED ORDER — INSULIN ASPART 100 UNIT/ML ~~LOC~~ SOLN
0.0000 [IU] | Freq: Three times a day (TID) | SUBCUTANEOUS | Status: DC
  Administered 2018-12-30: 3 [IU] via SUBCUTANEOUS

## 2018-12-29 MED ORDER — ALBUTEROL SULFATE (2.5 MG/3ML) 0.083% IN NEBU: 2.5000 mg | INHALATION_SOLUTION | Freq: Four times a day (QID) | RESPIRATORY_TRACT | Status: DC | PRN

## 2018-12-29 MED ORDER — LORATADINE 10 MG PO TABS
10.0000 mg | ORAL_TABLET | Freq: Every day | ORAL | Status: DC
  Administered 2018-12-29 – 2018-12-31 (×3): 10 mg via ORAL
  Filled 2018-12-29 (×3): qty 1

## 2018-12-29 MED ORDER — DOCUSATE SODIUM 100 MG PO CAPS
100.0000 mg | ORAL_CAPSULE | Freq: Two times a day (BID) | ORAL | Status: DC
  Administered 2018-12-29 – 2018-12-31 (×5): 100 mg via ORAL
  Filled 2018-12-29 (×5): qty 1

## 2018-12-29 MED ORDER — RAMIPRIL 10 MG PO CAPS
10.0000 mg | ORAL_CAPSULE | Freq: Two times a day (BID) | ORAL | Status: DC
  Administered 2018-12-29 – 2018-12-31 (×5): 10 mg via ORAL
  Filled 2018-12-29 (×6): qty 1

## 2018-12-29 MED ORDER — SODIUM CHLORIDE 0.9% FLUSH
3.0000 mL | Freq: Two times a day (BID) | INTRAVENOUS | Status: DC
  Administered 2018-12-29 – 2018-12-30 (×4): 3 mL via INTRAVENOUS

## 2018-12-29 MED ORDER — HYDROCODONE-ACETAMINOPHEN 5-325 MG PO TABS: 1.0000 | ORAL_TABLET | Freq: Every day | ORAL | Status: DC | PRN

## 2018-12-29 MED ORDER — FLUTICASONE PROPIONATE 50 MCG/ACT NA SUSP: 1.0000 | Freq: Every day | NASAL | Status: DC | PRN

## 2018-12-29 MED ORDER — INSULIN ASPART 100 UNIT/ML ~~LOC~~ SOLN: 0.0000 [IU] | Freq: Every day | SUBCUTANEOUS | Status: DC

## 2018-12-29 MED ORDER — DM-GUAIFENESIN ER 30-600 MG PO TB12
1.0000 | ORAL_TABLET | Freq: Two times a day (BID) | ORAL | Status: DC
  Administered 2018-12-29 – 2018-12-31 (×5): 1 via ORAL
  Filled 2018-12-29 (×5): qty 1

## 2018-12-29 MED ORDER — ALLOPURINOL 300 MG PO TABS
300.0000 mg | ORAL_TABLET | Freq: Every day | ORAL | Status: DC
  Administered 2018-12-29 – 2018-12-31 (×3): 300 mg via ORAL
  Filled 2018-12-29 (×3): qty 1

## 2018-12-29 MED ORDER — TAMSULOSIN HCL 0.4 MG PO CAPS
0.4000 mg | ORAL_CAPSULE | Freq: Every day | ORAL | Status: DC
  Administered 2018-12-29 – 2018-12-30 (×2): 0.4 mg via ORAL
  Filled 2018-12-29 (×2): qty 1

## 2018-12-29 MED ORDER — ENOXAPARIN SODIUM 40 MG/0.4ML ~~LOC~~ SOLN
40.0000 mg | SUBCUTANEOUS | Status: DC
  Administered 2018-12-29 – 2018-12-30 (×2): 40 mg via SUBCUTANEOUS
  Filled 2018-12-29 (×2): qty 0.4

## 2018-12-29 MED ORDER — ISOSORBIDE MONONITRATE ER 30 MG PO TB24
30.0000 mg | ORAL_TABLET | Freq: Every day | ORAL | Status: DC
  Administered 2018-12-29 – 2018-12-31 (×3): 30 mg via ORAL
  Filled 2018-12-29 (×3): qty 1

## 2018-12-29 MED ORDER — FUROSEMIDE 10 MG/ML IJ SOLN
20.0000 mg | Freq: Two times a day (BID) | INTRAMUSCULAR | Status: DC
  Administered 2018-12-29 – 2018-12-30 (×2): 20 mg via INTRAVENOUS
  Filled 2018-12-29 (×2): qty 2

## 2018-12-29 MED ORDER — GLIPIZIDE 10 MG PO TABS
10.0000 mg | ORAL_TABLET | Freq: Two times a day (BID) | ORAL | Status: DC
  Administered 2018-12-29 – 2018-12-31 (×5): 10 mg via ORAL
  Filled 2018-12-29 (×5): qty 1

## 2018-12-29 MED ORDER — AMLODIPINE BESYLATE 10 MG PO TABS
10.0000 mg | ORAL_TABLET | Freq: Every day | ORAL | Status: DC
  Administered 2018-12-29 – 2018-12-31 (×3): 10 mg via ORAL
  Filled 2018-12-29 (×3): qty 1

## 2018-12-29 MED ORDER — OMEGA-3-ACID ETHYL ESTERS 1 G PO CAPS
1.0000 g | ORAL_CAPSULE | Freq: Two times a day (BID) | ORAL | Status: DC
  Administered 2018-12-29 – 2018-12-31 (×5): 1 g via ORAL
  Filled 2018-12-29 (×5): qty 1

## 2018-12-29 MED ORDER — ASPIRIN EC 81 MG PO TBEC
81.0000 mg | DELAYED_RELEASE_TABLET | Freq: Every day | ORAL | Status: DC
  Administered 2018-12-29 – 2018-12-30 (×2): 81 mg via ORAL
  Filled 2018-12-29 (×2): qty 1

## 2018-12-29 MED ORDER — FUROSEMIDE 10 MG/ML IJ SOLN
20.0000 mg | Freq: Every day | INTRAMUSCULAR | Status: DC
  Administered 2018-12-29: 20 mg via INTRAVENOUS
  Filled 2018-12-29: qty 2

## 2018-12-29 MED ORDER — SODIUM CHLORIDE 0.9% FLUSH: 3.0000 mL | INTRAVENOUS | Status: DC | PRN

## 2018-12-29 NOTE — Progress Notes (Signed)
PROGRESS NOTE    Michael Cobb  [id number] DOB: [birth year] DOA: 12/28/2018 PCP: Sandi Mealy, MD  Brief Narrative: 84 year old male with prior h.o of hypertension, hyperlipidemia, DM, stage3 CKD, CAD, s/p NSTEMI IN 2019, h/o COVID pneumonia last month , presents to ED for worsening sob and leg edema and orthopnea.   He was transferred to [facility] for further evaluation.   Assessment & Plan:   Principal Problem:   Dyspnea Active Problems:   CAD (coronary artery disease)   DM2 (diabetes mellitus, type 2) (HCC)   COVID-19 virus infection   Acute CHF (congestive heart failure) (HCC)   Acute systolic and diastolic heart failure:  Admit to telemetry and started on IV lasix 20 mg BID, echocardiogram showed LVEF of 40 to 45%, inferior and lateral wall severe hypokinesis and diastolic dysfunction.  Pt has 3+ leg edema, sob on talking  Has orthopnea and high risk of deterioration if sent home on oral diuretics.  Elevated troponins probably from demand ischemia from chf.  EKG does not show ischemic changes.  Continue with strict intake and output.  Continue with daily weights.  Will need cardiology input in am.    DM: GET A1C .  Continue with SSI.    Hypertension:  Well controlled.    Hyperlipidemia:  continue with lovaza.    COVID 19 Infection: Screening test is negative.  Improving.      DVT prophylaxis: lovenox.  Code Status: full code.  Family Communication: none at bedside.  Disposition Plan:pending clinical improvement.    Consultants:   None.   Procedures: (echocardiogram.  Antimicrobials: none.   Subjective:  sob on talking , leg edema, urinating well with IV lasix.  Sob on laying down.   Objective: Vitals:   12/29/18 0000 12/29/18 0030 12/29/18 0700 12/29/18 0903  BP: (!) 148/78 (!) 152/76 (!) 142/68 (!) 172/79  Pulse: 68 71 67 72  Resp: 15 16    Temp:    98.3 F (36.8 C)  TempSrc:    Oral  SpO2: 93% 93% 95% 98%  Weight:    101.5 kg   Height:    5\' 10"  (1.778 m)    Intake/Output Summary (Last 24 hours) at 12/29/2018 0940 Last data filed at 12/29/2018 0905 Gross per 24 hour  Intake -  Output 1400 ml  Net -1400 ml   Filed Weights   12/28/18 1349 12/29/18 0903  Weight: 101.6 kg 101.5 kg    Examination:  General exam: mild distress from sob on talking.  Respiratory system: diminished at bases, tachypnea is present.  Cardiovascular system: S1 & S2 heard, RRR. 3+leg edema.  Gastrointestinal system: Abdomen is nondistended, soft and nontender. No organomegaly or masses felt. Normal bowel sounds heard. Central nervous system: Alert and oriented. No focal neurological deficits. Extremities:3+ leg edema.  Skin: No rashes, lesions or ulcers Psychiatry:  Mood & affect appropriate.     Data Reviewed: I have personally reviewed following labs and imaging studies  CBC: Recent Labs  Lab 12/28/18 1611 12/29/18 0545  WBC 5.9 5.7  HGB 9.1* 9.6*  HCT 29.9* 30.5*  MCV 102.4* 100.3*  PLT 203 [id number]   Basic Metabolic Panel: Recent Labs  Lab 12/28/18 1611 12/29/18 0545  NA 140 141  K 4.0 3.9  CL 112* 108  CO2 20* 22  GLUCOSE 130* 172*  BUN 17 18  CREATININE 1.32* 1.24  CALCIUM 8.5* 8.8*   GFR: Estimated Creatinine Clearance: 54.8 mL/min (by C-G formula based on SCr  of 1.24 mg/dL). Liver Function Tests: Recent Labs  Lab 12/28/18 1611 12/29/18 0545  AST 22 20  ALT 21 18  ALKPHOS 73 69  BILITOT 0.6 0.7  PROT 6.3* 6.1*  ALBUMIN 3.2* 3.0*   No results for input(s): LIPASE, AMYLASE in the last 168 hours. No results for input(s): AMMONIA in the last 168 hours. Coagulation Profile: Recent Labs  Lab 12/28/18 1611  INR 1.1   Cardiac Enzymes: No results for input(s): CKTOTAL, CKMB, CKMBINDEX, TROPONINI in the last 168 hours. BNP (last 3 results) No results for input(s): PROBNP in the last 8760 hours. HbA1C: No results for input(s): HGBA1C in the last 72 hours. CBG: Recent Labs  Lab 12/29/18 0918   GLUCAP 120*   Lipid Profile: No results for input(s): CHOL, HDL, LDLCALC, TRIG, CHOLHDL, LDLDIRECT in the last 72 hours. Thyroid Function Tests: No results for input(s): TSH, T4TOTAL, FREET4, T3FREE, THYROIDAB in the last 72 hours. Anemia Panel: Recent Labs    12/28/18 1611  VITAMINB12 119*  FOLATE 9.1  FERRITIN 90  TIBC 270  IRON 34*  RETICCTPCT 2.5   Sepsis Labs: No results for input(s): PROCALCITON, LATICACIDVEN in the last 168 hours.  Recent Results (from the past 240 hour(s))  SARS CORONAVIRUS 2 (TAT 6-24 HRS) Nasopharyngeal Nasopharyngeal Swab     Status: None   Collection Time: 12/28/18  4:43 PM   Specimen: Nasopharyngeal Swab  Result Value Ref Range Status   SARS Coronavirus 2 NEGATIVE NEGATIVE Final    Comment: (NOTE) SARS-CoV-2 target nucleic acids are NOT DETECTED. The SARS-CoV-2 RNA is generally detectable in upper and lower respiratory specimens during the acute phase of infection. Negative results do not preclude SARS-CoV-2 infection, do not rule out co-infections with other pathogens, and should not be used as the sole basis for treatment or other patient management decisions. Negative results must be combined with clinical observations, patient history, and epidemiological information. The expected result is Negative. Fact Sheet for Patients: SugarRoll.be Fact Sheet for Healthcare Providers: https://www.woods-mathews.com/ This test is not yet approved or cleared by the [place] FDA and  has been authorized for detection and/or diagnosis of SARS-CoV-2 by FDA under an Emergency Use Authorization (EUA). This EUA will remain  in effect (meaning this test can be used) for the duration of the COVID-19 declaration under Section 56 4(b)(1) of the Act, 21 U.S.C. section 360bbb-3(b)(1), unless the authorization is terminated or revoked sooner. Performed at [facility], [place] [address] [place], [place]  [address]          Radiology Studies: Ct Angio Chest Pe W And/or Wo Contrast  Result Date: 12/28/2018 CLINICAL DATA:  Shortness of breath EXAM: CT ANGIOGRAPHY CHEST WITH CONTRAST TECHNIQUE: Multidetector CT imaging of the chest was performed using the standard protocol during bolus administration of intravenous contrast. Multiplanar CT image reconstructions and MIPs were obtained to evaluate the vascular anatomy. CONTRAST:  100 mL Isovue 370 intravenous COMPARISON:  CT 11/18/2018, chest x-ray 12/28/2018 FINDINGS: Cardiovascular: Satisfactory opacification of the pulmonary arteries to the segmental level. No evidence of pulmonary embolism. Nonaneurysmal aorta. Moderate aortic atherosclerosis. Coronary vascular calcification. Mild cardiomegaly. No pericardial effusion. Mediastinum/Nodes: Midline trachea. No thyroid mass. Subcentimeter mediastinal lymph nodes. Mild circumferential wall thickening of the distal esophagus. Lungs/Pleura: New small bilateral pleural effusions. Diffuse bilateral septal thickening. Partial resolution of previously noted primarily peripheral ground-glass densities. No new ground-glass abnormality. Partial atelectasis at both lower lobes. Upper Abdomen: No acute abnormality. Musculoskeletal: No chest wall abnormality. No acute or  significant osseous findings. Review of the MIP images confirms the above findings. IMPRESSION: 1. Negative for acute pulmonary embolus. 2. Cardiomegaly with interim development of small bilateral pleural effusions. Diffuse bilateral septal thickening suggesting mild pulmonary edema. 3. Improved appearance of previously noted peripheral ground-glass densities with mild residual density in the upper lobes. Partial atelectasis at the lower lobes. Aortic Atherosclerosis (ICD10-I70.0). Electronically Signed   By: Donavan Foil M.D.   On: 12/28/2018 21:29   Dg Chest Port 1 View  Result Date: 12/28/2018 CLINICAL DATA:  Acute shortness of breath EXAM: PORTABLE  CHEST 1 VIEW COMPARISON:  11/20/2018 and prior radiographs FINDINGS: Cardiomegaly and mild pulmonary vascular congestion noted. Elevated RIGHT hemidiaphragm again identified. There is no evidence of focal airspace disease, pulmonary edema, suspicious pulmonary nodule/mass, pleural effusion, or pneumothorax. No acute bony abnormalities are identified. IMPRESSION: 1. Cardiomegaly with mild pulmonary vascular congestion. Electronically Signed   By: Margarette Canada M.D.   On: 12/28/2018 17:00        Scheduled Meds: . allopurinol  300 mg Oral Daily  . amLODipine  10 mg Oral Daily  . aspirin EC  81 mg Oral QHS  . dextromethorphan-guaiFENesin  1 tablet Oral BID  . docusate sodium  100 mg Oral BID  . enoxaparin (LOVENOX) injection  40 mg Subcutaneous Q24H  . furosemide  20 mg Intravenous Daily  . glipiZIDE  10 mg Oral BID AC  . isosorbide mononitrate  30 mg Oral Daily  . loratadine  10 mg Oral Daily  . omega-3 acid ethyl esters  1 g Oral BID  . ramipril  10 mg Oral BID  . sodium chloride flush  3 mL Intravenous Once  . sodium chloride flush  3 mL Intravenous Q12H  . tamsulosin  0.4 mg Oral QHS   Continuous Infusions: . sodium chloride       LOS: 0 days        Hosie Poisson, MD [facility] Pager [phone]  If 7PM-7AM, please contact night-coverage [email or web address] Password [email or web address] 12/29/2018, 9:40 AM

## 2018-12-29 NOTE — ED Notes (Signed)
Date and time results received: 12/29/18 Not otherwise examined today.   (use smartphrase ".now" to insert current time)  Test: troponin Critical Value: 160  Name of Provider Notified: Dr Wyvonnia Dusky  Orders Received? Or Actions Taken?: see chart

## 2018-12-29 NOTE — Plan of Care (Signed)
Patient remains pain free, ambulating in room VSS.

## 2018-12-29 NOTE — Progress Notes (Signed)
  Echocardiogram 2D Echocardiogram has been performed.  Michael Cobb 12/29/2018, 10:30 AM

## 2018-12-30 DIAGNOSIS — R0600 Dyspnea, unspecified: Secondary | ICD-10-CM | POA: Diagnosis not present

## 2018-12-30 DIAGNOSIS — I10 Essential (primary) hypertension: Secondary | ICD-10-CM

## 2018-12-30 DIAGNOSIS — N183 Chronic kidney disease, stage 3 unspecified: Secondary | ICD-10-CM | POA: Diagnosis not present

## 2018-12-30 DIAGNOSIS — I472 Ventricular tachycardia: Secondary | ICD-10-CM

## 2018-12-30 DIAGNOSIS — I509 Heart failure, unspecified: Secondary | ICD-10-CM | POA: Diagnosis not present

## 2018-12-30 DIAGNOSIS — E1122 Type 2 diabetes mellitus with diabetic chronic kidney disease: Secondary | ICD-10-CM | POA: Diagnosis not present

## 2018-12-30 DIAGNOSIS — I5021 Acute systolic (congestive) heart failure: Secondary | ICD-10-CM | POA: Diagnosis not present

## 2018-12-30 DIAGNOSIS — I25118 Atherosclerotic heart disease of native coronary artery with other forms of angina pectoris: Secondary | ICD-10-CM | POA: Diagnosis not present

## 2018-12-30 DIAGNOSIS — K5900 Constipation, unspecified: Secondary | ICD-10-CM

## 2018-12-30 DIAGNOSIS — U071 COVID-19: Secondary | ICD-10-CM | POA: Diagnosis not present

## 2018-12-30 LAB — CBC
HCT: 28 % — ABNORMAL LOW (ref 39.0–52.0)
Hemoglobin: 9.4 g/dL — ABNORMAL LOW (ref 13.0–17.0)
MCH: 32.1 pg (ref 26.0–34.0)
MCHC: 33.6 g/dL (ref 30.0–36.0)
MCV: 95.6 fL (ref 80.0–100.0)
Platelets: 199 10*3/uL (ref 150–400)
RBC: 2.93 MIL/uL — ABNORMAL LOW (ref 4.22–5.81)
RDW: 15.4 % (ref 11.5–15.5)
WBC: 4.7 10*3/uL (ref 4.0–10.5)
nRBC: 0 % (ref 0.0–0.2)

## 2018-12-30 LAB — GLUCOSE, CAPILLARY
Glucose-Capillary: 106 mg/dL — ABNORMAL HIGH (ref 70–99)
Glucose-Capillary: 201 mg/dL — ABNORMAL HIGH (ref 70–99)
Glucose-Capillary: 89 mg/dL (ref 70–99)
Glucose-Capillary: 161 mg/dL — ABNORMAL HIGH (ref 70–99)
Glucose-Capillary: 134 mg/dL — ABNORMAL HIGH (ref 70–99)

## 2018-12-30 LAB — BASIC METABOLIC PANEL
Anion gap: 12 (ref 5–15)
BUN: 16 mg/dL (ref 8–23)
CO2: 23 mmol/L (ref 22–32)
Calcium: 8.7 mg/dL — ABNORMAL LOW (ref 8.9–10.3)
Chloride: 103 mmol/L (ref 98–111)
Creatinine, Ser: 1.38 mg/dL — ABNORMAL HIGH (ref 0.61–1.24)
GFR calc Af Amer: 55 mL/min — ABNORMAL LOW (ref >60)
GFR calc non Af Amer: 47 mL/min — ABNORMAL LOW (ref >60)
Glucose, Bld: 102 mg/dL — ABNORMAL HIGH (ref 70–99)
Potassium: 3.5 mmol/L (ref 3.5–5.1)
Sodium: 138 mmol/L (ref 135–145)

## 2018-12-30 LAB — MAGNESIUM: Magnesium: 1.5 mg/dL — ABNORMAL LOW (ref 1.7–2.4)

## 2018-12-30 MED ORDER — FERROUS SULFATE 325 (65 FE) MG PO TABS
325.0000 mg | ORAL_TABLET | Freq: Two times a day (BID) | ORAL | Status: DC
  Administered 2018-12-30 – 2018-12-31 (×2): 325 mg via ORAL
  Filled 2018-12-30 (×2): qty 1

## 2018-12-30 MED ORDER — MAGNESIUM SULFATE 2 GM/50ML IV SOLN
2.0000 g | Freq: Once | INTRAVENOUS | Status: AC
  Administered 2018-12-30: 2 g via INTRAVENOUS
  Filled 2018-12-30: qty 50

## 2018-12-30 MED ORDER — VITAMIN B-12 1000 MCG PO TABS
1000.0000 ug | ORAL_TABLET | Freq: Every day | ORAL | Status: DC
  Administered 2018-12-30 – 2018-12-31 (×2): 1000 ug via ORAL
  Filled 2018-12-30 (×2): qty 1

## 2018-12-30 MED ORDER — FUROSEMIDE 40 MG PO TABS
40.0000 mg | ORAL_TABLET | Freq: Every day | ORAL | Status: DC
  Administered 2018-12-31: 40 mg via ORAL
  Filled 2018-12-30: qty 1

## 2018-12-30 MED ORDER — POTASSIUM CHLORIDE CRYS ER 20 MEQ PO TBCR
20.0000 meq | EXTENDED_RELEASE_TABLET | Freq: Two times a day (BID) | ORAL | Status: DC
  Administered 2018-12-30 – 2018-12-31 (×3): 20 meq via ORAL
  Filled 2018-12-30 (×3): qty 1

## 2018-12-30 NOTE — Progress Notes (Signed)
PROGRESS NOTE    Michael Cobb  [id number] DOB: February 03, 1935 DOA: 12/28/2018 PCP: Sandi Mealy, MD  Brief Narrative: 84 year old male with prior h.o of hypertension, hyperlipidemia, DM, stage3 CKD, CAD, s/p NSTEMI IN 2019, h/o COVID pneumonia last month , presents to ED for worsening sob and leg edema and orthopnea.   He was transferred to [facility] for further evaluation.  EKG does not show any ischemic changes at this time.  His troponins were elevated in 190s.  Echocardiogram done showed left ventricular ejection fraction of 40 to 45% with severe hypokinesis of inferior lateral walls with grade 1 diastolic dysfunction.  He was started on IV Lasix 20 mg twice daily and he has diuresed appropriately Cardiology consulted and recommended to change to IV Lasix 40 mg daily.  Assessment & Plan:   Principal Problem:   Dyspnea Active Problems:   CAD (coronary artery disease)   DM2 (diabetes mellitus, type 2) (HCC)   COVID-19 virus infection   Acute CHF (congestive heart failure) (HCC)   Acute systolic and diastolic heart failure:  Admit to telemetry and started on IV lasix 20 mg BID later on transition to 40 mg daily by cardiology. , echocardiogram showed LVEF of 40 to 45%, inferior and lateral wall severe hypokinesis and diastolic dysfunction.  He has diuresed more than 4 L in the last 48 hours and continues to diurese. Elevated troponins probably from demand ischemia from chf.  EKG does not show ischemic changes.  Continue with strict intake and output.  Continue with daily weights.  Cardiology consulted and recommendations given.   DM: With hyperglycemia CBG (last 3)  Recent Labs    12/30/18 1126 12/30/18 1619 12/30/18 1731  GLUCAP 201* 89 161*  continue with SSI.  Hemoglobin A1c 7.2.    Severe triple-vessel coronary artery disease Denies any chest pain at this time.  Cardiology recommends to continue with aspirin 81 mg.    Hypertension:  Well controlled.     Hyperlipidemia:  continue with lovaza.    COVID 19 Infection: Screening test is negative.  Improving.   Hypokalemia Replaced    Hypomagnesemia Replaced    Anemia of chronic disease/iron deficiency anemia/vitamin B12 deficiency anemia Hemoglobin stable around 9.4  Anemia panel shows low iron levels which will be replaced and low vitamin B12 levels which will be replaced.  Stage III CKD Baseline creatinine 1.7 Currently rates at 1.3.   DVT prophylaxis: lovenox.  Code Status: full code.  Family Communication: none at bedside.  Disposition Plan: Possible discharge tomorrow   Consultants:   Cardiology  Procedures: echocardiogram.  Antimicrobials: none.   Subjective: Shortness of breath has improved, no chest pain, no nausea vomiting or abdominal pain persistent leg edema  Objective: Vitals:   12/29/18 1430 12/29/18 2043 12/30/18 0438 12/30/18 0900  BP: (!) 143/78 (!) 151/73 (!) 141/67 140/74  Pulse: 68 66 65   Resp: 17  16   Temp: 97.7 F (36.5 C) 98.7 F (37.1 C) 99 F (37.2 C)   TempSrc: Oral Oral Oral   SpO2: 98% 97% 96%   Weight:   97.7 kg   Height:        Intake/Output Summary (Last 24 hours) at 12/30/2018 1129 Last data filed at 12/30/2018 1012 Gross per 24 hour  Intake 1320 ml  Output 3555 ml  Net -2235 ml   Filed Weights   12/28/18 1349 12/29/18 0903 12/30/18 0438  Weight: 101.6 kg 101.5 kg 97.7 kg    Examination:  General exam: Alert and not in any kind of distress. Respiratory system: Diminished air entry at bases, no wheezing or rhonchi, tachypnea present Cardiovascular system: S1-S2 heard, regular rate rhythm.  Improving leg edema  Gastrointestinal system: Diminished soft, nontender, nondistended, normal bowel sounds Central nervous system: Alert and oriented, no focal deficits Extremities: Improving leg edema Skin: No rashes Psychiatry: Mood is appropriate    Data Reviewed: I have personally reviewed following labs and  imaging studies  CBC: Recent Labs  Lab 12/28/18 1611 12/29/18 0545 12/30/18 0439  WBC 5.9 5.7 4.7  HGB 9.1* 9.6* 9.4*  HCT 29.9* 30.5* 28.0*  MCV 102.4* 100.3* 95.6  PLT 203 196 [id number]   Basic Metabolic Panel: Recent Labs  Lab 12/28/18 1611 12/29/18 0545 12/30/18 0439  NA 140 141 138  K 4.0 3.9 3.5  CL 112* 108 103  CO2 20* 22 23  GLUCOSE 130* 172* 102*  BUN 17 18 16   CREATININE 1.32* 1.24 1.38*  CALCIUM 8.5* 8.8* 8.7*   GFR: Estimated Creatinine Clearance: 48.4 mL/min (A) (by C-G formula based on SCr of 1.38 mg/dL (H)). Liver Function Tests: Recent Labs  Lab 12/28/18 1611 12/29/18 0545  AST 22 20  ALT 21 18  ALKPHOS 73 69  BILITOT 0.6 0.7  PROT 6.3* 6.1*  ALBUMIN 3.2* 3.0*   No results for input(s): LIPASE, AMYLASE in the last 168 hours. No results for input(s): AMMONIA in the last 168 hours. Coagulation Profile: Recent Labs  Lab 12/28/18 1611  INR 1.1   Cardiac Enzymes: No results for input(s): CKTOTAL, CKMB, CKMBINDEX, TROPONINI in the last 168 hours. BNP (last 3 results) No results for input(s): PROBNP in the last 8760 hours. HbA1C: Recent Labs    12/29/18 1833  HGBA1C 7.2*   CBG: Recent Labs  Lab 12/29/18 1214 12/29/18 1654 12/29/18 2044 12/30/18 0737 12/30/18 1126  GLUCAP 138* 125* 149* 106* 201*   Lipid Profile: No results for input(s): CHOL, HDL, LDLCALC, TRIG, CHOLHDL, LDLDIRECT in the last 72 hours. Thyroid Function Tests: No results for input(s): TSH, T4TOTAL, FREET4, T3FREE, THYROIDAB in the last 72 hours. Anemia Panel: Recent Labs    12/28/18 1611  VITAMINB12 119*  FOLATE 9.1  FERRITIN 90  TIBC 270  IRON 34*  RETICCTPCT 2.5   Sepsis Labs: No results for input(s): PROCALCITON, LATICACIDVEN in the last 168 hours.  Recent Results (from the past 240 hour(s))  SARS CORONAVIRUS 2 (TAT 6-24 HRS) Nasopharyngeal Nasopharyngeal Swab     Status: None   Collection Time: 12/28/18  4:43 PM   Specimen: Nasopharyngeal Swab  Result  Value Ref Range Status   SARS Coronavirus 2 NEGATIVE NEGATIVE Final    Comment: (NOTE) SARS-CoV-2 target nucleic acids are NOT DETECTED. The SARS-CoV-2 RNA is generally detectable in upper and lower respiratory specimens during the acute phase of infection. Negative results do not preclude SARS-CoV-2 infection, do not rule out co-infections with other pathogens, and should not be used as the sole basis for treatment or other patient management decisions. Negative results must be combined with clinical observations, patient history, and epidemiological information. The expected result is Negative. Fact Sheet for Patients: SugarRoll.be Fact Sheet for Healthcare Providers: https://www.woods-mathews.com/ This test is not yet approved or cleared by the [place] FDA and  has been authorized for detection and/or diagnosis of SARS-CoV-2 by FDA under an Emergency Use Authorization (EUA). This EUA will remain  in effect (meaning this test can be used) for the duration of the COVID-19 declaration under Section 56  4(b)(1) of the Act, 21 U.S.C. section 360bbb-3(b)(1), unless the authorization is terminated or revoked sooner. Performed at [facility], [place] [address], [place], [place] [address]          Radiology Studies: Ct Angio Chest Pe W And/or Wo Contrast  Result Date: 12/28/2018 CLINICAL DATA:  Shortness of breath EXAM: CT ANGIOGRAPHY CHEST WITH CONTRAST TECHNIQUE: Multidetector CT imaging of the chest was performed using the standard protocol during bolus administration of intravenous contrast. Multiplanar CT image reconstructions and MIPs were obtained to evaluate the vascular anatomy. CONTRAST:  100 mL Isovue 370 intravenous COMPARISON:  CT 11/18/2018, chest x-ray 12/28/2018 FINDINGS: Cardiovascular: Satisfactory opacification of the pulmonary arteries to the segmental level. No evidence of pulmonary embolism. Nonaneurysmal aorta.  Moderate aortic atherosclerosis. Coronary vascular calcification. Mild cardiomegaly. No pericardial effusion. Mediastinum/Nodes: Midline trachea. No thyroid mass. Subcentimeter mediastinal lymph nodes. Mild circumferential wall thickening of the distal esophagus. Lungs/Pleura: New small bilateral pleural effusions. Diffuse bilateral septal thickening. Partial resolution of previously noted primarily peripheral ground-glass densities. No new ground-glass abnormality. Partial atelectasis at both lower lobes. Upper Abdomen: No acute abnormality. Musculoskeletal: No chest wall abnormality. No acute or significant osseous findings. Review of the MIP images confirms the above findings. IMPRESSION: 1. Negative for acute pulmonary embolus. 2. Cardiomegaly with interim development of small bilateral pleural effusions. Diffuse bilateral septal thickening suggesting mild pulmonary edema. 3. Improved appearance of previously noted peripheral ground-glass densities with mild residual density in the upper lobes. Partial atelectasis at the lower lobes. Aortic Atherosclerosis (ICD10-I70.0). Electronically Signed   By: Donavan Foil M.D.   On: 12/28/2018 21:29   Dg Chest Port 1 View  Result Date: 12/28/2018 CLINICAL DATA:  Acute shortness of breath EXAM: PORTABLE CHEST 1 VIEW COMPARISON:  11/20/2018 and prior radiographs FINDINGS: Cardiomegaly and mild pulmonary vascular congestion noted. Elevated RIGHT hemidiaphragm again identified. There is no evidence of focal airspace disease, pulmonary edema, suspicious pulmonary nodule/mass, pleural effusion, or pneumothorax. No acute bony abnormalities are identified. IMPRESSION: 1. Cardiomegaly with mild pulmonary vascular congestion. Electronically Signed   By: Margarette Canada M.D.   On: 12/28/2018 17:00        Scheduled Meds: . allopurinol  300 mg Oral Daily  . amLODipine  10 mg Oral Daily  . aspirin EC  81 mg Oral QHS  . dextromethorphan-guaiFENesin  1 tablet Oral BID  .  docusate sodium  100 mg Oral BID  . enoxaparin (LOVENOX) injection  40 mg Subcutaneous Q24H  . furosemide  20 mg Intravenous BID  . glipiZIDE  10 mg Oral BID AC  . insulin aspart  0-5 Units Subcutaneous QHS  . insulin aspart  0-9 Units Subcutaneous TID WC  . isosorbide mononitrate  30 mg Oral Daily  . loratadine  10 mg Oral Daily  . omega-3 acid ethyl esters  1 g Oral BID  . ramipril  10 mg Oral BID  . sodium chloride flush  3 mL Intravenous Once  . sodium chloride flush  3 mL Intravenous Q12H  . tamsulosin  0.4 mg Oral QHS   Continuous Infusions: . sodium chloride       LOS: 1 day        Hosie Poisson, MD [facility] Pager [phone]  If 7PM-7AM, please contact night-coverage [email or web address] Password [email or web address] 12/30/2018, 11:29 AM

## 2018-12-30 NOTE — Consult Note (Signed)
Cardiology Consultation:   Patient ID: Michael Cobb MRN: [id number]; DOB: 03-19-1934  Admit date: 12/28/2018 Date of Consult: 12/30/2018  Primary Care Provider: Sandi Mealy, MD Primary Cardiologist: [name], MD  Primary Electrophysiologist:  None    Patient Profile:   Michael Cobb is a 84 y.o. male with a hx of CAD who is being seen today for the evaluation of CHF at the request of Dr. Karleen Hampshire.  History of Present Illness:   Michael Cobb is an 84 yr old male whom I see in the outpatient setting. He has severe 3-vessel coronary artery disease which is not amenable to surgical revascularization and has a chronic troponin elevation.  He was hospitalized in Sept 2020 with atypical chest pain in the setting of acute COVID infection.  He presented to the [facility] ED on 10/23 with shortness of breath and pleuritic chest pain. He was found to be in CHF with BNP 477 and with a significant decline in Hgb, down to 9.1 from 12.1 on 11/20/18. Creatinine was 1.32.  Chest xray showed mild pulmonary vascular congestion.  CT angio chest presumably performed for elevated d-dimer of 1.71 showed no evidence for acute pulmonary embolism. There were small bilateral pleural effusions and mild pulmonary edema.  HS troponins were 199 and 195 (previously 518-603 in Sept 2020).  Echocardiogram showed EF 40-45% with severe hypokinesis of inferior and lateral walls and grade 1 diastolic dysfunction.  He was started on IV Lasix 20 mg bid by internal medicine.  He is feeling much better and has been able to walk to the bathroom without shortness of breath. He denies chest pain. Leg swelling has essentially resolved.  His only complaint is constipation.  Heart Pathway Score:     Past Medical History:  Diagnosis Date   BPH (benign prostatic hyperplasia)    Cancer (HCC)    Skin   Chronic renal insufficiency, stage 3 (moderate)    Hyperlipidemia    Hypertension    Non-ST elevated  myocardial infarction (non-STEMI) ([place])    Syncope    Type 2 diabetes mellitus ([place])     Past Surgical History:  Procedure Laterality Date   APPENDECTOMY     BACK SURGERY     LEFT HEART CATH AND CORONARY ANGIOGRAPHY N/A 06/27/2017   Procedure: LEFT HEART CATH AND CORONARY ANGIOGRAPHY;  Surgeon: Burnell Blanks, MD;  Location: [facility];  Service: Cardiovascular;  Laterality: N/A;   SURGERY OF LIP       Home Medications:  Prior to Admission medications   Medication Sig Start Date End Date Taking? Authorizing Provider  allopurinol (ZYLOPRIM) 300 MG tablet Take 300 mg by mouth daily.   Yes [provider]  amLODipine (NORVASC) 10 MG tablet Take 1 tablet (10 mg total) by mouth daily. 11/22/18 12/28/18 Yes Arrien, Jimmy Picket, MD  aspirin EC 81 MG tablet Take 81 mg by mouth at bedtime.    Yes [provider]  fluticasone (FLONASE) 50 MCG/ACT nasal spray Place 1 spray into both nostrils daily as needed for allergies or rhinitis.    Yes [provider]  furosemide (LASIX) 20 MG tablet Take 20 mg by mouth daily as needed for fluid.    Yes [provider]  glipiZIDE (GLUCOTROL) 10 MG tablet Take 10 mg by mouth 2 (two) times daily before a meal.  02/23/17  Yes [provider]  HYDROcodone-acetaminophen (NORCO/VICODIN) 5-325 MG tablet Take 1 tablet by mouth daily as needed for moderate pain.  Yes [provider]  metFORMIN (GLUCOPHAGE) 500 MG tablet Take 500 mg by mouth 2 (two) times daily with a meal.   Yes [provider]  Omega-3 Fatty Acids (FISH OIL PO) Take 1 tablet by mouth 2 (two) times daily.    Yes [provider]  ramipril (ALTACE) 10 MG capsule Take 10 mg by mouth 2 (two) times daily. 04/07/17  Yes [provider]  tamsulosin (FLOMAX) 0.4 MG CAPS capsule Take 0.4 mg by mouth at bedtime.    Yes [provider]  acetaminophen (TYLENOL) 500 MG tablet Take 500 mg by mouth every 6  (six) hours as needed for mild pain or moderate pain.    [provider]  cetirizine (ZYRTEC) 10 MG tablet Take 10 mg by mouth at bedtime.     [provider]  dextromethorphan-guaiFENesin (MUCINEX DM) 30-600 MG 12hr tablet Take 1 tablet by mouth 2 (two) times daily.    [provider]  docusate sodium (COLACE) 100 MG capsule Take 100 mg by mouth 2 (two) times daily.    [provider]  isosorbide mononitrate (IMDUR) 30 MG 24 hr tablet Take 1 tablet (30 mg total) by mouth daily. 11/21/18 12/21/18  Arrien, Jimmy Picket, MD    Inpatient Medications: Scheduled Meds:  allopurinol  300 mg Oral Daily   amLODipine  10 mg Oral Daily   aspirin EC  81 mg Oral QHS   dextromethorphan-guaiFENesin  1 tablet Oral BID   docusate sodium  100 mg Oral BID   enoxaparin (LOVENOX) injection  40 mg Subcutaneous Q24H   furosemide  20 mg Intravenous BID   glipiZIDE  10 mg Oral BID AC   insulin aspart  0-5 Units Subcutaneous QHS   insulin aspart  0-9 Units Subcutaneous TID WC   isosorbide mononitrate  30 mg Oral Daily   loratadine  10 mg Oral Daily   omega-3 acid ethyl esters  1 g Oral BID   ramipril  10 mg Oral BID   sodium chloride flush  3 mL Intravenous Once   sodium chloride flush  3 mL Intravenous Q12H   tamsulosin  0.4 mg Oral QHS   Continuous Infusions:  sodium chloride     PRN Meds: sodium chloride, acetaminophen **OR** acetaminophen, albuterol, fluticasone, HYDROcodone-acetaminophen, iohexol, sodium chloride flush  Allergies:    Allergies  Allergen Reactions   Morphine And Related Other (See Comments)    Hallucinate    Social History:   Social History   Socioeconomic History   Marital status: Divorced    Spouse name: Not on file   Number of children: Not on file   Years of education: Not on file   Highest education level: Not on file  Occupational History   Not on file  Social Needs   Financial resource strain: Not on  file   Food insecurity    Worry: Not on file    Inability: Not on file   Transportation needs    Medical: Not on file    Non-medical: Not on file  Tobacco Use   Smoking status: Never Smoker   Smokeless tobacco: Never Used  Substance and Sexual Activity   Alcohol use: Never    Frequency: Never   Drug use: Never   Sexual activity: Not on file  Lifestyle   Physical activity    Days per week: Not on file    Minutes per session: Not on file   Stress: Not on file  Relationships   Social  connections    Talks on phone: Not on file    Gets together: Not on file    Attends religious service: Not on file    Active member of club or organization: Not on file    Attends meetings of clubs or organizations: Not on file    Relationship status: Not on file   Intimate partner violence    Fear of current or ex partner: Not on file    Emotionally abused: Not on file    Physically abused: Not on file    Forced sexual activity: Not on file  Other Topics Concern   Not on file  Social History Narrative   Not on file    Family History:    Family History  Problem Relation Age of Onset   Hypertension Father    CVA Father      ROS:  Please see the history of present illness.   All other ROS reviewed and negative.     Physical Exam/Data:   Vitals:   12/29/18 1430 12/29/18 2043 12/30/18 0438 12/30/18 0900  BP: (!) 143/78 (!) 151/73 (!) 141/67 140/74  Pulse: 68 66 65   Resp: 17  16   Temp: 97.7 F (36.5 C) 98.7 F (37.1 C) 99 F (37.2 C)   TempSrc: Oral Oral Oral   SpO2: 98% 97% 96%   Weight:   97.7 kg   Height:        Intake/Output Summary (Last 24 hours) at 12/30/2018 1140 Last data filed at 12/30/2018 1012 Gross per 24 hour  Intake 1320 ml  Output 3555 ml  Net -2235 ml   Last 3 Weights 12/30/2018 12/29/2018 12/28/2018  Weight (lbs) 215 lb 6.4 oz 223 lb 11.2 oz 224 lb  Weight (kg) 97.705 kg 101.47 kg 101.606 kg     Body mass index is 30.91 kg/m.    General:  Well nourished, well developed, in no acute distress HEENT: normal Lymph: no adenopathy Neck: no JVD Endocrine:  No thryomegaly Cardiac:  normal S1, S2; RRR; no murmur  Lungs:  clear to auscultation bilaterally, no wheezing, rhonchi or rales  Abd: soft, nontender, no hepatomegaly  Ext: no edema Musculoskeletal:  No deformities, BUE and BLE strength normal and equal Skin: warm and dry  Neuro:  CNs 2-12 intact, no focal abnormalities noted Psych:  Normal affect   EKG:  The EKG was personally reviewed and demonstrates:  Reviewed above Telemetry:  Telemetry was personally reviewed and demonstrates:  Sinus rhythm, PVC's, 7 beats of NSVT  Relevant CV Studies:  Echocardiogram 12/29/18:   1. Left ventricular ejection fraction, by visual estimation, is 40 to 45%. The left ventricle has moderately decreased function. There is mildly increased left ventricular hypertrophy.  2. Inferior and lateral wall severe hypokinesis.  3. Left ventricular diastolic Doppler parameters are consistent with impaired relaxation pattern of LV diastolic filling.  4. Elevated left atrial and left ventricular end-diastolic pressures.  5. Global right ventricle has normal systolic function.The right ventricular size is normal. No increase in right ventricular wall thickness.  6. The aortic valve is tricuspid Aortic valve regurgitation was not visualized by color flow Doppler. Mild aortic valve sclerosis without stenosis.  7. Left atrial size was normal.  8. Right atrial size was normal.  9. The mitral valve is abnormal. Mild mitral valve regurgitation. 10. The tricuspid valve is grossly normal. Tricuspid valve regurgitation is trivial. 11. The pulmonic valve was grossly normal. Pulmonic valve regurgitation is not visualized by color flow  Doppler. 12. The inferior vena cava is normal in size with <50% respiratory variability, suggesting right atrial pressure of 8 mmHg.   Cardiac cath 06/27/17:   Mid  RCA lesion is 60% stenosed.  Prox RCA to Mid RCA lesion is 20% stenosed.  Dist RCA lesion is 30% stenosed.  Post Atrio lesion is 30% stenosed.  Mid LM to Ost LAD lesion is 50% stenosed.  Ost LAD to Prox LAD lesion is 20% stenosed.  Ost 1st Diag lesion is 99% stenosed.  Prox LAD lesion is 90% stenosed.  Prox LAD to Mid LAD lesion is 50% stenosed.  Mid LAD lesion is 80% stenosed.  Mid LAD to Dist LAD lesion is 90% stenosed.  Ost 1st Mrg to 1st Mrg lesion is 60% stenosed.  Prox Cx to Mid Cx lesion is 50% stenosed.  Ost 2nd Mrg to 2nd Mrg lesion is 99% stenosed.  Mid Cx to Dist Cx lesion is 99% stenosed.   1. Severe triple vessel CAD.  2. Heavily calcified LAD with severe stenosis extending from the proximal vessel into the distal vessel. Severe stenosis ostial small Diagonal branch.  3. Severe stenosis in the distal Circumflex at bifurcation with a small caliber obtuse marginal branch. The entire Circumflex is heavily calcified. The first OM branch has moderate disease.  4. Moderate stenosis mid RCA  Recommendations: He has severe CAD with heavily calcified vessels. The entire LAD is heavily calcified and diseased. His distal targets are not good for consideration of bypass. I would recommend medical management at this time. He is on an ASA, statin and Norvasc. May add Imdur and a beta blocker if he has chest pain. Fortunately, he has no chest pain. I will review films with the my interventional colleagues and then  Formulate a plan which I will discuss with Dr. Bronson Ing. He will be discharged home today after bedrest. His brothers funeral is tomorrow and he wishes to attend this.   Laboratory Data:  High Sensitivity Troponin:   Recent Labs  Lab 12/28/18 1611 12/28/18 1734 12/29/18 0545  TROPONINIHS 199* 195* 160*     Chemistry Recent Labs  Lab 12/28/18 1611 12/29/18 0545 12/30/18 0439  NA 140 141 138  K 4.0 3.9 3.5  CL 112* 108 103  CO2 20* 22 23  GLUCOSE  130* 172* 102*  BUN 17 18 16   CREATININE 1.32* 1.24 1.38*  CALCIUM 8.5* 8.8* 8.7*  GFRNONAA 50* 54* 47*  GFRAA 58* >60 55*  ANIONGAP 8 11 12     Recent Labs  Lab 12/28/18 1611 12/29/18 0545  PROT 6.3* 6.1*  ALBUMIN 3.2* 3.0*  AST 22 20  ALT 21 18  ALKPHOS 73 69  BILITOT 0.6 0.7   Hematology Recent Labs  Lab 12/28/18 1611 12/29/18 0545 12/30/18 0439  WBC 5.9 5.7 4.7  RBC 2.92*   2.89* 3.04* 2.93*  HGB 9.1* 9.6* 9.4*  HCT 29.9* 30.5* 28.0*  MCV 102.4* 100.3* 95.6  MCH 31.2 31.6 32.1  MCHC 30.4 31.5 33.6  RDW 16.1* 16.1* 15.4  PLT 203 196 199   BNP Recent Labs  Lab 12/28/18 1611  BNP 477.0*    DDimer  Recent Labs  Lab 12/28/18 1611  DDIMER 1.71*     Radiology/Studies:  Ct Angio Chest Pe W And/or Wo Contrast  Result Date: 12/28/2018 CLINICAL DATA:  Shortness of breath EXAM: CT ANGIOGRAPHY CHEST WITH CONTRAST TECHNIQUE: Multidetector CT imaging of the chest was performed using the standard protocol during bolus administration of intravenous contrast. Multiplanar  CT image reconstructions and MIPs were obtained to evaluate the vascular anatomy. CONTRAST:  100 mL Isovue 370 intravenous COMPARISON:  CT 11/18/2018, chest x-ray 12/28/2018 FINDINGS: Cardiovascular: Satisfactory opacification of the pulmonary arteries to the segmental level. No evidence of pulmonary embolism. Nonaneurysmal aorta. Moderate aortic atherosclerosis. Coronary vascular calcification. Mild cardiomegaly. No pericardial effusion. Mediastinum/Nodes: Midline trachea. No thyroid mass. Subcentimeter mediastinal lymph nodes. Mild circumferential wall thickening of the distal esophagus. Lungs/Pleura: New small bilateral pleural effusions. Diffuse bilateral septal thickening. Partial resolution of previously noted primarily peripheral ground-glass densities. No new ground-glass abnormality. Partial atelectasis at both lower lobes. Upper Abdomen: No acute abnormality. Musculoskeletal: No chest wall abnormality.  No acute or significant osseous findings. Review of the MIP images confirms the above findings. IMPRESSION: 1. Negative for acute pulmonary embolus. 2. Cardiomegaly with interim development of small bilateral pleural effusions. Diffuse bilateral septal thickening suggesting mild pulmonary edema. 3. Improved appearance of previously noted peripheral ground-glass densities with mild residual density in the upper lobes. Partial atelectasis at the lower lobes. Aortic Atherosclerosis (ICD10-I70.0). Electronically Signed   By: Donavan Foil M.D.   On: 12/28/2018 21:29   Dg Chest Port 1 View  Result Date: 12/28/2018 CLINICAL DATA:  Acute shortness of breath EXAM: PORTABLE CHEST 1 VIEW COMPARISON:  11/20/2018 and prior radiographs FINDINGS: Cardiomegaly and mild pulmonary vascular congestion noted. Elevated RIGHT hemidiaphragm again identified. There is no evidence of focal airspace disease, pulmonary edema, suspicious pulmonary nodule/mass, pleural effusion, or pneumothorax. No acute bony abnormalities are identified. IMPRESSION: 1. Cardiomegaly with mild pulmonary vascular congestion. Electronically Signed   By: Margarette Canada M.D.   On: 12/28/2018 17:00    Assessment and Plan:   1. Acute systolic CHF: LVEF has declined to 40-45% with severe inferior and lateral wall hypokinesis (previous EF 55-60% by echo at [facility] in January 2019). Likely due to obstructive coronary artery disease given regional variation.  Has had roughly 4.4 L output in last 48+ hours on IV Lasix 20 mg bid and he is currently asymptomatic. I will switch IV Lasix to oral 40 mg daily. Currently not on beta blockers as he had intermittent Wenckebach while hospitalized in Sept 2020 and h/o bradycardia. HR currently in 60 bpm range so I am not inclined to initiate beta blockers at present. Can continue ramipril and Imdur for now while monitoring renal function. Consider eventually discharging him on Lasix 40 mg daily if renal function permits.  Can take an extra 40 mg for weight gain of 3 lbs in 24 hrs. He will also need supplemental KCl at discharge.  2. Severe 3-vessel coronary artery disease: Currently on ASA and Imdur 30 mg. Statin intolerant. Currently not on beta blockers as he had intermittent Wenckebach while hospitalized in Sept 2020 and h/o bradycardia. HR currently in 60 bpm range so I am not inclined to initiate beta blockers at present. Given significant drop in Hgb, ASA can be stopped if it continues to decline.   3. Hypertension: BP mildly elevated. Continue to monitor given ongoing diuresis.  4. CKD stage III: Creatinine 1.38 today, 1.24 yesterday, up to 1.75 on 11/20/18. Continue to monitor given ongoing diuresis. I will switch IV Lasix to oral 40 mg daily.  5. NSVT: K low normal at 3.5. I will order replacement to keep K>4. Will check magnesium level. Asymptomatic.  6. Constipation: On Colace.    For questions or updates, please contact [place] Please consult [email or web address] for contact info under     Signed, [name], MD  12/30/2018 11:40 AM

## 2018-12-31 DIAGNOSIS — R0600 Dyspnea, unspecified: Secondary | ICD-10-CM | POA: Diagnosis not present

## 2018-12-31 DIAGNOSIS — E1122 Type 2 diabetes mellitus with diabetic chronic kidney disease: Secondary | ICD-10-CM | POA: Diagnosis not present

## 2018-12-31 DIAGNOSIS — I509 Heart failure, unspecified: Secondary | ICD-10-CM | POA: Diagnosis not present

## 2018-12-31 DIAGNOSIS — N183 Chronic kidney disease, stage 3 unspecified: Secondary | ICD-10-CM | POA: Diagnosis not present

## 2018-12-31 DIAGNOSIS — I5041 Acute combined systolic (congestive) and diastolic (congestive) heart failure: Secondary | ICD-10-CM

## 2018-12-31 LAB — GLUCOSE, CAPILLARY
Glucose-Capillary: 97 mg/dL (ref 70–99)
Glucose-Capillary: 186 mg/dL — ABNORMAL HIGH (ref 70–99)

## 2018-12-31 LAB — BASIC METABOLIC PANEL
Anion gap: 10 (ref 5–15)
BUN: 19 mg/dL (ref 8–23)
CO2: 24 mmol/L (ref 22–32)
Calcium: 8.7 mg/dL — ABNORMAL LOW (ref 8.9–10.3)
Chloride: 105 mmol/L (ref 98–111)
Creatinine, Ser: 1.43 mg/dL — ABNORMAL HIGH (ref 0.61–1.24)
GFR calc Af Amer: 52 mL/min — ABNORMAL LOW (ref >60)
GFR calc non Af Amer: 45 mL/min — ABNORMAL LOW (ref >60)
Glucose, Bld: 75 mg/dL (ref 70–99)
Potassium: 3.8 mmol/L (ref 3.5–5.1)
Sodium: 139 mmol/L (ref 135–145)

## 2018-12-31 LAB — MAGNESIUM: Magnesium: 1.9 mg/dL (ref 1.7–2.4)

## 2018-12-31 MED ORDER — FERROUS SULFATE 325 (65 FE) MG PO TABS: 325.0000 mg | ORAL_TABLET | Freq: Two times a day (BID) | ORAL | 3 refills | Status: AC

## 2018-12-31 MED ORDER — FUROSEMIDE 40 MG PO TABS: 40.0000 mg | ORAL_TABLET | Freq: Every day | ORAL | 2 refills | Status: AC

## 2018-12-31 MED ORDER — CYANOCOBALAMIN 1000 MCG PO TABS: 1000.0000 ug | ORAL_TABLET | Freq: Every day | ORAL | 1 refills | Status: AC

## 2018-12-31 MED ORDER — POTASSIUM CHLORIDE CRYS ER 20 MEQ PO TBCR: 20.0000 meq | EXTENDED_RELEASE_TABLET | Freq: Two times a day (BID) | ORAL | 1 refills | Status: DC

## 2018-12-31 NOTE — Discharge Summary (Signed)
Physician Discharge Summary  Michael Cobb [id number] DOB: 1935/01/30 DOA: 12/28/2018  PCP: Sandi Mealy, MD  Admit date: 12/28/2018 Discharge date: 12/31/2018  Admitted From: Home.  Disposition: Home.   Recommendations for Outpatient Follow-up:  1. Follow up with PCP in 1-2 weeks 2. Please obtain BMP/CBC in one week Please follow up with cardiology as recommended.    Discharge Condition:stable. CODE STATUS:full code.  Diet recommendation: Heart Healthy   Brief/Interim Summary: 84 year old male with prior h.o of hypertension, hyperlipidemia, DM, stage3 CKD, CAD, s/p NSTEMI IN 2019, h/o COVID pneumonia last month , presents to ED for worsening sob and leg edema and orthopnea.   He was transferred to [facility] for further evaluation.  EKG does not show any ischemic changes at this time.  His troponins were elevated in 190s.  Echocardiogram done showed left ventricular ejection fraction of 40 to 45% with severe hypokinesis of inferior lateral walls with grade 1 diastolic dysfunction.  He was started on IV Lasix 20 mg twice daily and he has diuresed appropriately Cardiology consulted and recommended to change to lasix 40 mg daily on discharge. With potassium supplementation.    Discharge Diagnoses:  Principal Problem:   Dyspnea Active Problems:   CAD (coronary artery disease)   DM2 (diabetes mellitus, type 2) (HCC)   COVID-19 virus infection   Acute CHF (congestive heart failure) (HCC)  Acute systolic and diastolic heart failure:  Admit to telemetry and started on IV lasix 20 mg BID later on transition to 40 mg daily by cardiology on discharge.  Echocardiogram showed LVEF of 40 to 45%, inferior and lateral wall severe hypokinesis and diastolic dysfunction.  He has diuresed more than 5 L since admission and continues to diurese. Elevated troponins probably from demand ischemia from chf.  EKG does not show ischemic changes.  Continue with strict intake and output.  Continue  with daily weights.  Cardiology consulted and recommendations given.   DM: With hyperglycemia CBG (last 3)  Recent Labs (last 2 labs)    CBG (last 3)  Recent Labs    12/30/18 1731 12/30/18 2101 12/31/18 0756  GLUCAP 161* 134* 97    Hemoglobin A1c 7.2.    Severe triple-vessel coronary artery disease Denies any chest pain at this time.  Cardiology recommends to continue with aspirin 81 mg.    Hypertension:  Well controlled.    Hyperlipidemia:  continue with lovaza.    COVID 19 Infection: Screening test is negative.  Improving.   Hypokalemia Replaced    Hypomagnesemia Replaced    Anemia of chronic disease/iron deficiency anemia/vitamin B12 deficiency anemia Hemoglobin stable around 9.4  Anemia panel shows low iron levels which will be replaced and low vitamin B12 levels which are being replaced.   Stage III CKD Baseline creatinine 1.7 Currently its at 1.4      Discharge Instructions  Discharge Instructions    Diet - low sodium heart healthy   Complete by: As directed    Discharge instructions   Complete by: As directed    Please follow up with cardiology as recommended.     Allergies as of 12/31/2018      Reactions   Morphine And Related Other (See Comments)   Hallucinate      Medication List    TAKE these medications   acetaminophen 500 MG tablet Commonly known as: TYLENOL Take 500 mg by mouth every 6 (six) hours as needed for mild pain or moderate pain.   allopurinol 300 MG tablet  Commonly known as: ZYLOPRIM Take 300 mg by mouth daily.   amLODipine 10 MG tablet Commonly known as: NORVASC Take 1 tablet (10 mg total) by mouth daily.   aspirin EC 81 MG tablet Take 81 mg by mouth at bedtime.   cetirizine 10 MG tablet Commonly known as: ZYRTEC Take 10 mg by mouth at bedtime.   cyanocobalamin 1000 MCG tablet Take 1 tablet (1,000 mcg total) by mouth daily. Start taking on: January 01, 2019    dextromethorphan-guaiFENesin 30-600 MG 12hr tablet Commonly known as: MUCINEX DM Take 1 tablet by mouth 2 (two) times daily.   docusate sodium 100 MG capsule Commonly known as: COLACE Take 100 mg by mouth 2 (two) times daily.   ferrous sulfate 325 (65 FE) MG tablet Take 1 tablet (325 mg total) by mouth 2 (two) times daily with a meal.   FISH OIL PO Take 1 tablet by mouth 2 (two) times daily.   fluticasone 50 MCG/ACT nasal spray Commonly known as: FLONASE Place 1 spray into both nostrils daily as needed for allergies or rhinitis.   furosemide 40 MG tablet Commonly known as: LASIX Take 1 tablet (40 mg total) by mouth daily. Start taking on: January 01, 2019 What changed:   medication strength  how much to take  when to take this  reasons to take this   glipiZIDE 10 MG tablet Commonly known as: GLUCOTROL Take 10 mg by mouth 2 (two) times daily before a meal.   HYDROcodone-acetaminophen 5-325 MG tablet Commonly known as: NORCO/VICODIN Take 1 tablet by mouth daily as needed for moderate pain.   isosorbide mononitrate 30 MG 24 hr tablet Commonly known as: IMDUR Take 1 tablet (30 mg total) by mouth daily.   metFORMIN 500 MG tablet Commonly known as: GLUCOPHAGE Take 500 mg by mouth 2 (two) times daily with a meal.   potassium chloride SA 20 MEQ tablet Commonly known as: KLOR-CON Take 1 tablet (20 mEq total) by mouth 2 (two) times daily.   ramipril 10 MG capsule Commonly known as: ALTACE Take 10 mg by mouth 2 (two) times daily.   tamsulosin 0.4 MG Caps capsule Commonly known as: FLOMAX Take 0.4 mg by mouth at bedtime.       Allergies  Allergen Reactions  . Morphine And Related Other (See Comments)    Hallucinate    Consultations:  cardiology   Procedures/Studies: Ct Angio Chest Pe W And/or Wo Contrast  Result Date: 12/28/2018 CLINICAL DATA:  Shortness of breath EXAM: CT ANGIOGRAPHY CHEST WITH CONTRAST TECHNIQUE: Multidetector CT imaging of the  chest was performed using the standard protocol during bolus administration of intravenous contrast. Multiplanar CT image reconstructions and MIPs were obtained to evaluate the vascular anatomy. CONTRAST:  100 mL Isovue 370 intravenous COMPARISON:  CT 11/18/2018, chest x-ray 12/28/2018 FINDINGS: Cardiovascular: Satisfactory opacification of the pulmonary arteries to the segmental level. No evidence of pulmonary embolism. Nonaneurysmal aorta. Moderate aortic atherosclerosis. Coronary vascular calcification. Mild cardiomegaly. No pericardial effusion. Mediastinum/Nodes: Midline trachea. No thyroid mass. Subcentimeter mediastinal lymph nodes. Mild circumferential wall thickening of the distal esophagus. Lungs/Pleura: New small bilateral pleural effusions. Diffuse bilateral septal thickening. Partial resolution of previously noted primarily peripheral ground-glass densities. No new ground-glass abnormality. Partial atelectasis at both lower lobes. Upper Abdomen: No acute abnormality. Musculoskeletal: No chest wall abnormality. No acute or significant osseous findings. Review of the MIP images confirms the above findings. IMPRESSION: 1. Negative for acute pulmonary embolus. 2. Cardiomegaly with interim development of small bilateral pleural effusions.  Diffuse bilateral septal thickening suggesting mild pulmonary edema. 3. Improved appearance of previously noted peripheral ground-glass densities with mild residual density in the upper lobes. Partial atelectasis at the lower lobes. Aortic Atherosclerosis (ICD10-I70.0). Electronically Signed   By: Donavan Foil M.D.   On: 12/28/2018 21:29   Dg Chest Port 1 View  Result Date: 12/28/2018 CLINICAL DATA:  Acute shortness of breath EXAM: PORTABLE CHEST 1 VIEW COMPARISON:  11/20/2018 and prior radiographs FINDINGS: Cardiomegaly and mild pulmonary vascular congestion noted. Elevated RIGHT hemidiaphragm again identified. There is no evidence of focal airspace disease,  pulmonary edema, suspicious pulmonary nodule/mass, pleural effusion, or pneumothorax. No acute bony abnormalities are identified. IMPRESSION: 1. Cardiomegaly with mild pulmonary vascular congestion. Electronically Signed   By: Margarette Canada M.D.   On: 12/28/2018 17:00       Subjective: No new complaints.   Discharge Exam: Vitals:   12/30/18 2103 12/31/18 0612  BP: 132/69 134/73  Pulse: 60 63  Resp:    Temp: 99.4 F (37.4 C) 99.2 F (37.3 C)  SpO2: 97% 96%   Vitals:   12/30/18 1508 12/30/18 2103 12/31/18 0612 12/31/18 0612  BP: 121/67 132/69  134/73  Pulse: 71 60  63  Resp: 16     Temp: 98.2 F (36.8 C) 99.4 F (37.4 C)  99.2 F (37.3 C)  TempSrc: Oral Oral  Oral  SpO2: 98% 97%  96%  Weight:   97 kg   Height:        General: Pt is alert, awake, not in acute distress Cardiovascular: RRR, S1/S2 +, no rubs, no gallops Respiratory: CTA bilaterally, no wheezing, no rhonchi Abdominal: Soft, NT, ND, bowel sounds + Extremities: pedal edema improving, no cyanosis    The results of significant diagnostics from this hospitalization (including imaging, microbiology, ancillary and laboratory) are listed below for reference.     Microbiology: Recent Results (from the past 240 hour(s))  SARS CORONAVIRUS 2 (TAT 6-24 HRS) Nasopharyngeal Nasopharyngeal Swab     Status: None   Collection Time: 12/28/18  4:43 PM   Specimen: Nasopharyngeal Swab  Result Value Ref Range Status   SARS Coronavirus 2 NEGATIVE NEGATIVE Final    Comment: (NOTE) SARS-CoV-2 target nucleic acids are NOT DETECTED. The SARS-CoV-2 RNA is generally detectable in upper and lower respiratory specimens during the acute phase of infection. Negative results do not preclude SARS-CoV-2 infection, do not rule out co-infections with other pathogens, and should not be used as the sole basis for treatment or other patient management decisions. Negative results must be combined with clinical observations, patient history,  and epidemiological information. The expected result is Negative. Fact Sheet for Patients: SugarRoll.be Fact Sheet for Healthcare Providers: https://www.woods-mathews.com/ This test is not yet approved or cleared by the [place] FDA and  has been authorized for detection and/or diagnosis of SARS-CoV-2 by FDA under an Emergency Use Authorization (EUA). This EUA will remain  in effect (meaning this test can be used) for the duration of the COVID-19 declaration under Section 56 4(b)(1) of the Act, 21 U.S.C. section 360bbb-3(b)(1), unless the authorization is terminated or revoked sooner. Performed at [facility], [place] [address], [place], [place] [address]      Labs: BNP (last 3 results) Recent Labs    12/28/18 1611  BNP [id number]*   Basic Metabolic Panel: Recent Labs  Lab 12/28/18 1611 12/29/18 0545 12/30/18 0439 12/31/18 0506  NA 140 141 138 139  K 4.0 3.9 3.5 3.8  CL 112* 108 103  105  CO2 20* 22 23 24   GLUCOSE 130* 172* 102* 75  BUN 17 18 16 19   CREATININE 1.32* 1.24 1.38* 1.43*  CALCIUM 8.5* 8.8* 8.7* 8.7*  MG  --   --  1.5* 1.9   Liver Function Tests: Recent Labs  Lab 12/28/18 1611 12/29/18 0545  AST 22 20  ALT 21 18  ALKPHOS 73 69  BILITOT 0.6 0.7  PROT 6.3* 6.1*  ALBUMIN 3.2* 3.0*   No results for input(s): LIPASE, AMYLASE in the last 168 hours. No results for input(s): AMMONIA in the last 168 hours. CBC: Recent Labs  Lab 12/28/18 1611 12/29/18 0545 12/30/18 0439  WBC 5.9 5.7 4.7  HGB 9.1* 9.6* 9.4*  HCT 29.9* 30.5* 28.0*  MCV 102.4* 100.3* 95.6  PLT 203 196 199   Cardiac Enzymes: No results for input(s): CKTOTAL, CKMB, CKMBINDEX, TROPONINI in the last 168 hours. BNP: Invalid input(s): POCBNP CBG: Recent Labs  Lab 12/30/18 1126 12/30/18 1619 12/30/18 1731 12/30/18 2101 12/31/18 0756  GLUCAP 201* 89 161* 134* 97   D-Dimer Recent Labs    12/28/18 1611  DDIMER 1.71*   Hgb  A1c Recent Labs    12/29/18 1833  HGBA1C 7.2*   Lipid Profile No results for input(s): CHOL, HDL, LDLCALC, TRIG, CHOLHDL, LDLDIRECT in the last 72 hours. Thyroid function studies No results for input(s): TSH, T4TOTAL, T3FREE, THYROIDAB in the last 72 hours.  Invalid input(s): FREET3 Anemia work up Recent Labs    12/28/18 1611  VITAMINB12 119*  FOLATE 9.1  FERRITIN 90  TIBC 270  IRON 34*  RETICCTPCT 2.5   Urinalysis No results found for: COLORURINE, APPEARANCEUR, LABSPEC, [place], GLUCOSEU, HGBUR, BILIRUBINUR, KETONESUR, PROTEINUR, UROBILINOGEN, NITRITE, LEUKOCYTESUR Sepsis Labs Invalid input(s): PROCALCITONIN,  WBC,  LACTICIDVEN Microbiology Recent Results (from the past 240 hour(s))  SARS CORONAVIRUS 2 (TAT 6-24 HRS) Nasopharyngeal Nasopharyngeal Swab     Status: None   Collection Time: 12/28/18  4:43 PM   Specimen: Nasopharyngeal Swab  Result Value Ref Range Status   SARS Coronavirus 2 NEGATIVE NEGATIVE Final    Comment: (NOTE) SARS-CoV-2 target nucleic acids are NOT DETECTED. The SARS-CoV-2 RNA is generally detectable in upper and lower respiratory specimens during the acute phase of infection. Negative results do not preclude SARS-CoV-2 infection, do not rule out co-infections with other pathogens, and should not be used as the sole basis for treatment or other patient management decisions. Negative results must be combined with clinical observations, patient history, and epidemiological information. The expected result is Negative. Fact Sheet for Patients: SugarRoll.be Fact Sheet for Healthcare Providers: https://www.woods-mathews.com/ This test is not yet approved or cleared by the [place] FDA and  has been authorized for detection and/or diagnosis of SARS-CoV-2 by FDA under an Emergency Use Authorization (EUA). This EUA will remain  in effect (meaning this test can be used) for the duration of the COVID-19  declaration under Section 56 4(b)(1) of the Act, 21 U.S.C. section 360bbb-3(b)(1), unless the authorization is terminated or revoked sooner. Performed at [facility], [place] [address] [place], [place] [address]      Time coordinating discharge:32 minutes  SIGNED:   Hosie Poisson, MD  [facility] 12/31/2018, 10:46 AM Pager   If 7PM-7AM, please contact night-coverage [email or web address] Password [email or web address]

## 2018-12-31 NOTE — Progress Notes (Signed)
Progress Note  Patient Name: Michael Cobb Date of Encounter: 12/31/2018  Primary Cardiologist:   [name], MD   Subjective   No pain.  Breathing better.    Inpatient Medications    Scheduled Meds: . allopurinol  300 mg Oral Daily  . amLODipine  10 mg Oral Daily  . aspirin EC  81 mg Oral QHS  . dextromethorphan-guaiFENesin  1 tablet Oral BID  . docusate sodium  100 mg Oral BID  . enoxaparin (LOVENOX) injection  40 mg Subcutaneous Q24H  . ferrous sulfate  325 mg Oral BID WC  . furosemide  40 mg Oral Daily  . glipiZIDE  10 mg Oral BID AC  . insulin aspart  0-5 Units Subcutaneous QHS  . insulin aspart  0-9 Units Subcutaneous TID WC  . isosorbide mononitrate  30 mg Oral Daily  . loratadine  10 mg Oral Daily  . omega-3 acid ethyl esters  1 g Oral BID  . potassium chloride  20 mEq Oral BID  . ramipril  10 mg Oral BID  . sodium chloride flush  3 mL Intravenous Once  . sodium chloride flush  3 mL Intravenous Q12H  . tamsulosin  0.4 mg Oral QHS  . vitamin B-12  1,000 mcg Oral Daily   Continuous Infusions: . sodium chloride     PRN Meds: sodium chloride, acetaminophen **OR** acetaminophen, albuterol, fluticasone, HYDROcodone-acetaminophen, iohexol, sodium chloride flush   Vital Signs    Vitals:   12/30/18 1508 12/30/18 2103 12/31/18 0612 12/31/18 0612  BP: 121/67 132/69  134/73  Pulse: 71 60  63  Resp: 16     Temp: 98.2 F (36.8 C) 99.4 F (37.4 C)  99.2 F (37.3 C)  TempSrc: Oral Oral  Oral  SpO2: 98% 97%  96%  Weight:   97 kg   Height:        Intake/Output Summary (Last 24 hours) at 12/31/2018 0728 Last data filed at 12/31/2018 [id number] Gross per 24 hour  Intake 720 ml  Output 1845 ml  Net -1125 ml   Filed Weights   12/29/18 0903 12/30/18 0438 12/31/18 0612  Weight: 101.5 kg 97.7 kg 97 kg    Telemetry    NSR, NSVT - Personally Reviewed  ECG    NA - Personally Reviewed  Physical Exam   GEN: No acute distress.   Neck: No  JVD  Cardiac: RRR, no murmurs, rubs, or gallops.  Respiratory: Clear  to auscultation bilaterally. GI: Soft, nontender, non-distended  MS: No  edema; No deformity. Neuro:  Nonfocal  Psych: Normal affect   Labs    Chemistry Recent Labs  Lab 12/28/18 1611 12/29/18 0545 12/30/18 0439  NA 140 141 138  K 4.0 3.9 3.5  CL 112* 108 103  CO2 20* 22 23  GLUCOSE 130* 172* 102*  BUN 17 18 16   CREATININE 1.32* 1.24 1.38*  CALCIUM 8.5* 8.8* 8.7*  PROT 6.3* 6.1*  --   ALBUMIN 3.2* 3.0*  --   AST 22 20  --   ALT 21 18  --   ALKPHOS 73 69  --   BILITOT 0.6 0.7  --   GFRNONAA 50* 54* 47*  GFRAA 58* >60 55*  ANIONGAP 8 11 12      Hematology Recent Labs  Lab 12/28/18 1611 12/29/18 0545 12/30/18 0439  WBC 5.9 5.7 4.7  RBC 2.92*  2.89* 3.04* 2.93*  HGB 9.1* 9.6* 9.4*  HCT 29.9* 30.5* 28.0*  MCV 102.4* 100.3* 95.6  MCH 31.2 31.6 32.1  MCHC 30.4 31.5 33.6  RDW 16.1* 16.1* 15.4  PLT 203 196 199    Cardiac EnzymesNo results for input(s): TROPONINI in the last 168 hours. No results for input(s): TROPIPOC in the last 168 hours.   BNP Recent Labs  Lab 12/28/18 1611  BNP 477.0*     DDimer  Recent Labs  Lab 12/28/18 1611  DDIMER 1.71*     Radiology    No results found.  Cardiac Studies   Echocardiogram 12/29/18:  1. Left ventricular ejection fraction, by visual estimation, is 40 to 45%. The left ventricle has moderately decreased function. There is mildly increased left ventricular hypertrophy. 2. Inferior and lateral wall severe hypokinesis. 3. Left ventricular diastolic Doppler parameters are consistent with impaired relaxation pattern of LV diastolic filling. 4. Elevated left atrial and left ventricular end-diastolic pressures. 5. Global right ventricle has normal systolic function.The right ventricular size is normal. No increase in right ventricular wall thickness. 6. The aortic valve is tricuspid Aortic valve regurgitation was not visualized by color flow  Doppler. Mild aortic valve sclerosis without stenosis. 7. Left atrial size was normal. 8. Right atrial size was normal. 9. The mitral valve is abnormal. Mild mitral valve regurgitation. 10. The tricuspid valve is grossly normal. Tricuspid valve regurgitation is trivial. 11. The pulmonic valve was grossly normal. Pulmonic valve regurgitation is not visualized by color flow Doppler. 12. The inferior vena cava is normal in size with <50% respiratory variability, suggesting right atrial pressure of 8 mmHg.  Patient Profile     84 y.o. male with a hx of CAD who is being seen for the evaluation of CHF at the request of Dr. Karleen Hampshire.   Assessment & Plan    Acute systolic CHF:   Net negative 5 liters this admit.    Agree with meds on [facility] for discharge.   We talked about salt and fluid restriction.     Hypertension: BP mildly elevated today.  Continue current meds.   CKD stage III: Creatinine 1.43 today.  Follow BMET at follow up appt.   NSVT:   NSVT.  No acute symptoms.    Constipation:   Discussed.  The treatment should not be excessive fluid intake.    For questions or updates, please contact [place] Please consult [email or web address] for contact info under Cardiology/STEMI.   Signed, Minus Breeding, MD  12/31/2018, 7:28 AM

## 2019-01-09 DIAGNOSIS — I472 Ventricular tachycardia: Secondary | ICD-10-CM | POA: Insufficient documentation

## 2019-01-09 DIAGNOSIS — I4729 Other ventricular tachycardia: Secondary | ICD-10-CM | POA: Insufficient documentation

## 2019-01-09 DIAGNOSIS — N183 Chronic kidney disease, stage 3 unspecified: Secondary | ICD-10-CM | POA: Insufficient documentation

## 2019-01-09 DIAGNOSIS — I5042 Chronic combined systolic (congestive) and diastolic (congestive) heart failure: Secondary | ICD-10-CM | POA: Insufficient documentation

## 2019-01-09 NOTE — Progress Notes (Signed)
Cardiology Office Note    Date:  01/15/2019   ID:  Michael, Cobb 22-Apr-1934, MRN [id number]  PCP:  Sandi Mealy, MD  Cardiologist: [name], MD EPS: None  Chief Complaint  Patient presents with  . Follow-up    History of Present Illness:  Michael Cobb is a 84 y.o. male with history of severe three-vessel CAD not amendable to surgery, chronic troponin elevation.    Atypical chest pain 11/2018 in the setting of Covid infection.  Discharge 12/31/2018 with CHF admission and pleuritic chest pain.  CT negative for pulmonary embolism.  Troponins 199 previously 500-600 in September 2020.  2D echo LVEF 40 to 45% with severe hypokinesis of the inferior and lateral walls grade 1 DD.  LV function previously had been 55 to 60% 03/2017.  Likely due to obstructive CAD given regional variation.  Patient diuresed 4.4 L on Lasix 20 mg IV twice daily.  He was switched to 40 mg daily.  No beta-blocker because of intermittent Wenkbach & bradycardia during admission 11/2018.  Patient also had a run NSVT potassium of 3.5 which was replaced.  CKD creatinine 1.43. discharge weight 97 kg/213 lbs.  Patient says he's doing well and breathing better. Says swelling isn't bad but up 9 lbs since hospital. Eating out most of the time.  Has a rash and has seen a dermatologist but no diagnosis made.  Also complains of constipation.  No further chest pain.  Overall weakness and fatigue.    Past Medical History:  Diagnosis Date  . BPH (benign prostatic hyperplasia)   . Cancer (HCC)    Skin  . Chronic renal insufficiency, stage 3 (moderate)   . Hyperlipidemia   . Hypertension   . Non-ST elevated myocardial infarction (non-STEMI) ([facility])   . Syncope   . Type 2 diabetes mellitus ([facility])     Past Surgical History:  Procedure Laterality Date  . APPENDECTOMY    . BACK SURGERY    . LEFT HEART CATH AND CORONARY ANGIOGRAPHY N/A 06/27/2017   Procedure: LEFT HEART CATH AND CORONARY ANGIOGRAPHY;   Surgeon: Burnell Blanks, MD;  Location: [facility];  Service: Cardiovascular;  Laterality: N/A;  . SURGERY OF LIP      Current Medications: Current Meds  Medication Sig  . acetaminophen (TYLENOL) 500 MG tablet Take 500 mg by mouth every 6 (six) hours as needed for mild pain or moderate pain.  Marland Kitchen allopurinol (ZYLOPRIM) 300 MG tablet Take 300 mg by mouth daily.  Marland Kitchen amLODipine (NORVASC) 10 MG tablet Take 1 tablet (10 mg total) by mouth daily.  Marland Kitchen aspirin EC 81 MG tablet Take 81 mg by mouth at bedtime.   . cetirizine (ZYRTEC) 10 MG tablet Take 10 mg by mouth at bedtime.   Marland Kitchen dextromethorphan-guaiFENesin (MUCINEX DM) 30-600 MG 12hr tablet Take 1 tablet by mouth 2 (two) times daily.  Marland Kitchen docusate sodium (COLACE) 100 MG capsule Take 100 mg by mouth 2 (two) times daily.  . ferrous sulfate 325 (65 FE) MG tablet Take 1 tablet (325 mg total) by mouth 2 (two) times daily with a meal.  . fluticasone (FLONASE) 50 MCG/ACT nasal spray Place 1 spray into both nostrils daily as needed for allergies or rhinitis.   . furosemide (LASIX) 40 MG tablet Take 1 tablet (40 mg total) by mouth daily.  Marland Kitchen glipiZIDE (GLUCOTROL) 10 MG tablet Take 10 mg by mouth 2 (two) times daily before a meal.   . HYDROcodone-acetaminophen (NORCO/VICODIN) 5-325 MG  tablet Take 1 tablet by mouth daily as needed for moderate pain.  . Omega-3 Fatty Acids (FISH OIL PO) Take 1 tablet by mouth 2 (two) times daily.   . potassium chloride SA (KLOR-CON) 20 MEQ tablet Take 1 tablet (20 mEq total) by mouth 2 (two) times daily.  . ramipril (ALTACE) 10 MG capsule Take 10 mg by mouth 2 (two) times daily.  . tamsulosin (FLOMAX) 0.4 MG CAPS capsule Take 0.4 mg by mouth at bedtime.   . vitamin B-12 1000 MCG tablet Take 1 tablet (1,000 mcg total) by mouth daily.     Allergies:   Morphine and related   Social History   Socioeconomic History  . Marital status: Divorced    Spouse name: Not on file  . Number of children: Not on file  . Years  of education: Not on file  . Highest education level: Not on file  Occupational History  . Not on file  Social Needs  . Financial resource strain: Not on file  . Food insecurity    Worry: Not on file    Inability: Not on file  . Transportation needs    Medical: Not on file    Non-medical: Not on file  Tobacco Use  . Smoking status: Never Smoker  . Smokeless tobacco: Never Used  Substance and Sexual Activity  . Alcohol use: Never    Frequency: Never  . Drug use: Never  . Sexual activity: Not on file  Lifestyle  . Physical activity    Days per week: Not on file    Minutes per session: Not on file  . Stress: Not on file  Relationships  . Social Herbalist on phone: Not on file    Gets together: Not on file    Attends religious service: Not on file    Active member of club or organization: Not on file    Attends meetings of clubs or organizations: Not on file    Relationship status: Not on file  Other Topics Concern  . Not on file  Social History Narrative  . Not on file     Family History:  The patient's   family history includes CVA in his father; Hypertension in his father.   ROS:   Please see the history of present illness.    ROS All other systems reviewed and are negative.   PHYSICAL EXAM:   VS:  BP (!) 142/67   Pulse 77   Temp 98.1 F (36.7 C)   Ht 5' 10" (1.778 m)   Wt 222 lb (100.7 kg)   SpO2 94%   BMI 31.85 kg/m   Physical Exam  GEN: Obese, in no acute distress  Neck: no JVD, carotid bruits, or masses Cardiac:RRR; no murmurs, rubs, or gallops  Respiratory:  clear to auscultation bilaterally, normal work of breathing GI: soft, nontender, nondistended, + BS Ext: +2 edema bilaterally MS: no deformity or atrophy  Skin: warm and dry, no rash Neuro:  Alert and Oriented x 3 Psych: euthymic mood, full affect  Wt Readings from Last 3 Encounters:  01/15/19 222 lb (100.7 kg)  12/31/18 213 lb 12.8 oz (97 kg)  11/20/18 210 lb 8.6 oz (95.5 kg)       Studies/Labs Reviewed:   EKG:  EKG is not ordered today.   Recent Labs: 12/28/2018: B Natriuretic Peptide 477.0 12/29/2018: ALT 18 12/30/2018: Hemoglobin 9.4; Platelets 199 12/31/2018: BUN 19; Creatinine, Ser 1.43; Magnesium 1.9; Potassium 3.8; Sodium 139  Lipid Panel No results found for: CHOL, TRIG, HDL, CHOLHDL, VLDL, LDLCALC, LDLDIRECT  Additional studies/ records that were reviewed today include:   Echocardiogram 12/29/18:    1. Left ventricular ejection fraction, by visual estimation, is 40 to 45%. The left ventricle has moderately decreased function. There is mildly increased left ventricular hypertrophy.  2. Inferior and lateral wall severe hypokinesis.  3. Left ventricular diastolic Doppler parameters are consistent with impaired relaxation pattern of LV diastolic filling.  4. Elevated left atrial and left ventricular end-diastolic pressures.  5. Global right ventricle has normal systolic function.The right ventricular size is normal. No increase in right ventricular wall thickness.  6. The aortic valve is tricuspid Aortic valve regurgitation was not visualized by color flow Doppler. Mild aortic valve sclerosis without stenosis.  7. Left atrial size was normal.  8. Right atrial size was normal.  9. The mitral valve is abnormal. Mild mitral valve regurgitation. 10. The tricuspid valve is grossly normal. Tricuspid valve regurgitation is trivial. 11. The pulmonic valve was grossly normal. Pulmonic valve regurgitation is not visualized by color flow Doppler. 12. The inferior vena cava is normal in size with <50% respiratory variability, suggesting right atrial pressure of 8 mmHg.     Cardiac cath 06/27/17:    Mid RCA lesion is 60% stenosed.  Prox RCA to Mid RCA lesion is 20% stenosed.  Dist RCA lesion is 30% stenosed.  Post Atrio lesion is 30% stenosed.  Mid LM to Ost LAD lesion is 50% stenosed.  Ost LAD to Prox LAD lesion is 20% stenosed.  Ost 1st Diag lesion  is 99% stenosed.  Prox LAD lesion is 90% stenosed.  Prox LAD to Mid LAD lesion is 50% stenosed.  Mid LAD lesion is 80% stenosed.  Mid LAD to Dist LAD lesion is 90% stenosed.  Ost 1st Mrg to 1st Mrg lesion is 60% stenosed.  Prox Cx to Mid Cx lesion is 50% stenosed.  Ost 2nd Mrg to 2nd Mrg lesion is 99% stenosed.  Mid Cx to Dist Cx lesion is 99% stenosed.   1. Severe triple vessel CAD.  2. Heavily calcified LAD with severe stenosis extending from the proximal vessel into the distal vessel. Severe stenosis ostial small Diagonal branch.  3. Severe stenosis in the distal Circumflex at bifurcation with a small caliber obtuse marginal branch. The entire Circumflex is heavily calcified. The first OM branch has moderate disease.  4. Moderate stenosis mid RCA   Recommendations: He has severe CAD with heavily calcified vessels. The entire LAD is heavily calcified and diseased. His distal targets are not good for consideration of bypass. I would recommend medical management at this time. He is on an ASA, statin and Norvasc. May add Imdur and a beta blocker if he has chest pain. Fortunately, he has no chest pain. I will review films with the my interventional colleagues and then  Formulate a plan which I will discuss with Dr. Bronson Ing. He will be discharged home today after bedrest. His brothers funeral is tomorrow and he wishes to attend this.      ASSESSMENT:    1. Coronary artery disease involving native coronary artery of native heart without angina pectoris   2. Chronic combined systolic and diastolic CHF (congestive heart failure) ([facility])   3. Benign essential HTN   4. Stage 3 chronic kidney disease, unspecified whether stage 3a or 3b CKD   5. NSVT (nonsustained ventricular tachycardia) (HCC)      PLAN:  In order of problems listed  above:  Acute on chronic systolic and diastolic CHF ejection fraction declined to 40 to 45% with severe inferior and lateral wall hypokinesis previously  55 to 60% on echo 03/2017.  Felt secondary to obstructive CAD.  Sent home on Lasix 40 mg once daily.  Patient's weight is up 9 pounds but breathing is better and lungs are clear.  He is eating out most of his meals.  Instructed him on 2 g sodium diet.  Increase Lasix to 80 mg for 2 days potassium 40 mEq twice a day for 2 days then back to regular doses.  Check be met and BNP today.  Follow-up with me in 3 weeks, Dr. Bronson Ing and 2 months.  CAD severe three-vessel not amendable to surgery and medical therapy recommended on aspirin and Imdur.  He is statin intolerant and not on beta-blockers because of bradycardia wenckebach 11/2018 no further angina Essential hypertension  CKD stage III creatinine 1.43 12/31/2018 recheck today  NSVT check potassium.  Patient asymptomatic.      Medication Adjustments/Labs and Tests Ordered: Current medicines are reviewed at length with the patient today.  Concerns regarding medicines are outlined above.  Medication changes, Labs and Tests ordered today are listed in the Patient Instructions below. There are no Patient Instructions on file for this visit.   Sumner Boast, PA-C  01/15/2019 12:59 PM    [facility] [address], [place], [place]  [address] Phone: [phone]; Fax: [phone]

## 2019-01-15 ENCOUNTER — Other Ambulatory Visit: Payer: Self-pay

## 2019-01-15 ENCOUNTER — Ambulatory Visit (INDEPENDENT_AMBULATORY_CARE_PROVIDER_SITE_OTHER): Payer: Medicare Other | Admitting: Physician Assistant

## 2019-01-15 ENCOUNTER — Telehealth: Payer: Self-pay | Admitting: *Deleted

## 2019-01-15 ENCOUNTER — Encounter: Payer: Self-pay | Admitting: Physician Assistant

## 2019-01-15 ENCOUNTER — Other Ambulatory Visit (HOSPITAL_COMMUNITY)
Admission: RE | Admit: 2019-01-15 | Discharge: 2019-01-15 | Disposition: A | Discharge status: Home / Self Care | Payer: Medicare Other | Source: Ambulatory Visit | Attending: Physician Assistant | Admitting: Physician Assistant

## 2019-01-15 VITALS — BP 142/67 | HR 77 | Temp 98.1°F | Ht 70.0 in | Wt 222.0 lb

## 2019-01-15 DIAGNOSIS — I2510 Atherosclerotic heart disease of native coronary artery without angina pectoris: Secondary | ICD-10-CM | POA: Diagnosis not present

## 2019-01-15 DIAGNOSIS — I5041 Acute combined systolic (congestive) and diastolic (congestive) heart failure: Secondary | ICD-10-CM

## 2019-01-15 DIAGNOSIS — I472 Ventricular tachycardia: Secondary | ICD-10-CM

## 2019-01-15 DIAGNOSIS — I10 Essential (primary) hypertension: Secondary | ICD-10-CM

## 2019-01-15 DIAGNOSIS — I5042 Chronic combined systolic (congestive) and diastolic (congestive) heart failure: Secondary | ICD-10-CM | POA: Diagnosis not present

## 2019-01-15 DIAGNOSIS — N183 Chronic kidney disease, stage 3 unspecified: Secondary | ICD-10-CM

## 2019-01-15 DIAGNOSIS — I4729 Other ventricular tachycardia: Secondary | ICD-10-CM

## 2019-01-15 LAB — BASIC METABOLIC PANEL
Anion gap: 8 (ref 5–15)
BUN: 28 mg/dL — ABNORMAL HIGH (ref 8–23)
CO2: 18 mmol/L — ABNORMAL LOW (ref 22–32)
Calcium: 8.8 mg/dL — ABNORMAL LOW (ref 8.9–10.3)
Chloride: 108 mmol/L (ref 98–111)
Creatinine, Ser: 1.72 mg/dL — ABNORMAL HIGH (ref 0.61–1.24)
GFR calc Af Amer: 42 mL/min — ABNORMAL LOW (ref >60)
GFR calc non Af Amer: 36 mL/min — ABNORMAL LOW (ref >60)
Glucose, Bld: 272 mg/dL — ABNORMAL HIGH (ref 70–99)
Potassium: 4.7 mmol/L (ref 3.5–5.1)
Sodium: 134 mmol/L — ABNORMAL LOW (ref 135–145)

## 2019-01-15 LAB — BRAIN NATRIURETIC PEPTIDE: B Natriuretic Peptide: 230 pg/mL — ABNORMAL HIGH (ref 0.0–100.0)

## 2019-01-15 NOTE — Telephone Encounter (Signed)
Pt.notified

## 2019-01-15 NOTE — Telephone Encounter (Signed)
-----   Message from Imogene Burn, [place] sent at 01/15/2019  3:13 PM EST ----- Heart failure marker up a little as expected. Kidney function up as well. Reiterate low sodium diet, continue with current plan. Needs bmet and bnp next week.

## 2019-01-15 NOTE — Telephone Encounter (Signed)
Called patient with test results. No answer. Left message to call back.  

## 2019-01-15 NOTE — Patient Instructions (Signed)
Medication Instructions:  Your physician has recommended you make the following change in your medication:  Take Lasix 80 mg Daily for 2 Days  Take Potassium 40 mEq for 2 Days   *If you need a refill on your cardiac medications before your next appointment, please call your pharmacy*  Lab Work: Your physician recommends that you return for lab work in: Today   If you have labs (blood work) drawn today and your tests are completely normal, you will receive your results only by:  [facility] (if you have MyChart) OR  A paper copy in the mail If you have any lab test that is abnormal or we need to change your treatment, we will call you to review the results.  Testing/Procedures: NONE   Follow-Up: At [facility], you and your health needs are our priority.  As part of our continuing mission to provide you with exceptional heart care, we have created designated Provider Care Teams.  These Care Teams include your primary Cardiologist (physician) and Advanced Practice Providers (APPs -  Physician Assistants and Nurse Practitioners) who all work together to provide you with the care you need, when you need it.  Your next appointment:   3 Weeks with Ermalinda Barrios 2 Month with Dr. Bronson Ing   The format for your next appointment:   In Person  Provider:   You may see [name], MD or one of the following Advanced Practice Providers on your designated Care Team:    Bernerd Pho, PA-C   Ermalinda Barrios, PA-C    Other Instructions Thank you for choosing [facility]!   Low-Sodium Eating Plan Sodium, which is an element that makes up salt, helps you maintain a healthy balance of fluids in your body. Too much sodium can increase your blood pressure and cause fluid and waste to be held in your body. Your health care provider or dietitian may recommend following this plan if you have high blood pressure (hypertension), kidney disease, liver disease, or heart  failure. Eating less sodium can help lower your blood pressure, reduce swelling, and protect your heart, liver, and kidneys. What are tips for following this plan? General guidelines  Most people on this plan should limit their sodium intake to 1,500-2,000 mg (milligrams) of sodium each day. Reading food labels   The Nutrition Facts label lists the amount of sodium in one serving of the food. If you eat more than one serving, you must multiply the listed amount of sodium by the number of servings.  Choose foods with less than 140 mg of sodium per serving.  Avoid foods with 300 mg of sodium or more per serving. Shopping  Look for lower-sodium products, often labeled as "low-sodium" or "no salt added."  Always check the sodium content even if foods are labeled as "unsalted" or "no salt added".  Buy fresh foods. ? Avoid canned foods and premade or frozen meals. ? Avoid canned, cured, or processed meats  Buy breads that have less than 80 mg of sodium per slice. Cooking  Eat more home-cooked food and less restaurant, buffet, and fast food.  Avoid adding salt when cooking. Use salt-free seasonings or herbs instead of table salt or sea salt. Check with your health care provider or pharmacist before using salt substitutes.  Cook with plant-based oils, such as canola, sunflower, or olive oil. Meal planning  When eating at a restaurant, ask that your food be prepared with less salt or no salt, if possible.  Avoid foods  that contain MSG (monosodium glutamate). MSG is sometimes added to [place] food, bouillon, and some canned foods. What foods are recommended? The items listed may not be a complete list. Talk with your dietitian about what dietary choices are best for you. Grains Low-sodium cereals, including oats, puffed wheat and rice, and shredded wheat. Low-sodium crackers. Unsalted rice. Unsalted pasta. Low-sodium bread. Whole-grain breads and whole-grain pasta. Vegetables Fresh or  frozen vegetables. "No salt added" canned vegetables. "No salt added" tomato sauce and paste. Low-sodium or reduced-sodium tomato and vegetable juice. Fruits Fresh, frozen, or canned fruit. Fruit juice. Meats and other protein foods Fresh or frozen (no salt added) meat, poultry, seafood, and fish. Low-sodium canned tuna and salmon. Unsalted nuts. Dried peas, beans, and lentils without added salt. Unsalted canned beans. Eggs. Unsalted nut butters. Dairy Milk. Soy milk. Cheese that is naturally low in sodium, such as ricotta cheese, fresh mozzarella, or Swiss cheese Low-sodium or reduced-sodium cheese. Cream cheese. Yogurt. Fats and oils Unsalted butter. Unsalted margarine with no trans fat. Vegetable oils such as canola or olive oils. Seasonings and other foods Fresh and dried herbs and spices. Salt-free seasonings. Low-sodium mustard and ketchup. Sodium-free salad dressing. Sodium-free light mayonnaise. Fresh or refrigerated horseradish. Lemon juice. Vinegar. Homemade, reduced-sodium, or low-sodium soups. Unsalted popcorn and pretzels. Low-salt or salt-free chips. What foods are not recommended? The items listed may not be a complete list. Talk with your dietitian about what dietary choices are best for you. Grains Instant hot cereals. Bread stuffing, pancake, and biscuit mixes. Croutons. Seasoned rice or pasta mixes. Noodle soup cups. Boxed or frozen macaroni and cheese. Regular salted crackers. Self-rising flour. Vegetables Sauerkraut, pickled vegetables, and relishes. Olives. [place] fries. Onion rings. Regular canned vegetables (not low-sodium or reduced-sodium). Regular canned tomato sauce and paste (not low-sodium or reduced-sodium). Regular tomato and vegetable juice (not low-sodium or reduced-sodium). Frozen vegetables in sauces. Meats and other protein foods Meat or fish that is salted, canned, smoked, spiced, or pickled. Bacon, ham, sausage, hotdogs, corned beef, chipped beef, packaged  lunch meats, salt pork, jerky, pickled herring, anchovies, regular canned tuna, sardines, salted nuts. Dairy Processed cheese and cheese spreads. Cheese curds. Blue cheese. Feta cheese. String cheese. Regular cottage cheese. Buttermilk. Canned milk. Fats and oils Salted butter. Regular margarine. Ghee. Bacon fat. Seasonings and other foods Onion salt, garlic salt, seasoned salt, table salt, and sea salt. Canned and packaged gravies. Worcestershire sauce. Tartar sauce. Barbecue sauce. Teriyaki sauce. Soy sauce, including reduced-sodium. Steak sauce. Fish sauce. Oyster sauce. Cocktail sauce. Horseradish that you find on the shelf. Regular ketchup and mustard. Meat flavorings and tenderizers. Bouillon cubes. Hot sauce and Tabasco sauce. Premade or packaged marinades. Premade or packaged taco seasonings. Relishes. Regular salad dressings. Salsa. Potato and tortilla chips. Corn chips and puffs. Salted popcorn and pretzels. Canned or dried soups. Pizza. Frozen entrees and pot pies. Summary  Eating less sodium can help lower your blood pressure, reduce swelling, and protect your heart, liver, and kidneys.  Most people on this plan should limit their sodium intake to 1,500-2,000 mg (milligrams) of sodium each day.  Canned, boxed, and frozen foods are high in sodium. Restaurant foods, fast foods, and pizza are also very high in sodium. You also get sodium by adding salt to food.  Try to cook at home, eat more fresh fruits and vegetables, and eat less fast food, canned, processed, or prepared foods. This information is not intended to replace advice given to you by your health care provider. Make sure  you discuss any questions you have with your health care provider. Document Released: 08/13/2001 Document Revised: 02/03/2017 Document Reviewed: 02/15/2016 Elsevier Patient Education  2020 [employer].

## 2019-02-04 ENCOUNTER — Ambulatory Visit (INDEPENDENT_AMBULATORY_CARE_PROVIDER_SITE_OTHER): Payer: Medicare Other | Admitting: Cardiology

## 2019-02-04 ENCOUNTER — Encounter: Payer: Self-pay | Admitting: Cardiology

## 2019-02-04 ENCOUNTER — Other Ambulatory Visit: Payer: Self-pay

## 2019-02-04 VITALS — BP 143/70 | HR 63 | Temp 95.9°F | Ht 70.0 in | Wt 222.0 lb

## 2019-02-04 DIAGNOSIS — N183 Chronic kidney disease, stage 3 unspecified: Secondary | ICD-10-CM

## 2019-02-04 DIAGNOSIS — I10 Essential (primary) hypertension: Secondary | ICD-10-CM | POA: Diagnosis not present

## 2019-02-04 DIAGNOSIS — I5042 Chronic combined systolic (congestive) and diastolic (congestive) heart failure: Secondary | ICD-10-CM

## 2019-02-04 DIAGNOSIS — I472 Ventricular tachycardia: Secondary | ICD-10-CM

## 2019-02-04 DIAGNOSIS — I4729 Other ventricular tachycardia: Secondary | ICD-10-CM

## 2019-02-04 DIAGNOSIS — I2510 Atherosclerotic heart disease of native coronary artery without angina pectoris: Secondary | ICD-10-CM | POA: Diagnosis not present

## 2019-02-04 NOTE — Progress Notes (Signed)
Cardiology Office Note   Date:  02/04/2019   ID:  Michael, Cobb 1935/01/26, MRN [id number]  PCP:  Sandi Mealy, MD  Cardiologist:  Dr. Bronson Ing    Chief Complaint  Patient presents with  . Coronary Artery Disease  . Congestive Heart Failure      History of Present Illness: Michael Cobb is a 84 y.o. male who presents for heart failure follow up  He has a history of severe three-vessel CAD not amendable to surgery, chronic troponin elevation.    Atypical chest pain 11/2018 in the setting of Covid infection.  Discharge 12/31/2018 with CHF admission and pleuritic chest pain.  CT negative for pulmonary embolism.  Troponins 199 previously 500-600 in September 2020.  2D echo LVEF 40 to 45% with severe hypokinesis of the inferior and lateral walls grade 1 DD.  LV function previously had been 55 to 60% 03/2017.  Likely due to obstructive CAD given regional variation.  Patient diuresed 4.4 L on Lasix 20 mg IV twice daily.  He was switched to 40 mg daily.  No beta-blocker because of intermittent [place] & bradycardia during admission 11/2018.  Patient also had a run NSVT potassium of 3.5 which was replaced.  CKD creatinine 1.43. discharge weight 97 kg/213 lbs.  On last visit lasix increased for 2 days then back to regular dose.  Needs labs.  Today he has no SOB, mild edema and no chest pain.  His complaint is back and hip pain since no longer on pain meds.  He has been unable to see Dr. Vertell Limber, pt will call his insurance company to see if they will pay.  In the meantime encouraged to call PCP.   He has taken an extra lasix if swelling increases.  At home his discharge wt was 213 and at home he is running 217 to 215, here with clothes his wt is greater.   Past Medical History:  Diagnosis Date  . BPH (benign prostatic hyperplasia)   . Cancer (HCC)    Skin  . Chronic renal insufficiency, stage 3 (moderate)   . Hyperlipidemia   . Hypertension   . Non-ST elevated  myocardial infarction (non-STEMI) ([facility])   . Syncope   . Type 2 diabetes mellitus ([place] [place])     Past Surgical History:  Procedure Laterality Date  . APPENDECTOMY    . BACK SURGERY    . LEFT HEART CATH AND CORONARY ANGIOGRAPHY N/A 06/27/2017   Procedure: LEFT HEART CATH AND CORONARY ANGIOGRAPHY;  Surgeon: Burnell Blanks, MD;  Location: [facility];  Service: Cardiovascular;  Laterality: N/A;  . SURGERY OF LIP       Current Outpatient Medications  Medication Sig Dispense Refill  . acetaminophen (TYLENOL) 500 MG tablet Take 500 mg by mouth every 6 (six) hours as needed for mild pain or moderate pain.    Marland Kitchen allopurinol (ZYLOPRIM) 300 MG tablet Take 300 mg by mouth daily.    Marland Kitchen amLODipine (NORVASC) 10 MG tablet Take 1 tablet (10 mg total) by mouth daily. 30 tablet 0  . aspirin EC 81 MG tablet Take 81 mg by mouth at bedtime.     . cetirizine (ZYRTEC) 10 MG tablet Take 10 mg by mouth at bedtime.     Marland Kitchen dextromethorphan-guaiFENesin (MUCINEX DM) 30-600 MG 12hr tablet Take 1 tablet by mouth 2 (two) times daily.    Marland Kitchen docusate sodium (COLACE) 100 MG capsule Take 100 mg by mouth 2 (two) times daily.    Marland Kitchen  ferrous sulfate 325 (65 FE) MG tablet Take 1 tablet (325 mg total) by mouth 2 (two) times daily with a meal. 30 tablet 3  . fluticasone (FLONASE) 50 MCG/ACT nasal spray Place 1 spray into both nostrils daily as needed for allergies or rhinitis.     . furosemide (LASIX) 40 MG tablet Take 1 tablet (40 mg total) by mouth daily. 30 tablet 2  . glipiZIDE (GLUCOTROL) 10 MG tablet Take 10 mg by mouth 2 (two) times daily before a meal.   0  . isosorbide mononitrate (IMDUR) 30 MG 24 hr tablet Take 1 tablet (30 mg total) by mouth daily. 30 tablet 0  . Omega-3 Fatty Acids (FISH OIL PO) Take 1 tablet by mouth 2 (two) times daily.     . potassium chloride SA (KLOR-CON) 20 MEQ tablet Take 1 tablet (20 mEq total) by mouth 2 (two) times daily. 30 tablet 1  . ramipril (ALTACE) 10 MG capsule Take 10 mg by  mouth 2 (two) times daily.  0  . tamsulosin (FLOMAX) 0.4 MG CAPS capsule Take 0.4 mg by mouth at bedtime.     . vitamin B-12 1000 MCG tablet Take 1 tablet (1,000 mcg total) by mouth daily. 30 tablet 1   No current facility-administered medications for this visit.     Allergies:   Morphine and related    Social History:  The patient  reports that he has never smoked. He has never used smokeless tobacco. He reports that he does not drink alcohol or use drugs.   Family History:  The patient's family history includes CVA in his father; Hypertension in his father.    ROS:  General:no colds or fevers, no weight changes Skin:no rashes or ulcers HEENT:no blurred vision, no congestion CV:see HPI PUL:see HPI GI:no diarrhea constipation or melena, no indigestion GU:no hematuria, no dysuria MS:no joint pain, no claudication Neuro:no syncope, no lightheadedness Endo:no diabetes, no thyroid disease  Wt Readings from Last 3 Encounters:  02/04/19 222 lb (100.7 kg)  01/15/19 222 lb (100.7 kg)  12/31/18 213 lb 12.8 oz (97 kg)     PHYSICAL EXAM: VS:  BP (!) 143/70   Pulse 63   Temp (!) 95.9 F (35.5 C)   Ht 5\' 10"  (1.778 m)   Wt 222 lb (100.7 kg)   SpO2 98%   BMI 31.85 kg/m  , BMI Body mass index is 31.85 kg/m. General:Pleasant affect, NAD Skin:Warm and dry, brisk capillary refill HEENT:normocephalic, sclera clear, mucus membranes moist Neck:supple, no JVD, no bruits  Heart:S1S2 RRR without murmur, gallup, rub or click Lungs:clear without rales, rhonchi, or wheezes [id number], non tender, + BS, do not palpate liver spleen or masses Ext:tr lower ext edema on Rt and none on Lt, 2+ pedal pulses, 2+ radial pulses Neuro:alert and oriented X 3, MAE, follows commands, + facial symmetry    EKG:  EKG is NOT ordered today.   Recent Labs: 12/29/2018: ALT 18 12/30/2018: Hemoglobin 9.4; Platelets 199 12/31/2018: Magnesium 1.9 01/15/2019: B Natriuretic Peptide 230.0; BUN 28; Creatinine,  Ser 1.72; Potassium 4.7; Sodium 134    Lipid Panel No results found for: CHOL, TRIG, HDL, CHOLHDL, VLDL, LDLCALC, LDLDIRECT     Other studies Reviewed: Additional studies/ records that were reviewed today include:. Echo 12/29/18 IMPRESSIONS    1. Left ventricular ejection fraction, by visual estimation, is 40 to 45%. The left ventricle has moderately decreased function. There is mildly increased left ventricular hypertrophy.  2. Inferior and lateral wall severe hypokinesis.  3.  Left ventricular diastolic Doppler parameters are consistent with impaired relaxation pattern of LV diastolic filling.  4. Elevated left atrial and left ventricular end-diastolic pressures.  5. Global right ventricle has normal systolic function.The right ventricular size is normal. No increase in right ventricular wall thickness.  6. The aortic valve is tricuspid Aortic valve regurgitation was not visualized by color flow Doppler. Mild aortic valve sclerosis without stenosis.  7. Left atrial size was normal.  8. Right atrial size was normal.  9. The mitral valve is abnormal. Mild mitral valve regurgitation. 10. The tricuspid valve is grossly normal. Tricuspid valve regurgitation is trivial. 11. The pulmonic valve was grossly normal. Pulmonic valve regurgitation is not visualized by color flow Doppler. 12. The inferior vena cava is normal in size with <50% respiratory variability, suggesting right atrial pressure of 8 mmHg.  FINDINGS  Left Ventricle: Left ventricular ejection fraction, by visual estimation, is 40 to 45%. The left ventricle has moderately decreased function. There is mildly increased left ventricular hypertrophy. Spectral Doppler shows Left ventricular diastolic  Doppler parameters are consistent with impaired relaxation pattern of LV diastolic filling. Elevated left atrial and left ventricular end-diastolic pressures.  Right Ventricle: The right ventricular size is normal. No increase in  right ventricular wall thickness. Global RV systolic function is has normal systolic function.  Left Atrium: Left atrial size was normal in size.  Right Atrium: Right atrial size was normal in size  Pericardium: There is no evidence of pericardial effusion.  Mitral Valve: The mitral valve is abnormal. There is mild thickening of the mitral valve leaflet(s). Mild mitral valve regurgitation.  Tricuspid Valve: The tricuspid valve is grossly normal. Tricuspid valve regurgitation is trivial by color flow Doppler.  Aortic Valve: The aortic valve is tricuspid. Aortic valve regurgitation was not visualized by color flow Doppler. Mild aortic valve sclerosis is present, with no evidence of aortic valve stenosis.  Pulmonic Valve: The pulmonic valve was grossly normal. Pulmonic valve regurgitation is not visualized by color flow Doppler.  Aorta: The aortic root and ascending aorta are structurally normal, with no evidence of dilitation.  Venous: The inferior vena cava is normal in size with less than 50% respiratory variability, suggesting right atrial pressure of 8 mmHg.  IAS/Shunts: No atrial level shunt detected by color flow Doppler.  cardiac cath 06/27/17  Mid RCA lesion is 60% stenosed.  Prox RCA to Mid RCA lesion is 20% stenosed.  Dist RCA lesion is 30% stenosed.  Post Atrio lesion is 30% stenosed.  Mid LM to Ost LAD lesion is 50% stenosed.  Ost LAD to Prox LAD lesion is 20% stenosed.  Ost 1st Diag lesion is 99% stenosed.  Prox LAD lesion is 90% stenosed.  Prox LAD to Mid LAD lesion is 50% stenosed.  Mid LAD lesion is 80% stenosed.  Mid LAD to Dist LAD lesion is 90% stenosed.  Ost 1st Mrg to 1st Mrg lesion is 60% stenosed.  Prox Cx to Mid Cx lesion is 50% stenosed.  Ost 2nd Mrg to 2nd Mrg lesion is 99% stenosed.  Mid Cx to Dist Cx lesion is 99% stenosed.   1. Severe triple vessel CAD.  2. Heavily calcified LAD with severe stenosis extending from the  proximal vessel into the distal vessel. Severe stenosis ostial small Diagonal branch.  3. Severe stenosis in the distal Circumflex at bifurcation with a small caliber obtuse marginal branch. The entire Circumflex is heavily calcified. The first OM branch has moderate disease.  4. Moderate stenosis mid RCA  Recommendations: He has severe CAD with heavily calcified vessels. The entire LAD is heavily calcified and diseased. His distal targets are not good for consideration of bypass. I would recommend medical management at this time. He is on an ASA, statin and Norvasc. May add Imdur and a beta blocker if he has chest pain. Fortunately, he has no chest pain. I will review films with the my interventional colleagues and then  Formulate a plan which I will discuss with Dr. Bronson Ing. He will be discharged home today after bedrest. His brothers funeral is tomorrow and he wishes to attend this.    ASSESSMENT AND PLAN:  1.  CAD of native CAD severe without angina, not a candidate for CABG.  Stable currently  2.  Chronic combined systolic and diastolic HF stable currently will check BMP and pro BNP - has not yet had done. EF 40-50% - pt will follow up in 2 months though we discussed he should call if increase of edema or SOB or chest pain.   3.  HTN elevated but he tells me this is his normal.   4.  CKD-3 will check BMP  5.  Hx NSVT no dizziness no syncope    Current medicines are reviewed with the patient today.  The patient Has no concerns regarding medicines.  The following changes have been made:  See above Labs/ tests ordered today include:see above  Disposition:   FU:  see above  Signed, Cecilie Kicks, NP  02/04/2019 2:17 PM    [facility] [place], [place], [place], [place] Phone: [phone]; Fax: [phone]

## 2019-02-04 NOTE — Patient Instructions (Addendum)
Medication Instructions:  Your physician recommends that you continue on your current medications as directed. Please refer to the Current Medication list given to you today.   Labwork: TODAY  Testing/Procedures: NONE  Follow-Up: Your physician recommends that you schedule a follow-up appointment in: [place] IN January    Any Other Special Instructions Will Be Listed Below (If Applicable).     If you need a refill on your cardiac medications before your next appointment, please call your pharmacy.

## 2019-02-05 ENCOUNTER — Other Ambulatory Visit (HOSPITAL_COMMUNITY)
Admission: RE | Admit: 2019-02-05 | Discharge: 2019-02-05 | Disposition: A | Discharge status: Home / Self Care | Payer: Medicare Other | Source: Ambulatory Visit | Attending: Physician Assistant | Admitting: Physician Assistant

## 2019-02-05 DIAGNOSIS — I5041 Acute combined systolic (congestive) and diastolic (congestive) heart failure: Secondary | ICD-10-CM | POA: Diagnosis present

## 2019-02-05 DIAGNOSIS — N183 Chronic kidney disease, stage 3 unspecified: Secondary | ICD-10-CM | POA: Diagnosis present

## 2019-02-05 LAB — BASIC METABOLIC PANEL
Anion gap: 6 (ref 5–15)
BUN: 31 mg/dL — ABNORMAL HIGH (ref 8–23)
CO2: 21 mmol/L — ABNORMAL LOW (ref 22–32)
Calcium: 9.1 mg/dL (ref 8.9–10.3)
Chloride: 111 mmol/L (ref 98–111)
Creatinine, Ser: 1.75 mg/dL — ABNORMAL HIGH (ref 0.61–1.24)
GFR calc Af Amer: 41 mL/min — ABNORMAL LOW (ref >60)
GFR calc non Af Amer: 35 mL/min — ABNORMAL LOW (ref >60)
Glucose, Bld: 241 mg/dL — ABNORMAL HIGH (ref 70–99)
Potassium: 5.5 mmol/L — ABNORMAL HIGH (ref 3.5–5.1)
Sodium: 138 mmol/L (ref 135–145)

## 2019-02-05 LAB — BRAIN NATRIURETIC PEPTIDE: B Natriuretic Peptide: 229 pg/mL — ABNORMAL HIGH (ref 0.0–100.0)

## 2019-02-06 ENCOUNTER — Other Ambulatory Visit: Payer: Self-pay | Admitting: Cardiovascular Disease

## 2019-02-06 MED ORDER — ISOSORBIDE MONONITRATE ER 30 MG PO TB24: 30.0000 mg | ORAL_TABLET | Freq: Every day | ORAL | 1 refills | Status: DC

## 2019-02-06 NOTE — Telephone Encounter (Signed)
Medication sent to pharmacy  

## 2019-02-06 NOTE — Telephone Encounter (Signed)
°*  STAT* If patient is at the pharmacy, call can be transferred to refill team.   1. Which medications need to be refilled?isosorbide mononitrate (IMDUR) 30 MG 24 hr tablet   2. Which pharmacy/location (including street and city if local pharmacy) is medication to be sent to? [facility]   3. Do they need a 30 day or 90 day supply?

## 2019-02-08 ENCOUNTER — Telehealth: Payer: Self-pay | Admitting: *Deleted

## 2019-02-08 DIAGNOSIS — Z79899 Other long term (current) drug therapy: Secondary | ICD-10-CM

## 2019-02-08 MED ORDER — POTASSIUM CHLORIDE CRYS ER 20 MEQ PO TBCR: 20.0000 meq | EXTENDED_RELEASE_TABLET | Freq: Every day | ORAL | 3 refills | Status: DC

## 2019-02-08 NOTE — Telephone Encounter (Signed)
Called patient with test results. No answer. Left message to call back.  

## 2019-02-08 NOTE — Telephone Encounter (Signed)
-----   Message from Isaiah Serge, NP sent at 02/06/2019 11:40 PM EST ----- Stop K+ for 2 days then resume once a day and recheck BMP on Friday stat.

## 2019-02-11 ENCOUNTER — Other Ambulatory Visit (HOSPITAL_COMMUNITY)
Admission: RE | Admit: 2019-02-11 | Discharge: 2019-02-11 | Disposition: A | Discharge status: Home / Self Care | Payer: Medicare Other | Source: Ambulatory Visit | Attending: Cardiology | Admitting: Cardiology

## 2019-02-11 ENCOUNTER — Other Ambulatory Visit: Payer: Self-pay

## 2019-02-11 DIAGNOSIS — Z79899 Other long term (current) drug therapy: Secondary | ICD-10-CM | POA: Insufficient documentation

## 2019-02-11 LAB — BASIC METABOLIC PANEL
Anion gap: 11 (ref 5–15)
BUN: 31 mg/dL — ABNORMAL HIGH (ref 8–23)
CO2: 19 mmol/L — ABNORMAL LOW (ref 22–32)
Calcium: 9.1 mg/dL (ref 8.9–10.3)
Chloride: 106 mmol/L (ref 98–111)
Creatinine, Ser: 1.73 mg/dL — ABNORMAL HIGH (ref 0.61–1.24)
GFR calc Af Amer: 42 mL/min — ABNORMAL LOW (ref >60)
GFR calc non Af Amer: 36 mL/min — ABNORMAL LOW (ref >60)
Glucose, Bld: 193 mg/dL — ABNORMAL HIGH (ref 70–99)
Potassium: 4.7 mmol/L (ref 3.5–5.1)
Sodium: 136 mmol/L (ref 135–145)

## 2019-02-15 ENCOUNTER — Telehealth: Payer: Self-pay | Admitting: *Deleted

## 2019-02-15 ENCOUNTER — Other Ambulatory Visit: Payer: Self-pay | Admitting: Student

## 2019-02-15 DIAGNOSIS — M5416 Radiculopathy, lumbar region: Secondary | ICD-10-CM

## 2019-02-15 NOTE — Telephone Encounter (Signed)
-----   Message from Reid Hope King, [place] sent at 02/14/2019  1:01 PM EST ----- Covering for laura. K back to normal. Renal function stable. No change.

## 2019-02-15 NOTE — Telephone Encounter (Signed)
Called pt re: lab results, left a message for pt to call back.  

## 2019-02-15 NOTE — Telephone Encounter (Signed)
Follow up   CMA unavailable Please return call to patient with results

## 2019-02-15 NOTE — Telephone Encounter (Signed)
New message  Patient is returning your call for lab results. Please call. 

## 2019-02-15 NOTE — Telephone Encounter (Signed)
Returned call to pt and he has been made aware of his lab results and to continue on current medications.

## 2019-02-15 NOTE — Telephone Encounter (Signed)
Follow up  ° ° °Pt is returning call  ° ° °Please call back  °

## 2019-02-18 ENCOUNTER — Telehealth: Payer: Self-pay | Admitting: *Deleted

## 2019-02-22 ENCOUNTER — Other Ambulatory Visit: Payer: Self-pay | Admitting: Cardiology

## 2019-02-25 ENCOUNTER — Other Ambulatory Visit: Payer: Self-pay | Admitting: Cardiovascular Disease

## 2019-02-25 MED ORDER — AMLODIPINE BESYLATE 10 MG PO TABS: 10.0000 mg | ORAL_TABLET | Freq: Every day | ORAL | 6 refills | Status: DC

## 2019-02-25 NOTE — Telephone Encounter (Signed)
*  STAT* If patient is at the pharmacy, call can be transferred to refill team.   1. Which medications need to be refilled? amLODipine (NORVASC) 10 MG tablet   2. Which pharmacy/location (including street and city if local pharmacy) is medication to be sent to? [facility]   3. Do they need a 30 day or 90 day supply?

## 2019-03-04 ENCOUNTER — Ambulatory Visit: Payer: Medicare Other

## 2019-03-07 ENCOUNTER — Ambulatory Visit (HOSPITAL_COMMUNITY): Payer: Medicare Other

## 2019-03-07 ENCOUNTER — Encounter (HOSPITAL_COMMUNITY): Payer: Self-pay

## 2019-03-18 ENCOUNTER — Other Ambulatory Visit: Payer: Self-pay

## 2019-03-18 DIAGNOSIS — N200 Calculus of kidney: Secondary | ICD-10-CM

## 2019-03-22 ENCOUNTER — Other Ambulatory Visit: Payer: Self-pay

## 2019-03-22 ENCOUNTER — Encounter: Payer: Self-pay | Admitting: Cardiovascular Disease

## 2019-03-22 ENCOUNTER — Ambulatory Visit: Payer: Medicare Other | Admitting: Cardiovascular Disease

## 2019-03-22 VITALS — BP 177/71 | HR 57 | Ht 70.0 in | Wt 234.8 lb

## 2019-03-22 DIAGNOSIS — N183 Chronic kidney disease, stage 3 unspecified: Secondary | ICD-10-CM | POA: Diagnosis not present

## 2019-03-22 DIAGNOSIS — E785 Hyperlipidemia, unspecified: Secondary | ICD-10-CM

## 2019-03-22 DIAGNOSIS — I472 Ventricular tachycardia: Secondary | ICD-10-CM | POA: Diagnosis not present

## 2019-03-22 DIAGNOSIS — I25118 Atherosclerotic heart disease of native coronary artery with other forms of angina pectoris: Secondary | ICD-10-CM | POA: Diagnosis not present

## 2019-03-22 DIAGNOSIS — I10 Essential (primary) hypertension: Secondary | ICD-10-CM

## 2019-03-22 DIAGNOSIS — I4729 Other ventricular tachycardia: Secondary | ICD-10-CM

## 2019-03-22 DIAGNOSIS — I5042 Chronic combined systolic (congestive) and diastolic (congestive) heart failure: Secondary | ICD-10-CM | POA: Diagnosis not present

## 2019-03-22 NOTE — Progress Notes (Signed)
SUBJECTIVE: The patient presents for routine follow-up. He has severe 3-vessel coronary artery disease which is not amenable to surgical revascularization and has a chronic troponin elevation.  He also has chronic systolic heart failure.  He denies chest pain, palpitations, leg swelling, and shortness of breath.  Primary complaints relate to right hip pain and left-sided neck pain.  He also has an area on the lower aspect of his left leg which caused some pain last night.    Review of Systems: As per "subjective", otherwise negative.  Allergies  Allergen Reactions  . Morphine And Related Other (See Comments)    Hallucinate    Current Outpatient Medications  Medication Sig Dispense Refill  . acetaminophen (TYLENOL) 500 MG tablet Take 500 mg by mouth every 6 (six) hours as needed for mild pain or moderate pain.    Marland Kitchen allopurinol (ZYLOPRIM) 300 MG tablet Take 300 mg by mouth daily.    Marland Kitchen amLODipine (NORVASC) 10 MG tablet Take 1 tablet (10 mg total) by mouth daily. 30 tablet 6  . aspirin EC 81 MG tablet Take 81 mg by mouth at bedtime.     . cetirizine (ZYRTEC) 10 MG tablet Take 10 mg by mouth at bedtime.     Marland Kitchen dextromethorphan-guaiFENesin (MUCINEX DM) 30-600 MG 12hr tablet Take 1 tablet by mouth 2 (two) times daily.    Marland Kitchen docusate sodium (COLACE) 100 MG capsule Take 100 mg by mouth 2 (two) times daily.    . ferrous sulfate 325 (65 FE) MG tablet Take 1 tablet (325 mg total) by mouth 2 (two) times daily with a meal. 30 tablet 3  . fluticasone (FLONASE) 50 MCG/ACT nasal spray Place 1 spray into both nostrils daily as needed for allergies or rhinitis.     . furosemide (LASIX) 40 MG tablet Take 1 tablet (40 mg total) by mouth daily. 30 tablet 2  . glipiZIDE (GLUCOTROL) 10 MG tablet Take 10 mg by mouth 2 (two) times daily before a meal.   0  . isosorbide mononitrate (IMDUR) 30 MG 24 hr tablet Take 1 tablet (30 mg total) by mouth daily. 90 tablet 1  . Omega-3 Fatty Acids (FISH OIL PO) Take  1 tablet by mouth 2 (two) times daily.     . potassium chloride SA (KLOR-CON) 20 MEQ tablet Take 1 tablet (20 mEq total) by mouth daily. 90 tablet 3  . ramipril (ALTACE) 10 MG capsule Take 10 mg by mouth 2 (two) times daily.  0  . tamsulosin (FLOMAX) 0.4 MG CAPS capsule Take 0.4 mg by mouth at bedtime.     . vitamin B-12 1000 MCG tablet Take 1 tablet (1,000 mcg total) by mouth daily. 30 tablet 1   No current facility-administered medications for this visit.    Past Medical History:  Diagnosis Date  . BPH (benign prostatic hyperplasia)   . Cancer (HCC)    Skin  . Chronic renal insufficiency, stage 3 (moderate)   . Hyperlipidemia   . Hypertension   . Non-ST elevated myocardial infarction (non-STEMI) ([facility])   . Syncope   . Type 2 diabetes mellitus ([place])     Past Surgical History:  Procedure Laterality Date  . APPENDECTOMY    . BACK SURGERY    . LEFT HEART CATH AND CORONARY ANGIOGRAPHY N/A 06/27/2017   Procedure: LEFT HEART CATH AND CORONARY ANGIOGRAPHY;  Surgeon: Burnell Blanks, MD;  Location: [facility];  Service: Cardiovascular;  Laterality: N/A;  . SURGERY OF LIP  Social History   Socioeconomic History  . Marital status: Divorced    Spouse name: Not on file  . Number of children: Not on file  . Years of education: Not on file  . Highest education level: Not on file  Occupational History  . Not on file  Tobacco Use  . Smoking status: Never Smoker  . Smokeless tobacco: Never Used  Substance and Sexual Activity  . Alcohol use: Never  . Drug use: Never  . Sexual activity: Not on file  Other Topics Concern  . Not on file  Social History Narrative  . Not on file   Social Determinants of Health   Financial Resource Strain:   . Difficulty of Paying Living Expenses: Not on file  Food Insecurity:   . Worried About Charity fundraiser in the Last Year: Not on file  . Ran Out of Food in the Last Year: Not on file  Transportation Needs:   . Lack of  Transportation (Medical): Not on file  . Lack of Transportation (Non-Medical): Not on file  Physical Activity:   . Days of Exercise per Week: Not on file  . Minutes of Exercise per Session: Not on file  Stress:   . Feeling of Stress : Not on file  Social Connections:   . Frequency of Communication with Friends and Family: Not on file  . Frequency of Social Gatherings with Friends and Family: Not on file  . Attends Religious Services: Not on file  . Active Member of Clubs or Organizations: Not on file  . Attends Archivist Meetings: Not on file  . Marital Status: Not on file  Intimate Partner Violence:   . Fear of Current or Ex-Partner: Not on file  . Emotionally Abused: Not on file  . Physically Abused: Not on file  . Sexually Abused: Not on file     Vitals:   03/22/19 0946  BP: (!) 177/71  Pulse: (!) 57  SpO2: 100%  Weight: 234 lb 12.8 oz (106.5 kg)  Height: 5\' 10"  (1.778 m)    Wt Readings from Last 3 Encounters:  03/22/19 234 lb 12.8 oz (106.5 kg)  02/04/19 222 lb (100.7 kg)  01/15/19 222 lb (100.7 kg)     PHYSICAL EXAM General: NAD HEENT: Normal. Neck: No JVD, no thyromegaly. Lungs: Clear to auscultation bilaterally with normal respiratory effort. CV: Regular rate and rhythm, normal S1/S2, no S3/S4, no murmur. No pretibial or periankle edema.    Abdomen: Soft, nontender, no distention.  Neurologic: Alert and oriented.  Psych: Normal affect. Skin: Normal. Musculoskeletal: No gross deformities.      Labs: Lab Results  Component Value Date/Time   K 4.7 02/11/2019 10:34 AM   BUN 31 (H) 02/11/2019 10:34 AM   CREATININE 1.73 (H) 02/11/2019 10:34 AM   ALT 18 12/29/2018 05:45 AM   HGB 9.4 (L) 12/30/2018 04:39 AM     Lipids: No results found for: LDLCALC, LDLDIRECT, CHOL, TRIG, HDL     Echo 12/29/18 IMPRESSIONS   1. Left ventricular ejection fraction, by visual estimation, is 40 to 45%. The left ventricle has moderately decreased  function. There is mildly increased left ventricular hypertrophy. 2. Inferior and lateral wall severe hypokinesis. 3. Left ventricular diastolic Doppler parameters are consistent with impaired relaxation pattern of LV diastolic filling. 4. Elevated left atrial and left ventricular end-diastolic pressures. 5. Global right ventricle has normal systolic function.The right ventricular size is normal. No increase in right ventricular wall thickness. 6. The  aortic valve is tricuspid Aortic valve regurgitation was not visualized by color flow Doppler. Mild aortic valve sclerosis without stenosis. 7. Left atrial size was normal. 8. Right atrial size was normal. 9. The mitral valve is abnormal. Mild mitral valve regurgitation. 10. The tricuspid valve is grossly normal. Tricuspid valve regurgitation is trivial. 11. The pulmonic valve was grossly normal. Pulmonic valve regurgitation is not visualized by color flow Doppler. 12. The inferior vena cava is normal in size with <50% respiratory variability, suggesting right atrial pressure of 8 mmHg.    ASSESSMENT AND PLAN:  1.  Coronary artery disease: Symptomatically stable.  He is not a candidate for CABG and medical management has been recommended.  Continue aspirin, Imdur, and amlodipine. He is statin intolerant and has tried multiple statins in the past which have all exacerbated his chronic joint pain.  2.  Hypertension: Markedly elevated today.  He says this is a spurious reading.  This will need further monitoring.  3.  Hyperlipidemia: He is statin intolerant and has tried multiple statins in the past which have all exacerbated his chronic joint pain.  Lipids on 09/12/2018 showed total cholesterol 283, triglycerides 209, HDL 35, LDL 206.  I will obtain a lipid panel.  4.  Chronic systolic heart failure: LVEF 40 to 45% by echocardiogram on 12/29/2018.  Continue Lasix 40 mg daily.  5.  Chronic kidney disease stage III: Creatinine 1.73 on  02/11/2019.   Disposition: Follow up 6 months virtual visit   [name], M.D., F.A.C.C.

## 2019-03-22 NOTE — Patient Instructions (Signed)
Your physician wants you to follow-up in: [place] will receive a reminder letter in the mail two months in advance. If you don't receive a letter, please call our office to schedule the follow-up appointment.  Your physician recommends that you continue on your current medications as directed. Please refer to the Current Medication list given to you today.  Your physician recommends that you return for lab work in: [place] FAST 6-8 HOURS PRIOR TO LAB WORK   Thank you for choosing [facility]!!

## 2019-03-25 ENCOUNTER — Other Ambulatory Visit (HOSPITAL_COMMUNITY)
Admission: RE | Admit: 2019-03-25 | Discharge: 2019-03-25 | Disposition: A | Discharge status: Home / Self Care | Payer: Medicare Other | Source: Ambulatory Visit | Attending: Cardiovascular Disease | Admitting: Cardiovascular Disease

## 2019-03-25 DIAGNOSIS — I2510 Atherosclerotic heart disease of native coronary artery without angina pectoris: Secondary | ICD-10-CM | POA: Diagnosis present

## 2019-03-25 LAB — LIPID PANEL
Cholesterol: 305 mg/dL — ABNORMAL HIGH (ref 0–200)
HDL: 37 mg/dL — ABNORMAL LOW (ref >40)
LDL Cholesterol: 227 mg/dL — ABNORMAL HIGH (ref 0–99)
Total CHOL/HDL Ratio: 8.2 RATIO
Triglycerides: 205 mg/dL — ABNORMAL HIGH (ref <150)
VLDL: 41 mg/dL — ABNORMAL HIGH (ref 0–40)

## 2019-03-26 ENCOUNTER — Telehealth: Payer: Self-pay | Admitting: *Deleted

## 2019-03-26 MED ORDER — REPATHA SURECLICK 140 MG/ML ~~LOC~~ SOAJ: 140.0000 mg | SUBCUTANEOUS | 3 refills | Status: DC

## 2019-03-26 NOTE — Telephone Encounter (Signed)
Laurine Blazer, LPN  [id number] [id number] AM EST    Patient notified and verbalized understanding. Copy to pmd. Stated that he can not get to [facility] for the [facility], but is willing to try this medication. Will send prescription in to local pharmacy ([facility]) & go from there. Stated that he does have someone at home that can help him with the injection.    Laurine Blazer, [place]  [id number] [id number] AM EST    Left message to return call.   Herminio Commons, MD  03/25/2019 11:21 AM EST    Severely elevated cholesterol and LDL. He is statin intolerant and needs Repatha. Please make referral to lipid clinic.

## 2019-03-27 ENCOUNTER — Telehealth: Payer: Self-pay | Admitting: *Deleted

## 2019-03-27 NOTE — Telephone Encounter (Signed)
Notified that Repatha [employer] approved from [id number]

## 2019-03-29 ENCOUNTER — Ambulatory Visit: Payer: Medicare Other | Admitting: Cardiovascular Disease

## 2019-04-01 ENCOUNTER — Telehealth: Payer: Self-pay | Admitting: Cardiovascular Disease

## 2019-04-01 NOTE — Telephone Encounter (Signed)
I have not heard about a reaction like that before with Repatha. For now I would recommend he continue to monitor symptoms but if they recur, he may need to see PCP where vitals can be checked and alternative causes can be ruled out.

## 2019-04-01 NOTE — Telephone Encounter (Signed)
Patient notified and verbalized understanding.  Stated that he got some new batteries for his bp monitor & BP was 131/60 with HR 59.  Does mention that his sugar is up more than usual though at 294.  Admits to eating more sweets lately.  He will continue to monitor BP & discuss sugar with pcp.

## 2019-04-01 NOTE — Telephone Encounter (Signed)
Patient states that he took a Cholesterol injection on 03/31/2019. States that his bed flipped with him. He became very weak. States that he is still fatigue.

## 2019-04-01 NOTE — Telephone Encounter (Signed)
Did first Repatha injection yesterday evening on 03/31/2019.  This morning - Laying on right side, went to turn to left side to get up out of bed, but had feeling like bed flipped.  Stated he felt that dizzy or lightheaded feeling with movement.  Now still noticing slight lightheadedness.  Stated that he did take Lexapro 10mg  last pm before going to bed - thought that it may help him rest.  Did rest pretty good last evening, but had that flipping feeling with movement trying to get out of bed this morning.  No other chest pain or SOB.  Does notice some weakness this morning since that episode.  No other new medications.  Stated that he had never had that feeling before now.  Unable to check BP at home, but states it typically has been doing good.

## 2019-04-03 ENCOUNTER — Other Ambulatory Visit: Payer: Self-pay

## 2019-04-03 ENCOUNTER — Ambulatory Visit (HOSPITAL_COMMUNITY)
Admission: RE | Admit: 2019-04-03 | Discharge: 2019-04-03 | Disposition: A | Discharge status: Home / Self Care | Payer: Medicare Other | Source: Ambulatory Visit | Attending: Student | Admitting: Student

## 2019-04-03 DIAGNOSIS — M5416 Radiculopathy, lumbar region: Secondary | ICD-10-CM | POA: Diagnosis not present

## 2019-04-09 ENCOUNTER — Encounter: Payer: Self-pay | Admitting: *Deleted

## 2019-04-23 ENCOUNTER — Telehealth: Payer: Self-pay | Admitting: *Deleted

## 2019-04-23 NOTE — Telephone Encounter (Signed)
Attempted to call patient to complete New Patient Questionnaire. Message left.

## 2019-04-24 ENCOUNTER — Other Ambulatory Visit: Payer: Self-pay

## 2019-04-24 ENCOUNTER — Encounter: Payer: Self-pay | Admitting: Student in an Organized Health Care Education/Training Program

## 2019-04-24 ENCOUNTER — Ambulatory Visit
Payer: Medicare Other | Attending: Student in an Organized Health Care Education/Training Program | Admitting: Student in an Organized Health Care Education/Training Program

## 2019-04-24 VITALS — BP 150/73 | HR 59 | Temp 97.4°F | Ht 71.0 in | Wt 232.0 lb

## 2019-04-24 DIAGNOSIS — M5416 Radiculopathy, lumbar region: Secondary | ICD-10-CM | POA: Diagnosis not present

## 2019-04-24 DIAGNOSIS — M5412 Radiculopathy, cervical region: Secondary | ICD-10-CM | POA: Diagnosis present

## 2019-04-24 DIAGNOSIS — G894 Chronic pain syndrome: Secondary | ICD-10-CM | POA: Insufficient documentation

## 2019-04-24 DIAGNOSIS — M48062 Spinal stenosis, lumbar region with neurogenic claudication: Secondary | ICD-10-CM | POA: Insufficient documentation

## 2019-04-24 DIAGNOSIS — G8929 Other chronic pain: Secondary | ICD-10-CM | POA: Diagnosis present

## 2019-04-24 DIAGNOSIS — M47812 Spondylosis without myelopathy or radiculopathy, cervical region: Secondary | ICD-10-CM | POA: Diagnosis present

## 2019-04-24 DIAGNOSIS — M25512 Pain in left shoulder: Secondary | ICD-10-CM | POA: Insufficient documentation

## 2019-04-24 DIAGNOSIS — M47816 Spondylosis without myelopathy or radiculopathy, lumbar region: Secondary | ICD-10-CM | POA: Diagnosis not present

## 2019-04-24 DIAGNOSIS — Z981 Arthrodesis status: Secondary | ICD-10-CM | POA: Insufficient documentation

## 2019-04-24 MED ORDER — GABAPENTIN 100 MG PO CAPS: ORAL_CAPSULE | ORAL | 0 refills | Status: DC

## 2019-04-24 NOTE — Progress Notes (Signed)
Patient: Michael Cobb  Service Category: E/M  Provider: Gillis Santa, MD  DOB: 09/25/1934  DOS: 04/24/2019  Referring Provider: Sandi Mealy, MD  MRN: [id number]  Setting: Ambulatory outpatient  PCP: Sandi Mealy, MD  Type: New Patient  Specialty: Interventional Pain Management    Location: Office  Delivery: Face-to-face     Primary Reason(s) for Visit: Encounter for initial evaluation of one or more chronic problems (new to examiner) potentially causing chronic pain, and posing a threat to normal musculoskeletal function. (Level of risk: High) CC: Neck Pain (right hip)  HPI  [name] is a 85 y.o. year old, male patient, who comes today to see [name] for the first time for an initial evaluation of his chronic pain. He has Abnormal stress test; CAD (coronary artery disease); History of non-ST elevation myocardial infarction (NSTEMI); Leg pain, bilateral; Generalized headaches; DDD (degenerative disc disease), cervical; DM2 (diabetes mellitus, type 2) ([place]); Benign essential HTN; Sepsis ([place]); NSTEMI (non-ST elevated myocardial infarction) ([place]); Chest pain; COVID-19 virus infection; Dyspnea; Acute CHF (congestive heart failure) ([place]); Acute combined systolic and diastolic HF (heart failure) ([place]); Chronic combined systolic and diastolic CHF (congestive heart failure) ([place]); CKD (chronic kidney disease), stage III; NSVT (nonsustained ventricular tachycardia) ([place]); Chronic pain syndrome; Lumbar radiculopathy; Lumbar facet arthropathy; Spinal stenosis, lumbar region, with neurogenic claudication; History of lumbar fusion; Cervical radicular pain; Cervical facet joint syndrome; and S/P cervical spinal fusion on their problem list. Today he comes in for evaluation of his Neck Pain (right hip)  Pain Assessment: Location: Right Neck(right hip) Radiating: Denies Onset: More than a month ago Duration: Chronic pain Quality: Aching, Burning, Numbness Severity: 5 /10 (subjective, self-reported pain  score)  Note: Reported level is compatible with observation.                         When using our objective Pain Scale, levels between 6 and 10/10 are said to belong in an emergency room, as it progressively worsens from a 6/10, described as severely limiting, requiring emergency care not usually available at an outpatient pain management facility. At a 6/10 level, communication becomes difficult and requires great effort. Assistance to reach the emergency department may be required. Facial flushing and profuse sweating along with potentially dangerous increases in heart rate and blood pressure will be evident. Effect on ADL: limits daily activities Timing: Constant Modifying factors: Meds BP: (!) 150/73  HR: (!) 59  Onset and Duration: Present longer than 3 months Cause of pain: Unknown Severity: NAS-11 on the average: 5/10 Timing: Night and After a period of immobility Aggravating Factors: Prolonged sitting and Prolonged standing Alleviating Factors: Medications Associated Problems: Numbness Quality of Pain: Annoying, Constant, Nagging, Numb and Uncomfortable Previous Examinations or Tests: MRI scan and X-rays Previous Treatments: Epidural steroid injections and Narcotic medications  The patient comes into the clinics today for the first time for a chronic pain management evaluation [name] states his neck pain is constant with radiation into the top of his shoulder. His past medical history includes a cervical fusion with bone harvested from his right hip, which is now causing him right hip pain. The right hip pain radiates into his thigh. He currently walks with a cane to aid in stability.  He has a recent history of MI December 2020, and was found unresponsive on the floor by his live-in caretaker. He states his pain has become more intolerable since this event. He rates his pain as 5/10 daily,  that keeps his from sleeping at night. The pain has begun to affect his daily self cares, as he  finds it harder to get ready in the morning because of the pain. He has taken low-dose Norco for over 20 years, primarily at bedtime to aid in night pain relief. He reports that he has not taken any Norco since October 2020. He has not tried topical analgesics, and could not withstand PT. He has tried Gabapentin in the past, and experienced unsteady gait and ataxia with dosage (374m capsule), and was discontinued. He is taking Tylenol, (dosage unknown) which takes the edge off, but the pain persists.    Historic Controlled Substance Pharmacotherapy Review  PMP and historical list of controlled substances:  12/31/2018  2   12/26/2018  Hydrocodone-Acetamin 5-325 MG  60.00  [address]   [id number]  [place] ([id number])   0  20.00 MME  Medicare   [place]     Pharmacodynamics: Desired effects: Analgesia: The patient reports >50% benefit. Reported improvement in function: The patient reports medication allows him to accomplish basic ADLs. Clinically meaningful improvement in function (CMIF): Sustained CMIF goals met Perceived effectiveness: Described as relatively effective, allowing for increase in activities of daily living (ADL) Undesirable effects: Side-effects or Adverse reactions: None reported Historical Monitoring: The patient  reports no history of drug use. List of all UDS Test(s): No results found for: MDMA, COCAINSCRNUR, [place] CANNABQUANT, T[place] EThayerList of other Serum/Urine Drug Screening Test(s):  No results found for: AMPHSCRSER, BARBSCRSER, BENZOSCRSER, COCAINSCRSER, COCAINSCRNUR, PCPSCRSER, PCPQUANT, THCSCRSER, THCU, CANNABQUANT, OPIATESCRSER, OXYSCRSER, PROPOXSCRSER, ETH Historical Background Evaluation: [place] PMP: PDMP not reviewed this encounter. Six (6) year initial data search conducted.             [place] Department of public safety, offender search: (Editor, commissioningInformation) Non-contributory Risk Assessment Profile: Aberrant behavior: None observed or detected today Risk factors for  fatal opioid overdose: None identified today Fatal overdose hazard ratio (HR): Calculation deferred Non-fatal overdose hazard ratio (HR): Calculation deferred Risk of opioid abuse or dependence: 0.7-3.0% with doses ? 36 MME/day and 6.1-26% with doses ? 120 MME/day. Substance use disorder (SUD) risk level: See below  Pharmacologic Plan: As per protocol, I have not taken over any controlled substance management, pending the results of ordered tests and/or consults.            Initial impression: Pending review of available data and ordered tests.  Meds   Current Outpatient Medications:  .  acetaminophen (TYLENOL) 500 MG tablet, Take 500 mg by mouth every 6 (six) hours as needed for mild pain or moderate pain., Disp: , Rfl:  .  allopurinol (ZYLOPRIM) 300 MG tablet, Take 300 mg by mouth daily., Disp: , Rfl:  .  aspirin EC 81 MG tablet, Take 81 mg by mouth at bedtime. , Disp: , Rfl:  .  cetirizine (ZYRTEC) 10 MG tablet, Take 10 mg by mouth at bedtime. , Disp: , Rfl:  .  dextromethorphan-guaiFENesin (MUCINEX DM) 30-600 MG 12hr tablet, Take 1 tablet by mouth 2 (two) times daily., Disp: , Rfl:  .  docusate sodium (COLACE) 100 MG capsule, Take 100 mg by mouth 2 (two) times daily., Disp: , Rfl:  .  Evolocumab (REPATHA SURECLICK) 1147MG/ML SOAJ, Inject 140 mg into the skin every 14 (fourteen) days., Disp: 6 pen, Rfl: 3 .  ferrous sulfate 325 (65 FE) MG tablet, Take 1 tablet (325 mg total) by mouth 2 (two) times daily with a meal., Disp: 30  tablet, Rfl: 3 .  fluticasone (FLONASE) 50 MCG/ACT nasal spray, Place 1 spray into both nostrils daily as needed for allergies or rhinitis. , Disp: , Rfl:  .  furosemide (LASIX) 40 MG tablet, Take 1 tablet (40 mg total) by mouth daily., Disp: 30 tablet, Rfl: 2 .  glipiZIDE (GLUCOTROL) 10 MG tablet, Take 10 mg by mouth 2 (two) times daily before a meal. , Disp: , Rfl: 0 .  meclizine (ANTIVERT) 25 MG tablet, Take 25 mg by mouth 3 (three) times daily as needed for  dizziness., Disp: , Rfl:  .  Omega-3 Fatty Acids (FISH OIL PO), Take 1 tablet by mouth 2 (two) times daily. , Disp: , Rfl:  .  potassium chloride SA (KLOR-CON) 20 MEQ tablet, Take 1 tablet (20 mEq total) by mouth daily., Disp: 90 tablet, Rfl: 3 .  ramipril (ALTACE) 10 MG capsule, Take 10 mg by mouth 2 (two) times daily., Disp: , Rfl: 0 .  tamsulosin (FLOMAX) 0.4 MG CAPS capsule, Take 0.4 mg by mouth at bedtime. , Disp: , Rfl:  .  vitamin B-12 1000 MCG tablet, Take 1 tablet (1,000 mcg total) by mouth daily., Disp: 30 tablet, Rfl: 1 .  amLODipine (NORVASC) 10 MG tablet, Take 1 tablet (10 mg total) by mouth daily., Disp: 30 tablet, Rfl: 6 .  gabapentin (NEURONTIN) 100 MG capsule, Take 1 capsule (100 mg total) by mouth at bedtime for 14 days, THEN 1 capsule (100 mg total) 2 (two) times daily for 16 days., Disp: 46 capsule, Rfl: 0 .  isosorbide mononitrate (IMDUR) 30 MG 24 hr tablet, Take 1 tablet (30 mg total) by mouth daily., Disp: 90 tablet, Rfl: 1  Imaging Review    Lumbosacral Imaging: Lumbar MR wo contrast:  Results for orders placed during the hospital encounter of 04/03/19  MR LUMBAR SPINE WO CONTRAST   Narrative CLINICAL DATA:  Initial evaluation for low back pain radiating into the right lower extremity for 3 years.  EXAM: MRI LUMBAR SPINE WITHOUT CONTRAST  TECHNIQUE: Multiplanar, multisequence MR imaging of the lumbar spine was performed. No intravenous contrast was administered.  COMPARISON:  Previous MRI from 10/02/2017.  FINDINGS: Segmentation: Standard. Lowest well-formed disc space labeled the L5-S1 level.  Alignment: Physiologic with preservation of the normal lumbar lordosis. No listhesis.  Vertebrae: Susceptibility artifact from prior interbody fusion at L4-5. Vertebral body height maintained without evidence for acute or chronic fracture. Bone marrow signal intensity within normal limits. Few scattered benign hemangiomata noted, most prominent of which measures  15 mm at the L3 vertebral body. No worrisome osseous lesions. No abnormal marrow edema.  Conus medullaris and cauda equina: Conus extends to the T12-L1 level. Conus and cauda equina appear normal.  Paraspinal and other soft tissues: Postoperative changes present within the lower posterior paraspinous soft tissues. Paraspinous soft tissues demonstrate no acute abnormality. Few T2 hyperintense simple cyst partially visualize within the right kidney. Visualized visceral structures otherwise unremarkable.  Disc levels:  L1-2:  Negative interspace. Mild facet hypertrophy. No stenosis.  L2-3:  Negative interspace. Mild facet hypertrophy. No stenosis.  L3-4: Disc desiccation with mild diffuse disc bulge. Superimposed small central disc protrusion with associated annular fissure. Moderate facet degeneration. Resultant moderate canal with moderate to severe bilateral lateral recess stenosis, similar to perhaps mildly progressed from previous. Mild bilateral L3 foraminal narrowing.  L4-5: Prior interbody fusion. Wide posterior decompression. No residual spinal stenosis. Mild to moderate bilateral L4 foraminal narrowing, stable from previous.  L5-S1: Disc desiccation without significant disc  bulge. Moderate bilateral facet hypertrophy. No significant canal or lateral recess stenosis. Mild bilateral L5 foraminal narrowing.  IMPRESSION: 1. Prior interbody fusion at L4-5 without residual or recurrent spinal stenosis. 2. Adjacent segment disease with disc bulge and facet degeneration at L3-4 with resultant moderate canal and moderate to severe bilateral lateral recess stenosis, similar to perhaps mildly progressed from previous. 3. Mild to moderate bilateral L3 through L5 foraminal stenosis, similar to previous.   Electronically Signed   By: Jeannine Boga M.D.   On: 04/04/2019 04:21     Lumbar MR w/wo contrast:  Results for orders placed in visit on 06/22/02  MR Lumbar  Spine W Wo Contrast   Narrative FINDINGS CLINICAL DATA:  PATIENT HAS LOW BACK AND LEFT LEG PAIN.  PATIENT HAS UNDERGONE PRIOR FUSION AT L4-5 WITH INTERBODY CAGES. MRI OF THE LUMBAR SPINE, 06/22/02 STUDY IS PERFORMED PRE AND POST IV CONTRAST WITH 20 CC IV CONTRAST UTILIZED. POSTOPERATIVE CHANGES ARE NOTED AT L4-5 WITH INTERBODY CAGES.  POSTOPERATIVE BILATERAL LAMINOTOMY DEFECTS DEMONSTRATED.  THERE IS EXTENSIVE EPIDURAL FIBROSIS.  ECCENTRIC LEFT, THERE IS SOFT TISSUE FOCUS AT THE LEVEL OF THE DISK SPACE.  I QUESTION WHETHER THIS REFLECTS PROMINENT FOCAL SCARRING. THIS MAY RESULT IN ENCROACHMENT UPON THE L5 ROOT CENTRALLY.  THERE IS NO APPRECIABLE ENCROACHMENT UPON THE EXITING L4 ROOTS OR THE L4 NEURAL FORAMINA.  AT L5-S1, MILD FACET DEGENERATIVE CHANGES ARE SEEN.  DISK DESICCATION WITHOUT FOCAL PROTRUSION.  THERE IS NO ENCROACHMENT UPON THE NEURAL FORAMINA.  S1 ROOTS ARE NORMAL BILATERALLY. L1-2 AND L2-3:  NORMAL. L3-4:  THERE IS DISK DESICCATION WITH SMALL ANNULAR TEAR.  THERE IS NO CENTRAL OR FORAMINAL STENOSIS. IMPRESSION POST SURGICAL CHANGES ARE SEEN AT L4-5.  SOFT TISSUE FULLNESS IS SEEN AT THE LEVEL OF THE DISK SPACE ECCENTRIC LEFT.  THIS MAY REFLECT FOCAL SCARRING AND MAY POSSIBLY RESULT IN ENCROACHMENT UPON THE L5 ROOT CENTRALLY.  IF THE PATIENT REMAINS SYMPTOMATIC AND FURTHER EVALUATION IS CLINICALLY WARRANTED, CT MYELOGRAPHY MAY BE USEFUL.    Lumbar DG 2-3 views:  Results for orders placed in visit on 11/16/01  DG Lumbar Spine 2-3 Views   Narrative FINDINGS CLINICAL DATA:  LUMBAR PAIN. LUMBAR SPINE THREE VIEWS OF THE LUMBAR SPINE ARE COMPARED TO FILMS OF 09/18/96.  RAY CAGE FUSION APPEARS STABLE AT THE L4-5 LEVEL.  INTERVERTEBRAL DISC SPACES ARE NORMAL AND DIFFUSE DEGENERATIVE CHANGES ARE STABLE. IMPRESSION STABLE RAY CAGE FUSION AT L4-5.  NORMAL ALIGNMENT.    Knee-L DG 4 views:  Results for orders placed in visit on 07/04/02  DG Knee Complete 4 Views Left   Narrative  FINDINGS CLINICAL DATA:  LEFT KNEE PAIN FOR THE PAST TWO MONTHS WITH NO RECENT INJURY.  HE INJURED THE KNEE 30 YEARS AGO WITH SOME PAIN SINCE THAT TIME. COMPLETE FOUR VIEW LEFT KNEE FOUR VIEWS DEMONSTRATE MINIMAL MEDIAL SPUR FORMATION AND MINIMAL POSTERIOR PATELLAR SPUR FORMATION.  NO JOINT SPACE NARROWING IS SEEN AND NO EFFUSION IS DEMONSTRATED. IMPRESSION MINIMAL DEGENERATIVE SPUR FORMATION AS DESCRIBED ABOVE.    Complexity Note: Imaging results reviewed. Results shared with Mr. Imhoff, using Layman's terms.                         ROS  Cardiovascular: Heart attack ( Date: 2020) Pulmonary or Respiratory: No reported pulmonary signs or symptoms such as wheezing and difficulty taking a deep full breath (Asthma), difficulty blowing air out (Emphysema), coughing up mucus (Bronchitis), persistent dry cough, or temporary stoppage of breathing  during sleep Neurological: No reported neurological signs or symptoms such as seizures, abnormal skin sensations, urinary and/or fecal incontinence, being born with an abnormal open spine and/or a tethered spinal cord endorses radiating pain from L side of neck to shoulder, R hip to thigh Psychological-Psychiatric: Difficulty sleeping and or falling asleep due to pain Gastrointestinal: No reported gastrointestinal signs or symptoms such as vomiting or evacuating blood, reflux, heartburn, alternating episodes of diarrhea and constipation, inflamed or scarred liver, or pancreas or irrregular and/or infrequent bowel movements Genitourinary: Kidney disease Hematological: No reported hematological signs or symptoms such as prolonged bleeding, low or poor functioning platelets, bruising or bleeding easily, hereditary bleeding problems, low energy levels due to low hemoglobin or being anemic Endocrine: High blood sugar controlled without the use of insulin (NIDDM) Rheumatologic: No reported rheumatological signs and symptoms such as fatigue, joint pain, tenderness,  swelling, redness, heat, stiffness, decreased range of motion, with or without associated rash Musculoskeletal: Negative for myasthenia gravis, muscular dystrophy, multiple sclerosis or malignant hyperthermia Work History: Disabled  Allergies  Mr. Brummet is allergic to morphine and related.  Laboratory Chemistry Profile   Renal Lab Results  Component Value Date   BUN 31 (H) 02/11/2019   CREATININE 1.73 (H) 02/11/2019   GFRAA 42 (L) 02/11/2019   GFRNONAA 36 (L) 02/11/2019    Electrolytes Lab Results  Component Value Date   NA 136 02/11/2019   K 4.7 02/11/2019   CL 106 02/11/2019   CALCIUM 9.1 02/11/2019   MG 1.9 12/31/2018    Hepatic Lab Results  Component Value Date   AST 20 12/29/2018   ALT 18 12/29/2018   ALBUMIN 3.0 (L) 12/29/2018   ALKPHOS 69 12/29/2018    ID Lab Results  Component Value Date   SARSCOV2NAA NEGATIVE 12/28/2018   MRSAPCR NEGATIVE 09/08/2017    Bone No results found for: VD25OH, XT056PV9YIA, XK5537SM2, LM7867JQ4, 25OHVITD1, 25OHVITD2, 25OHVITD3, TESTOFREE, TESTOSTERONE  Endocrine Lab Results  Component Value Date   GLUCOSE 193 (H) 02/11/2019   HGBA1C 7.2 (H) 12/29/2018    Neuropathy Lab Results  Component Value Date   VITAMINB12 119 (L) 12/28/2018   FOLATE 9.1 12/28/2018   HGBA1C 7.2 (H) 12/29/2018    CNS Lab Results  Component Value Date   COLORCSF CLEAR (A) 09/09/2017   APPEARCSF CLEAR (A) 09/09/2017   RBCCOUNTCSF 8 (H) 09/09/2017   WBCCSF 1 09/09/2017    Inflammation (CRP: Acute  ESR: Chronic) Lab Results  Component Value Date   CRP 2.4 (H) 11/20/2018   ESRSEDRATE 72 (H) 11/20/2018   LATICACIDVEN 1.7 09/09/2017    Rheumatology No results found for: RF, ANA, LABURIC, URICUR, LYMEIGGIGMAB, LYMEABIGMQN, HLAB27  Coagulation Lab Results  Component Value Date   INR 1.1 12/28/2018   LABPROT 14.3 12/28/2018   APTT 27 11/20/2018   PLT 199 12/30/2018   DDIMER 1.71 (H) 12/28/2018    Cardiovascular Lab Results  Component Value  Date   BNP 229.0 (H) 02/05/2019   CKTOTAL 51 11/20/2018   CKMB 4.5 11/20/2018   HGB 9.4 (L) 12/30/2018   HCT 28.0 (L) 12/30/2018    Screening Lab Results  Component Value Date   SARSCOV2NAA NEGATIVE 12/28/2018   MRSAPCR NEGATIVE 09/08/2017    Cancer No results found for: CEA, CA125, LABCA2  Allergens No results found for: ALMOND, APPLE, ASPARAGUS, AVOCADO, BANANA, BARLEY, BASIL, BAYLEAF, GREENBEAN, LIMABEAN, WHITEBEAN, BEEFIGE, REDBEET, BLUEBERRY, BROCCOLI, CABBAGE, MELON, CARROT, CASEIN, CASHEWNUT, CAULIFLOWER, CELERY    Note: Lab results reviewed.   PFSH  Drug: Mr.  [name]  reports no history of drug use. Alcohol:  reports no history of alcohol use. Tobacco:  reports that he has never smoked. He has never used smokeless tobacco. Medical:  has a past medical history of BPH (benign prostatic hyperplasia), Cancer ([place]), Chronic renal insufficiency, stage 3 (moderate), Hyperlipidemia, Hypertension, Non-ST elevated myocardial infarction (non-STEMI) ([place]), Syncope, and Type 2 diabetes mellitus ([place]). Family: family history includes CVA in his father; Hypertension in his father.  Past Surgical History:  Procedure Laterality Date  . APPENDECTOMY    . BACK SURGERY    . LEFT HEART CATH AND CORONARY ANGIOGRAPHY N/A 06/27/2017   Procedure: LEFT HEART CATH AND CORONARY ANGIOGRAPHY;  Surgeon: Burnell Blanks, MD;  Location: [facility];  Service: Cardiovascular;  Laterality: N/A;  . SURGERY OF LIP     Active Ambulatory Problems    Diagnosis Date Noted  . Abnormal stress test   . CAD (coronary artery disease) 07/11/2017  . History of non-ST elevation myocardial infarction (NSTEMI) 07/11/2017  . Leg pain, bilateral 07/11/2017  . Generalized headaches 09/08/2017  . DDD (degenerative disc disease), cervical 09/08/2017  . DM2 (diabetes mellitus, type 2) ([place]) 09/08/2017  . Benign essential HTN 09/08/2017  . Sepsis ([place]) 09/09/2017  . NSTEMI (non-ST elevated myocardial  infarction) ([place]) 11/20/2018  . Chest pain 11/20/2018  . COVID-19 virus infection 11/20/2018  . Dyspnea 12/28/2018  . Acute CHF (congestive heart failure) ([place]) 12/29/2018  . Acute combined systolic and diastolic HF (heart failure) ([place])   . Chronic combined systolic and diastolic CHF (congestive heart failure) ([place]) 01/09/2019  . CKD (chronic kidney disease), stage III 01/09/2019  . NSVT (nonsustained ventricular tachycardia) ([place]) 01/09/2019  . Chronic pain syndrome 04/24/2019  . Lumbar radiculopathy 04/24/2019  . Lumbar facet arthropathy 04/24/2019  . Spinal stenosis, lumbar region, with neurogenic claudication 04/24/2019  . History of lumbar fusion 04/24/2019  . Cervical radicular pain 04/24/2019  . Cervical facet joint syndrome 04/24/2019  . S/P cervical spinal fusion 04/24/2019   Resolved Ambulatory Problems    Diagnosis Date Noted  . SIRS (systemic inflammatory response syndrome) ([place]) 09/08/2017   Past Medical History:  Diagnosis Date  . BPH (benign prostatic hyperplasia)   . Cancer ([place])   . Chronic renal insufficiency, stage 3 (moderate)   . Hyperlipidemia   . Hypertension   . Non-ST elevated myocardial infarction (non-STEMI) ([place])   . Syncope   . Type 2 diabetes mellitus ([place])    Constitutional Exam  General appearance: Well nourished, well developed, and well hydrated. In no apparent acute distress Vitals:   04/24/19 0844  BP: (!) 150/73  Pulse: (!) 59  Temp: (!) 97.4 F (36.3 C)  SpO2: 100%  Weight: 232 lb (105.2 kg)  Height: '5\' 11"'  (1.803 m)   BMI Assessment: Estimated body mass index is 32.36 kg/m as calculated from the following:   Height as of this encounter: '5\' 11"'  (1.803 m).   Weight as of this encounter: 232 lb (105.2 kg).  BMI interpretation table: BMI level Category Range association with higher incidence of chronic pain  <18 kg/m2 Underweight   18.5-24.9 kg/m2 Ideal body weight   25-29.9 kg/m2 Overweight Increased incidence by 20%  30-34.9  kg/m2 Obese (Class I) Increased incidence by 68%  35-39.9 kg/m2 Severe obesity (Class II) Increased incidence by 136%  >40 kg/m2 Extreme obesity (Class III) Increased incidence by 254%   Patient's current BMI Ideal Body weight  Body mass index is 32.36 kg/m. Ideal body weight: 75.3  kg (166 lb 0.1 oz) Adjusted ideal body weight: 87.3 kg (192 lb 6.5 oz)   BMI Readings from Last 4 Encounters:  04/24/19 32.36 kg/m  03/22/19 33.69 kg/m  02/04/19 31.85 kg/m  01/15/19 31.85 kg/m   Wt Readings from Last 4 Encounters:  04/24/19 232 lb (105.2 kg)  03/22/19 234 lb 12.8 oz (106.5 kg)  02/04/19 222 lb (100.7 kg)  01/15/19 222 lb (100.7 kg)    Psych/Mental status: Alert, oriented x 3 (person, place, & time)       Eyes: PERLA Respiratory: No evidence of acute respiratory distress  Cervical Spine Area Exam  Skin & Axial Inspection: Well healed scar from previous spine surgery detected Alignment: Symmetrical Functional ROM: Fused      Stability: Possibly unstable Muscle Tone/Strength: Functionally intact. No obvious neuro-muscular anomalies detected. Sensory (Neurological): Musculoskeletal pain pattern Palpation: No palpable anomalies              Upper Extremity (UE) Exam    Side: Right upper extremity  Side: Left upper extremity  Skin & Extremity Inspection: Skin color, temperature, and hair growth are WNL. No peripheral edema or cyanosis. No masses, redness, swelling, asymmetry, or associated skin lesions. No contractures.  Skin & Extremity Inspection: Skin color, temperature, and hair growth are WNL. No peripheral edema or cyanosis. No masses, redness, swelling, asymmetry, or associated skin lesions. No contractures.  Functional ROM: Unrestricted ROM          Functional ROM: Unrestricted ROM          Muscle Tone/Strength: Functionally intact. No obvious neuro-muscular anomalies detected.  Muscle Tone/Strength: Functionally intact. No obvious neuro-muscular anomalies detected.  Sensory  (Neurological): Unimpaired          Sensory (Neurological): Unimpaired          Palpation: No palpable anomalies              Palpation: No palpable anomalies              Provocative Test(s):  Phalen's test: deferred Tinel's test: deferred Apley's scratch test (touch opposite shoulder):  Action 1 (Across chest): deferred Action 2 (Overhead): deferred Action 3 (LB reach): deferred   Provocative Test(s):  Phalen's test: deferred Tinel's test: deferred Apley's scratch test (touch opposite shoulder):  Action 1 (Across chest): deferred Action 2 (Overhead): deferred Action 3 (LB reach): deferred    Thoracic Spine Area Exam  Skin & Axial Inspection: No masses, redness, or swelling Alignment: Symmetrical Functional ROM: Unrestricted ROM Stability: No instability detected Muscle Tone/Strength: Functionally intact. No obvious neuro-muscular anomalies detected. Sensory (Neurological): Unimpaired Muscle strength & Tone: No palpable anomalies  Lumbar Spine Area Exam  Skin & Axial Inspection: No masses, redness, or swelling Alignment: Symmetrical Functional ROM: Decreased ROM       Stability: No instability detected Muscle Tone/Strength: Functionally intact. No obvious neuro-muscular anomalies detected. Sensory (Neurological): Musculoskeletal pain pattern and dermatomal Palpation: No palpable anomalies         Gait & Posture Assessment  Ambulation: Patient ambulates using a cane Gait: Limited. Using assistive device to ambulate Posture: Difficulty with positional changes   Lower Extremity Exam    Side: Right lower extremity  Side: Left lower extremity  Stability: No instability observed          Stability: No instability observed          Skin & Extremity Inspection: Skin color, temperature, and hair growth are WNL. No peripheral edema or cyanosis. No masses, redness, swelling, asymmetry, or  associated skin lesions. No contractures.  Skin & Extremity Inspection: Skin color,  temperature, and hair growth are WNL. No peripheral edema or cyanosis. No masses, redness, swelling, asymmetry, or associated skin lesions. No contractures.  Functional ROM: Unrestricted ROM                  Functional ROM: Unrestricted ROM                  Muscle Tone/Strength: Functionally intact. No obvious neuro-muscular anomalies detected.  Muscle Tone/Strength: Functionally intact. No obvious neuro-muscular anomalies detected.  Sensory (Neurological): Dermatomal pain pattern        Sensory (Neurological): Dermatomal pain pattern        DTR: Patellar: deferred today Achilles: deferred today Plantar: deferred today  DTR: Patellar: deferred today Achilles: deferred today Plantar: deferred today  Palpation: No palpable anomalies  Palpation: No palpable anomalies   Assessment  Primary Diagnosis & Pertinent Problem List: The primary encounter diagnosis was Chronic pain syndrome. Diagnoses of Chronic radicular lumbar pain, Lumbar radiculopathy, Lumbar facet arthropathy, Spinal stenosis, lumbar region, with neurogenic claudication, History of lumbar fusion, Cervical radicular pain, Cervical facet joint syndrome, and S/P cervical spinal fusion were also pertinent to this visit.  Visit Diagnosis (New problems to examiner): 1. Chronic pain syndrome   2. Chronic radicular lumbar pain   3. Lumbar radiculopathy   4. Lumbar facet arthropathy   5. Spinal stenosis, lumbar region, with neurogenic claudication   6. History of lumbar fusion   7. Cervical radicular pain   8. Cervical facet joint syndrome   9. S/P cervical spinal fusion    Plan of Care (Initial workup plan)  Note: Mr. Gales was reminded that as per protocol, today's visit has been an evaluation only. We have not taken over the patient's controlled substance management. Patient is a pleasant 85 year old male with a history of lumbar spine surgery (x4), history of cervical spine surgery who presents with a chief complaint of neck pain  that radiates out to bilateral shoulders along with right hip pain.  Patient states that they used his iliac crest for grafting in the past.  He has significant pain all over.  He has difficulty walking.  He ambulates with a cane.  He has muscle stiffness and tightness.  Of note patient has had cervical x-rays done approximately 3 months ago.  I am unable to see the results in epic.  I requested radiology reads and x-ray imaging.    Regards to the patient's lumbar spine, he has a history of prior L4-L5 lumbar interbody fusion and wide posterior decompression.  He does have adjacent segment disease at L3-L4 with a disc protrusion along with moderate facet arthropathy and resultant moderate canal stenosis and moderate to severe bilateral recess stenosis.  This is progressed from his previous MRI.  Of note, patient has had a right hip steroid injection in the past which was not helpful.  I informed the patient that the majority of his symptoms are related to his L3 central canal spinal and foraminal stenosis.  Could consider L3-L4 epidural steroid injection for radicular pain.  Patient has tried gabapentin in the past but it may have been too high of a dose as it made him dizzy and sedated.  Will be trial of gabapentin at a much lower dose starting with 100 mg at bedtime with titration instructions provided below.  Plan: -UDS today (utilizes Norco 5 mg nightly as needed) -Request cervical spine x-ray records.  After review,  will discuss treatment plan with patient. -Consider L3-L4 ESI for L3 radicular pain -Future considerations could include lumbar facet medial branch nerve blocks lumbar facet arthropathy at L3-L4, L4-L5, L5-S1   Lab Orders     Compliance Drug Analysis, Ur Pharmacotherapy (current): Medications ordered:  Meds ordered this encounter  Medications  . gabapentin (NEURONTIN) 100 MG capsule    Sig: Take 1 capsule (100 mg total) by mouth at bedtime for 14 days, THEN 1 capsule (100 mg  total) 2 (two) times daily for 16 days.    Dispense:  46 capsule    Refill:  0   Medications administered during this visit: Awais M. Cosgriff had no medications administered during this visit.   Pharmacological management options:  Opioid Analgesics: The patient was informed that there is no guarantee that he would be a candidate for opioid analgesics. The decision will be made following CDC guidelines. This decision will be based on the results of diagnostic studies, as well as Mr. Heslin risk profile. Norco 5 mg qhs   Membrane stabilizer: low dose Gabapentin trial as above, consider Lyrica or Cymbalta in future  Muscle relaxant: Not indicated, mAy increase fall risk, try and avoid  NSAID: Medically contraindicated; CKD  Other analgesic(s): consider topical therapies   Interventional management options: Mr. Beckles was informed that there is no guarantee that he would be a candidate for interventional therapies. The decision will be based on the results of diagnostic studies, as well as Mr. Ala risk profile.  Procedure(s) under consideration:  L3/4 ESI  Cervical facet blocks (pending xray) Cervical esi Possible SI-J   Provider-requested follow-up: Return in about 2 weeks (around 05/08/2019) for Medication Management, virtual.  Future Appointments  Date Time Provider [place]  05/09/2019 11:30 AM Gillis Santa, MD [facility] None    Note by: Gillis Santa, MD Date: 04/24/2019; Time: 2:54 PM

## 2019-04-24 NOTE — Patient Instructions (Signed)
1.  Start gabapentin as prescribed.  This will be a stepwise dose escalation to minimize any side effects. 2.  We have already collected the urine screen 3.  We will need to get x-rays of your cervical spine. 4.  We will touch base in 2 weeks to discuss interventional treatment options after getting your cervical spine x-ray imaging also take over the hydrocodone.

## 2019-04-24 NOTE — Progress Notes (Signed)
Safety precautions to be maintained throughout the outpatient stay will include: orient to surroundings, keep bed in low position, maintain call bell within reach at all times, provide assistance with transfer out of bed and ambulation.  

## 2019-04-25 ENCOUNTER — Ambulatory Visit: Payer: Medicare Other | Admitting: Student in an Organized Health Care Education/Training Program

## 2019-04-27 ENCOUNTER — Other Ambulatory Visit: Payer: Self-pay

## 2019-04-27 ENCOUNTER — Encounter (HOSPITAL_COMMUNITY): Payer: Self-pay | Admitting: Emergency Medicine

## 2019-04-27 ENCOUNTER — Emergency Department (HOSPITAL_COMMUNITY)
Admission: EM | Admit: 2019-04-27 | Discharge: 2019-04-27 | Disposition: A | Discharge status: Home / Self Care | Payer: Medicare Other | Attending: Emergency Medicine | Admitting: Emergency Medicine

## 2019-04-27 DIAGNOSIS — Z7982 Long term (current) use of aspirin: Secondary | ICD-10-CM | POA: Insufficient documentation

## 2019-04-27 DIAGNOSIS — Z86718 Personal history of other venous thrombosis and embolism: Secondary | ICD-10-CM | POA: Diagnosis not present

## 2019-04-27 DIAGNOSIS — M7989 Other specified soft tissue disorders: Secondary | ICD-10-CM

## 2019-04-27 DIAGNOSIS — E1122 Type 2 diabetes mellitus with diabetic chronic kidney disease: Secondary | ICD-10-CM | POA: Insufficient documentation

## 2019-04-27 DIAGNOSIS — N183 Chronic kidney disease, stage 3 unspecified: Secondary | ICD-10-CM | POA: Insufficient documentation

## 2019-04-27 DIAGNOSIS — I130 Hypertensive heart and chronic kidney disease with heart failure and stage 1 through stage 4 chronic kidney disease, or unspecified chronic kidney disease: Secondary | ICD-10-CM | POA: Diagnosis not present

## 2019-04-27 DIAGNOSIS — I5042 Chronic combined systolic (congestive) and diastolic (congestive) heart failure: Secondary | ICD-10-CM | POA: Insufficient documentation

## 2019-04-27 DIAGNOSIS — I252 Old myocardial infarction: Secondary | ICD-10-CM | POA: Diagnosis not present

## 2019-04-27 DIAGNOSIS — R1031 Right lower quadrant pain: Secondary | ICD-10-CM | POA: Diagnosis present

## 2019-04-27 DIAGNOSIS — Z7984 Long term (current) use of oral hypoglycemic drugs: Secondary | ICD-10-CM | POA: Insufficient documentation

## 2019-04-27 DIAGNOSIS — Z79899 Other long term (current) drug therapy: Secondary | ICD-10-CM | POA: Insufficient documentation

## 2019-04-27 DIAGNOSIS — R2241 Localized swelling, mass and lump, right lower limb: Secondary | ICD-10-CM | POA: Insufficient documentation

## 2019-04-27 LAB — CBC WITH DIFFERENTIAL/PLATELET
Abs Immature Granulocytes: 0.08 10*3/uL — ABNORMAL HIGH (ref 0.00–0.07)
Basophils Absolute: 0 10*3/uL (ref 0.0–0.1)
Basophils Relative: 0 %
Eosinophils Absolute: 0.2 10*3/uL (ref 0.0–0.5)
Eosinophils Relative: 3 %
HCT: 34.7 % — ABNORMAL LOW (ref 39.0–52.0)
Hemoglobin: 11 g/dL — ABNORMAL LOW (ref 13.0–17.0)
Immature Granulocytes: 1 %
Lymphocytes Relative: 31 %
Lymphs Abs: 2 10*3/uL (ref 0.7–4.0)
MCH: 31.3 pg (ref 26.0–34.0)
MCHC: 31.7 g/dL (ref 30.0–36.0)
MCV: 98.9 fL (ref 80.0–100.0)
Monocytes Absolute: 0.5 10*3/uL (ref 0.1–1.0)
Monocytes Relative: 7 %
Neutro Abs: 3.8 10*3/uL (ref 1.7–7.7)
Neutrophils Relative %: 58 %
Platelets: 201 10*3/uL (ref 150–400)
RBC: 3.51 MIL/uL — ABNORMAL LOW (ref 4.22–5.81)
RDW: 13.4 % (ref 11.5–15.5)
WBC: 6.5 10*3/uL (ref 4.0–10.5)
nRBC: 0 % (ref 0.0–0.2)

## 2019-04-27 LAB — BASIC METABOLIC PANEL
Anion gap: 5 (ref 5–15)
BUN: 31 mg/dL — ABNORMAL HIGH (ref 8–23)
CO2: 21 mmol/L — ABNORMAL LOW (ref 22–32)
Calcium: 8.9 mg/dL (ref 8.9–10.3)
Chloride: 111 mmol/L (ref 98–111)
Creatinine, Ser: 1.66 mg/dL — ABNORMAL HIGH (ref 0.61–1.24)
GFR calc Af Amer: 44 mL/min — ABNORMAL LOW (ref >60)
GFR calc non Af Amer: 38 mL/min — ABNORMAL LOW (ref >60)
Glucose, Bld: 221 mg/dL — ABNORMAL HIGH (ref 70–99)
Potassium: 6.3 mmol/L (ref 3.5–5.1)
Sodium: 137 mmol/L (ref 135–145)

## 2019-04-27 LAB — COMPLIANCE DRUG ANALYSIS, UR

## 2019-04-27 MED ORDER — ENOXAPARIN SODIUM 120 MG/0.8ML ~~LOC~~ SOLN
1.0000 mg/kg | Freq: Once | SUBCUTANEOUS | Status: AC
  Administered 2019-04-27: 105 mg via SUBCUTANEOUS
  Filled 2019-04-27: qty 0.8

## 2019-04-27 NOTE — ED Notes (Signed)
Call to daughter Beckie Busing  [phone]  She is on the way   And is made aware of need for father to return in the am at [phone]

## 2019-04-27 NOTE — ED Notes (Signed)
UA to acquire meds  Call to [place] to request release  "You're supposed to call womens"  Call to [facility] and request release

## 2019-04-27 NOTE — Discharge Instructions (Signed)
Return here tomorrow for doppler study

## 2019-04-27 NOTE — ED Notes (Signed)
Date and time results received: 04/27/19 .now  (use smartphrase ".now" to insert current time)  Test: [place]  Critical Value: 6.3  Name of Provider Notified: Lacinda Axon  Orders Received? Or Actions Taken?: Orders Received - See Orders for details

## 2019-04-27 NOTE — ED Triage Notes (Signed)
Patient C/o right groin pain, worse with movement and bearing weight. Per patient some swelling noted in lower leg. Patient concerned for DVT in which he has a hx and had similar pain. Pedal pulse present, capillary refill WNL. Patient states " he only takes aspirin."

## 2019-04-27 NOTE — ED Provider Notes (Signed)
[facility] EMERGENCY DEPARTMENT Provider Note   CSN: [id number] Arrival date & time: 04/27/19  1323     History Chief Complaint  Patient presents with  . Groin Pain    Michael Cobb is a 85 y.o. male.  The history is provided by the patient. No language interpreter was used.  Groin Pain This is a new problem. The current episode started yesterday. The problem occurs constantly. The problem has been gradually worsening. Nothing aggravates the symptoms. Nothing relieves the symptoms. He has tried nothing for the symptoms. The treatment provided no relief.  Pt complains of swelling to his right leg.  Pt has had a dvt in his left leg in the past     Past Medical History:  Diagnosis Date  . BPH (benign prostatic hyperplasia)   . Cancer (HCC)    Skin  . Chronic renal insufficiency, stage 3 (moderate)   . Hyperlipidemia   . Hypertension   . Non-ST elevated myocardial infarction (non-STEMI) ([place])   . Syncope   . Type 2 diabetes mellitus [facility])     Patient Active Problem List   Diagnosis Date Noted  . Chronic pain syndrome 04/24/2019  . Lumbar radiculopathy 04/24/2019  . Lumbar facet arthropathy 04/24/2019  . Spinal stenosis, lumbar region, with neurogenic claudication 04/24/2019  . History of lumbar fusion 04/24/2019  . Cervical radicular pain 04/24/2019  . Cervical facet joint syndrome 04/24/2019  . S/P cervical spinal fusion 04/24/2019  . Chronic combined systolic and diastolic CHF (congestive heart failure) ([place]) 01/09/2019  . CKD (chronic kidney disease), stage III 01/09/2019  . NSVT (nonsustained ventricular tachycardia) ([place]) 01/09/2019  . Acute combined systolic and diastolic HF (heart failure) ([place])   . Acute CHF (congestive heart failure) ([place]) 12/29/2018  . Dyspnea 12/28/2018  . NSTEMI (non-ST elevated myocardial infarction) ([place]) 11/20/2018  . Chest pain 11/20/2018  . COVID-19 virus infection 11/20/2018  . Sepsis ([place]) 09/09/2017  . Generalized headaches  09/08/2017  . DDD (degenerative disc disease), cervical 09/08/2017  . DM2 (diabetes mellitus, type 2) ([place]) 09/08/2017  . Benign essential HTN 09/08/2017  . CAD (coronary artery disease) 07/11/2017  . History of non-ST elevation myocardial infarction (NSTEMI) 07/11/2017  . Leg pain, bilateral 07/11/2017  . Abnormal stress test     Past Surgical History:  Procedure Laterality Date  . APPENDECTOMY    . BACK SURGERY    . LEFT HEART CATH AND CORONARY ANGIOGRAPHY N/A 06/27/2017   Procedure: LEFT HEART CATH AND CORONARY ANGIOGRAPHY;  Surgeon: Burnell Blanks, MD;  Location: [place] CV LAB;  Service: Cardiovascular;  Laterality: N/A;  . SURGERY OF LIP         Family History  Problem Relation Age of Onset  . Hypertension Father   . CVA Father     Social History   Tobacco Use  . Smoking status: Never Smoker  . Smokeless tobacco: Never Used  Substance Use Topics  . Alcohol use: Never  . Drug use: Never    Home Medications Prior to Admission medications   Medication Sig Start Date End Date Taking? Authorizing Provider  acetaminophen (TYLENOL) 500 MG tablet Take 500 mg by mouth every 6 (six) hours as needed for mild pain or moderate pain.   Yes [provider]  amLODipine (NORVASC) 10 MG tablet Take 1 tablet (10 mg total) by mouth daily. 02/25/19 04/27/19 Yes Herminio Commons, MD  aspirin EC 81 MG tablet Take 81 mg by mouth at bedtime.    Yes  [provider]  cetirizine (ZYRTEC) 10 MG tablet Take 10 mg by mouth at bedtime.    Yes [provider]  docusate sodium (COLACE) 100 MG capsule Take 100 mg by mouth 2 (two) times daily.   Yes [provider]  Evolocumab (REPATHA SURECLICK) [id number] MG/ML SOAJ Inject 140 mg into the skin every 14 (fourteen) days. 03/26/19  Yes Herminio Commons, MD  ferrous sulfate 325 (65 FE) MG tablet Take 1 tablet (325 mg total) by mouth 2 (two) times daily with a meal. 12/31/18  Yes Hosie Poisson, MD    fluticasone (FLONASE) 50 MCG/ACT nasal spray Place 1 spray into both nostrils daily as needed for allergies or rhinitis.    Yes [provider]  furosemide (LASIX) 40 MG tablet Take 1 tablet (40 mg total) by mouth daily. 01/01/19  Yes Hosie Poisson, MD  gabapentin (NEURONTIN) 100 MG capsule Take 1 capsule (100 mg total) by mouth at bedtime for 14 days, THEN 1 capsule (100 mg total) 2 (two) times daily for 16 days. 04/24/19 05/24/19 Yes Gillis Santa, MD  glipiZIDE (GLUCOTROL) 10 MG tablet Take 10 mg by mouth 2 (two) times daily before a meal.  02/23/17  Yes [provider]  meclizine (ANTIVERT) 25 MG tablet Take 25 mg by mouth 3 (three) times daily as needed for dizziness.   Yes [provider]  Omega-3 Fatty Acids (FISH OIL PO) Take 1 tablet by mouth 2 (two) times daily.    Yes [provider]  potassium chloride SA (KLOR-CON) 20 MEQ tablet Take 1 tablet (20 mEq total) by mouth daily. 02/08/19  Yes Isaiah Serge, NP  ramipril (ALTACE) 10 MG capsule Take 10 mg by mouth 2 (two) times daily. 04/07/17  Yes [provider]  tamsulosin (FLOMAX) 0.4 MG CAPS capsule Take 0.4 mg by mouth at bedtime.    Yes [provider]  vitamin B-12 1000 MCG tablet Take 1 tablet (1,000 mcg total) by mouth daily. 01/01/19  Yes Hosie Poisson, MD  isosorbide mononitrate (IMDUR) 30 MG 24 hr tablet Take 1 tablet (30 mg total) by mouth daily. 02/06/19 03/22/19  Herminio Commons, MD    Allergies    Morphine and related  Review of Systems   Review of Systems  All other systems reviewed and are negative.   Physical Exam Updated Vital Signs BP (!) 158/68 (BP Location: Left Arm)   Pulse (!) 55   Temp 98.6 F (37 C) (Oral)   Resp 16   Ht 5\' 11"  (1.803 m)   Wt 105.2 kg   SpO2 100%   BMI 32.36 kg/m   Physical Exam Vitals and nursing note reviewed.  Constitutional:      Appearance: He is well-developed.  HENT:     Head: Normocephalic and atraumatic.  Eyes:      Conjunctiva/sclera: Conjunctivae normal.  Cardiovascular:     Rate and Rhythm: Normal rate and regular rhythm.     Heart sounds: No murmur.  Pulmonary:     Effort: Pulmonary effort is normal. No respiratory distress.     Breath sounds: Normal breath sounds.  Abdominal:     Tenderness: There is no abdominal tenderness.  Musculoskeletal:        General: Swelling present.  Skin:    General: Skin is warm and dry.  Neurological:     Mental Status: He is alert.  Psychiatric:        Mood and Affect: Mood normal.     ED  Results / Procedures / Treatments   Labs (all labs ordered are listed, but only abnormal results are displayed) Labs Reviewed  CBC WITH DIFFERENTIAL/PLATELET - Abnormal; Notable for the following components:      Result Value   RBC 3.51 (*)    Hemoglobin 11.0 (*)    HCT 34.7 (*)    Abs Immature Granulocytes 0.08 (*)    All other components within normal limits  BASIC METABOLIC PANEL - Abnormal; Notable for the following components:   Potassium 6.3 (*)    CO2 21 (*)    Glucose, Bld 221 (*)    BUN 31 (*)    Creatinine, Ser 1.66 (*)    GFR calc non Af Amer 38 (*)    GFR calc Af Amer 44 (*)    All other components within normal limits    EKG None  Radiology No results found.  Procedures Procedures (including critical care time)  Medications Ordered in ED Medications  enoxaparin (LOVENOX) injection 105 mg (has no administration in time range)    ED Course  I have reviewed the triage vital signs and the nursing notes.  Pertinent labs & imaging results that were available during my care of the patient were reviewed by me and considered in my medical decision making (see chart for details).    MDM Rules/Calculators/A&P                      MDM:  Pt given injection of lovenox, Pt to return for doppler. Final Clinical Impression(s) / ED Diagnoses Final diagnoses:  Right leg swelling    Rx / DC Orders ED Discharge Orders         Ordered    US  Venous Img Lower Unilateral Right (DVT)     04/27/19 1449        An After Visit Summary was printed and given to the patient.    Fransico Meadow, Hershal Coria 04/27/19 [place], Joseph, MD 04/28/19 [phone]

## 2019-04-28 ENCOUNTER — Ambulatory Visit (HOSPITAL_COMMUNITY)
Admission: RE | Admit: 2019-04-28 | Discharge: 2019-04-28 | Disposition: A | Discharge status: Home / Self Care | Payer: Medicare Other | Source: Ambulatory Visit | Attending: Emergency Medicine | Admitting: Emergency Medicine

## 2019-04-28 DIAGNOSIS — M79661 Pain in right lower leg: Secondary | ICD-10-CM | POA: Diagnosis not present

## 2019-04-28 DIAGNOSIS — M7989 Other specified soft tissue disorders: Secondary | ICD-10-CM | POA: Insufficient documentation

## 2019-05-01 ENCOUNTER — Other Ambulatory Visit: Payer: Self-pay | Admitting: Cardiovascular Disease

## 2019-05-08 ENCOUNTER — Telehealth: Payer: Self-pay

## 2019-05-08 ENCOUNTER — Telehealth: Payer: Self-pay | Admitting: *Deleted

## 2019-05-08 NOTE — Telephone Encounter (Signed)
Attempted to call for pre appointment review of allergies/meds. Message left. 

## 2019-05-08 NOTE — Telephone Encounter (Signed)
Called patient to review medications for VV. No answer, left message to call us back.

## 2019-05-09 ENCOUNTER — Other Ambulatory Visit: Payer: Self-pay

## 2019-05-09 ENCOUNTER — Ambulatory Visit
Payer: Medicare Other | Attending: Student in an Organized Health Care Education/Training Program | Admitting: Student in an Organized Health Care Education/Training Program

## 2019-05-09 ENCOUNTER — Telehealth: Payer: Self-pay

## 2019-05-09 ENCOUNTER — Encounter: Payer: Self-pay | Admitting: Student in an Organized Health Care Education/Training Program

## 2019-05-09 ENCOUNTER — Telehealth: Payer: Self-pay | Admitting: *Deleted

## 2019-05-09 DIAGNOSIS — M48062 Spinal stenosis, lumbar region with neurogenic claudication: Secondary | ICD-10-CM | POA: Diagnosis not present

## 2019-05-09 DIAGNOSIS — G8929 Other chronic pain: Secondary | ICD-10-CM

## 2019-05-09 DIAGNOSIS — G894 Chronic pain syndrome: Secondary | ICD-10-CM

## 2019-05-09 DIAGNOSIS — Z981 Arthrodesis status: Secondary | ICD-10-CM

## 2019-05-09 DIAGNOSIS — M5416 Radiculopathy, lumbar region: Secondary | ICD-10-CM

## 2019-05-09 DIAGNOSIS — M4726 Other spondylosis with radiculopathy, lumbar region: Secondary | ICD-10-CM

## 2019-05-09 DIAGNOSIS — M4722 Other spondylosis with radiculopathy, cervical region: Secondary | ICD-10-CM

## 2019-05-09 DIAGNOSIS — M47812 Spondylosis without myelopathy or radiculopathy, cervical region: Secondary | ICD-10-CM

## 2019-05-09 DIAGNOSIS — M47816 Spondylosis without myelopathy or radiculopathy, lumbar region: Secondary | ICD-10-CM

## 2019-05-09 DIAGNOSIS — Z0289 Encounter for other administrative examinations: Secondary | ICD-10-CM | POA: Insufficient documentation

## 2019-05-09 DIAGNOSIS — M5412 Radiculopathy, cervical region: Secondary | ICD-10-CM

## 2019-05-09 MED ORDER — HYDROCODONE-ACETAMINOPHEN 5-325 MG PO TABS: 1.0000 | ORAL_TABLET | Freq: Two times a day (BID) | ORAL | 0 refills | Status: DC | PRN

## 2019-05-09 MED ORDER — GABAPENTIN 100 MG PO CAPS: 100.0000 mg | ORAL_CAPSULE | Freq: Every day | ORAL | 2 refills | Status: DC

## 2019-05-09 NOTE — Telephone Encounter (Signed)
Attempted to call patient to review medications for today's appt. No answer. Left message to call office back.

## 2019-05-09 NOTE — Telephone Encounter (Signed)
Called to go over medications prior to upcoming appt.

## 2019-05-09 NOTE — Progress Notes (Signed)
Patient: Michael Cobb  Service Category: E/M  Provider: Gillis Santa, MD  DOB: 05-14-1934  DOS: 05/09/2019  Location: Office  MRN: [id number]  Setting: Ambulatory outpatient  Referring Provider: Leeanne Rio, MD  Type: Established Patient  Specialty: Interventional Pain Management  PCP: Leeanne Rio, MD  Location: Home  Delivery: TeleHealth     Virtual Encounter - Pain Management PROVIDER NOTE: Information contained herein reflects review and annotations entered in association with encounter. Interpretation of such information and data should be left to medically-trained personnel. Information provided to patient can be located elsewhere in the medical record under "Patient Instructions". Document created using STT-dictation technology, any transcriptional errors that may result from process are unintentional.    Contact & Pharmacy Preferred: [phone] Home: [phone] (home) Mobile: There is no such number on file (mobile). E-mail: No e-mail address on record  [place], [place] - [employer] [place] [place] [address] Phone: [phone] Fax: [phone]   Pre-screening note:  Our staff contacted [name] and offered him an "in person", "face-to-face" appointment versus a telephone encounter. He indicated preferring the telephone encounter, at this time.   Primary Reason(s) for Virtual Visit: Encounter for evaluation before starting new chronic pain management plan of care (Level of risk: moderate) COVID-19*  Social distancing based on CDC ans AMA recommendations.    I contacted RENATO Cobb on 05/09/2019 via telephone.      I clearly identified myself as Gillis Santa, MD. I verified that I was speaking with the correct person using two identifiers (Name: Michael Cobb, and date of birth: September 26, 1934).   This visit was completed via telephone due to the restrictions of the COVID-19 pandemic. All issues as above were discussed and addressed but  no physical exam was performed. If it was felt that the patient should be evaluated in the office, they were directed there. The patient verbally consented to this visit. Patient was unable to complete an audio/visual visit due to Technical difficulties and/or Lack of internet. Due to the catastrophic nature of the COVID-19 pandemic, this visit was done through audio contact only.  Location of the patient: home address (see Epic for details)  Location of the provider: office Advanced Informed Consent I sought verbal advanced consent from [name] for virtual visit interactions. I informed [name] of possible security and privacy concerns, risks, and limitations associated with providing "not-in-person" medical evaluation and management services. I also informed Mr. Antonini of the availability of "in-person" appointments. Finally, I informed him that there would be a charge for the virtual visit and that he could be  personally, fully or partially, financially responsible for it. [name] expressed understanding and agreed to proceed.   Historic Elements   Michael Cobb is a 85 y.o. year old, male patient evaluated today after his last encounter by our practice on 05/09/2019. [name]  has a past medical history of BPH (benign prostatic hyperplasia), Cancer ([place]), Chronic renal insufficiency, stage 3 (moderate), Hyperlipidemia, Hypertension, Non-ST elevated myocardial infarction (non-STEMI) ([place]), Syncope, and Type 2 diabetes mellitus ([place]). He also  has a past surgical history that includes Appendectomy; Back surgery; Surgery of lip; and LEFT HEART CATH AND CORONARY ANGIOGRAPHY (N/A, 06/27/2017). [name] has a current medication list which includes the following prescription(s): acetaminophen, aspirin ec, cetirizine, docusate sodium, repatha sureclick, ferrous sulfate, fluticasone, furosemide, gabapentin, glipizide, isosorbide mononitrate, meclizine, omega-3 fatty acids, potassium  chloride sa, ramipril, tamsulosin, cyanocobalamin, amlodipine,  hydrocodone-acetaminophen, and [START ON 06/08/2019] hydrocodone-acetaminophen. He  reports that he has never smoked. He has never used smokeless tobacco. He reports that he does not drink alcohol or use drugs. [name] is allergic to morphine and related.   HPI  He is being evaluated for MM, (2nd pt visit) UDS appropriate. Patient states that he has been dealing with vertigo.  He was started on meclizine since her last visit.  He is not sure if it is helping or not. Patient states that he is fairly stressed out.  He has a dentist appointment and another doctor's appointment that he has to make and has difficulty finding transportation as his daughter is working. Patient is on gabapentin 200 mg nightly.  He states that he was unable to tolerate 100 mg twice daily.  We will continue at this dose. We will also provide a prescription for the patient's hydrocodone as below.   Controlled Substance Pharmacotherapy Assessment REMS (Risk Evaluation and Mitigation Strategy)  Analgesic: 12/31/2018  2   12/26/2018  Hydrocodone-Acetamin 5-325 MG  60.00  [address]   [id number]   [place] ([id number])   0  20.00 MME  Medicare   [place]    Monitoring: [place] PMP: PDMP reviewed during this encounter.       Not applicable at this point since we have not taken over the patient's medication management yet. List of other Serum/Urine Drug Screening Test(s):  No results found for: AMPHSCRSER, BARBSCRSER, BENZOSCRSER, COCAINSCRSER, COCAINSCRNUR, PCPSCRSER, THCSCRSER, THCU, [place], [place], [place], [place], [place] List of all UDS test(s) done:  Lab Results  Component Value Date   SUMMARY Note 04/24/2019   Last UDS on record: Summary  Date Value Ref Range Status  04/24/2019 Note  Final    Comment:    ==================================================================== Compliance Drug Analysis,  Ur ==================================================================== Test                             Result       Flag       Units Drug Present and Declared for Prescription Verification   Acetaminophen                  PRESENT      EXPECTED   Guaifenesin                    PRESENT      EXPECTED    Guaifenesin may be administered as an over-the-counter or    prescription drug; it may also be present as a breakdown product of    methocarbamol. Drug Absent but Declared for Prescription Verification   Gabapentin                     Not Detected UNEXPECTED   Salicylate                     Not Detected UNEXPECTED    Aspirin, as indicated in the declared medication list, is not always    detected even when used as directed.   Dextromethorphan               Not Detected UNEXPECTED ==================================================================== Test                      Result    Flag   Units      Ref Range   Creatinine  75               mg/dL      >=20 ==================================================================== Declared Medications:  The flagging and interpretation on this report are based on the  following declared medications.  Unexpected results may arise from  inaccuracies in the declared medications.  **Note: The testing scope of this panel includes these medications:  Dextromethorphan (Mucinex DM)  Gabapentin  Guaifenesin (Mucinex DM)  **Note: The testing scope of this panel does not include small to  moderate amounts of these reported medications:  Acetaminophen  Aspirin  **Note: The testing scope of this panel does not include the  following reported medications:  Allopurinol  Amlodipine  Cetirizine  Cyanocobalamin  Docusate  Evolocumab  Fluticasone  Furosemide  Glipizide  Iron  Isosorbide (Imdur)  Meclizine  Omega-3 Fatty Acids  Potassium (Klor-Con)  Ramipril   Tamsulosin ==================================================================== For clinical consultation, please call [phone]. ====================================================================    UDS interpretation: No unexpected findings.          Medication Assessment Form: Patient introduced to form today (verbal) Treatment compliance: Treatment may start today if patient agrees with proposed plan. Evaluation of compliance is not applicable at this point Risk Assessment Profile: Aberrant behavior: See initial evaluations. None observed or detected today Comorbid factors increasing risk of overdose: See initial evaluation. No additional risks detected today Opioid risk tool (ORT):  No flowsheet data found.  ORT Scoring interpretation table:  Score <3 = Low Risk for SUD  Score between 4-7 = Moderate Risk for SUD  Score >8 = High Risk for Opioid Abuse   Risk of substance use disorder (SUD): Low  Risk Mitigation Strategies:  Patient opioid safety counseling: Covered. Pt to sign pain contract at next visit. Patient-Prescriber Agreement (PPA): Obtained today. (verbal) Controlled substance notification to other providers: Written and sent today.  Pharmacologic Plan: Today we may be taking over the patient's pharmacological regimen. See below.             Meds   Current Outpatient Medications:  .  acetaminophen (TYLENOL) 500 MG tablet, Take 500 mg by mouth every 6 (six) hours as needed for mild pain or moderate pain., Disp: , Rfl:  .  aspirin EC 81 MG tablet, Take 81 mg by mouth at bedtime. , Disp: , Rfl:  .  cetirizine (ZYRTEC) 10 MG tablet, Take 10 mg by mouth at bedtime. , Disp: , Rfl:  .  docusate sodium (COLACE) 100 MG capsule, Take 100 mg by mouth 2 (two) times daily., Disp: , Rfl:  .  Evolocumab (REPATHA SURECLICK) 935 MG/ML SOAJ, Inject 140 mg into the skin every 14 (fourteen) days., Disp: 6 pen, Rfl: 3 .  ferrous sulfate 325 (65 FE) MG tablet, Take 1 tablet (325 mg  total) by mouth 2 (two) times daily with a meal., Disp: 30 tablet, Rfl: 3 .  fluticasone (FLONASE) 50 MCG/ACT nasal spray, Place 1 spray into both nostrils daily as needed for allergies or rhinitis. , Disp: , Rfl:  .  furosemide (LASIX) 40 MG tablet, Take 1 tablet (40 mg total) by mouth daily., Disp: 30 tablet, Rfl: 2 .  gabapentin (NEURONTIN) 100 MG capsule, Take 1-2 capsules (100-200 mg total) by mouth at bedtime., Disp: 60 capsule, Rfl: 2 .  glipiZIDE (GLUCOTROL) 10 MG tablet, Take 10 mg by mouth 2 (two) times daily before a meal. , Disp: , Rfl: 0 .  isosorbide mononitrate (IMDUR) 30 MG 24 hr tablet, TAKE ONE TABLET BY MOUTH DAILY,  Disp: 90 tablet, Rfl: 2 .  meclizine (ANTIVERT) 25 MG tablet, Take 25 mg by mouth 3 (three) times daily as needed for dizziness., Disp: , Rfl:  .  Omega-3 Fatty Acids (FISH OIL PO), Take 1 tablet by mouth 2 (two) times daily. , Disp: , Rfl:  .  potassium chloride SA (KLOR-CON) 20 MEQ tablet, Take 1 tablet (20 mEq total) by mouth daily., Disp: 90 tablet, Rfl: 3 .  ramipril (ALTACE) 10 MG capsule, Take 10 mg by mouth 2 (two) times daily., Disp: , Rfl: 0 .  tamsulosin (FLOMAX) 0.4 MG CAPS capsule, Take 0.4 mg by mouth at bedtime. , Disp: , Rfl:  .  vitamin B-12 1000 MCG tablet, Take 1 tablet (1,000 mcg total) by mouth daily., Disp: 30 tablet, Rfl: 1 .  amLODipine (NORVASC) 10 MG tablet, Take 1 tablet (10 mg total) by mouth daily., Disp: 30 tablet, Rfl: 6 .  HYDROcodone-acetaminophen (NORCO/VICODIN) 5-325 MG tablet, Take 1 tablet by mouth 2 (two) times daily as needed for severe pain. Must last 30 days., Disp: 60 tablet, Rfl: 0 .  [START ON 06/08/2019] HYDROcodone-acetaminophen (NORCO/VICODIN) 5-325 MG tablet, Take 1 tablet by mouth 2 (two) times daily as needed for severe pain. Must last 30 days., Disp: 60 tablet, Rfl: 0  Laboratory Chemistry Profile   Renal Lab Results  Component Value Date   BUN 31 (H) 04/27/2019   CREATININE 1.66 (H) 04/27/2019   GFRAA 44 (L)  04/27/2019   GFRNONAA 38 (L) 04/27/2019    Electrolytes Lab Results  Component Value Date   NA 137 04/27/2019   K 6.3 (HH) 04/27/2019   CL 111 04/27/2019   CALCIUM 8.9 04/27/2019   MG 1.9 12/31/2018    Hepatic Lab Results  Component Value Date   AST 20 12/29/2018   ALT 18 12/29/2018   ALBUMIN 3.0 (L) 12/29/2018   ALKPHOS 69 12/29/2018    ID Lab Results  Component Value Date   SARSCOV2NAA NEGATIVE 12/28/2018   MRSAPCR NEGATIVE 09/08/2017    Bone No results found for: VD25OH, ZO109UE4VWU, JW1191YN8, GN5621HY8, 25OHVITD1, 25OHVITD2, 25OHVITD3, TESTOFREE, TESTOSTERONE  Endocrine Lab Results  Component Value Date   GLUCOSE 221 (H) 04/27/2019   HGBA1C 7.2 (H) 12/29/2018    Neuropathy Lab Results  Component Value Date   VITAMINB12 119 (L) 12/28/2018   FOLATE 9.1 12/28/2018   HGBA1C 7.2 (H) 12/29/2018    CNS Lab Results  Component Value Date   COLORCSF CLEAR (A) 09/09/2017   APPEARCSF CLEAR (A) 09/09/2017   RBCCOUNTCSF 8 (H) 09/09/2017   WBCCSF 1 09/09/2017    Inflammation (CRP: Acute  ESR: Chronic) Lab Results  Component Value Date   CRP 2.4 (H) 11/20/2018   ESRSEDRATE 72 (H) 11/20/2018   LATICACIDVEN 1.7 09/09/2017    Rheumatology No results found for: RF, ANA, LABURIC, URICUR, LYMEIGGIGMAB, LYMEABIGMQN, HLAB27  Coagulation Lab Results  Component Value Date   INR 1.1 12/28/2018   LABPROT 14.3 12/28/2018   APTT 27 11/20/2018   PLT 201 04/27/2019   DDIMER 1.71 (H) 12/28/2018    Cardiovascular Lab Results  Component Value Date   BNP 229.0 (H) 02/05/2019   CKTOTAL 51 11/20/2018   CKMB 4.5 11/20/2018   HGB 11.0 (L) 04/27/2019   HCT 34.7 (L) 04/27/2019    Screening Lab Results  Component Value Date   SARSCOV2NAA NEGATIVE 12/28/2018   MRSAPCR NEGATIVE 09/08/2017    Cancer No results found for: CEA, CA125, LABCA2  Allergens No results found for: ALMOND, APPLE, ASPARAGUS,  AVOCADO, BANANA, BARLEY, BASIL, BAYLEAF, GREENBEAN, LIMABEAN, WHITEBEAN, BEEFIGE,  REDBEET, BLUEBERRY, BROCCOLI, CABBAGE, MELON, CARROT, CASEIN, CASHEWNUT, CAULIFLOWER, CELERY    Note: Lab results reviewed.   Recent Diagnostic Imaging Review     Lumbosacral Imaging: Lumbar MR wo contrast:  Results for orders placed during the hospital encounter of 04/03/19  MR LUMBAR SPINE WO CONTRAST   Narrative CLINICAL DATA:  Initial evaluation for low back pain radiating into the right lower extremity for 3 years.  EXAM: MRI LUMBAR SPINE WITHOUT CONTRAST  TECHNIQUE: Multiplanar, multisequence MR imaging of the lumbar spine was performed. No intravenous contrast was administered.  COMPARISON:  Previous MRI from 10/02/2017.  FINDINGS: Segmentation: Standard. Lowest well-formed disc space labeled the L5-S1 level.  Alignment: Physiologic with preservation of the normal lumbar lordosis. No listhesis.  Vertebrae: Susceptibility artifact from prior interbody fusion at L4-5. Vertebral body height maintained without evidence for acute or chronic fracture. Bone marrow signal intensity within normal limits. Few scattered benign hemangiomata noted, most prominent of which measures 15 mm at the L3 vertebral body. No worrisome osseous lesions. No abnormal marrow edema.  Conus medullaris and cauda equina: Conus extends to the T12-L1 level. Conus and cauda equina appear normal.  Paraspinal and other soft tissues: Postoperative changes present within the lower posterior paraspinous soft tissues. Paraspinous soft tissues demonstrate no acute abnormality. Few T2 hyperintense simple cyst partially visualize within the right kidney. Visualized visceral structures otherwise unremarkable.  Disc levels:  L1-2:  Negative interspace. Mild facet hypertrophy. No stenosis.  L2-3:  Negative interspace. Mild facet hypertrophy. No stenosis.  L3-4: Disc desiccation with mild diffuse disc bulge. Superimposed small central disc protrusion with associated annular fissure. Moderate facet  degeneration. Resultant moderate canal with moderate to severe bilateral lateral recess stenosis, similar to perhaps mildly progressed from previous. Mild bilateral L3 foraminal narrowing.  L4-5: Prior interbody fusion. Wide posterior decompression. No residual spinal stenosis. Mild to moderate bilateral L4 foraminal narrowing, stable from previous.  L5-S1: Disc desiccation without significant disc bulge. Moderate bilateral facet hypertrophy. No significant canal or lateral recess stenosis. Mild bilateral L5 foraminal narrowing.  IMPRESSION: 1. Prior interbody fusion at L4-5 without residual or recurrent spinal stenosis. 2. Adjacent segment disease with disc bulge and facet degeneration at L3-4 with resultant moderate canal and moderate to severe bilateral lateral recess stenosis, similar to perhaps mildly progressed from previous. 3. Mild to moderate bilateral L3 through L5 foraminal stenosis, similar to previous.   Electronically Signed   By: Jeannine Boga M.D.   On: 04/04/2019 04:21    Lumbar MR wo contrast: No procedure found. Lumbar MR w/wo contrast:  Results for orders placed in visit on 06/22/02  MR Lumbar Spine W Wo Contrast   Narrative FINDINGS CLINICAL DATA:  PATIENT HAS LOW BACK AND LEFT LEG PAIN.  PATIENT HAS UNDERGONE PRIOR FUSION AT L4-5 WITH INTERBODY CAGES. MRI OF THE LUMBAR SPINE, 06/22/02 STUDY IS PERFORMED PRE AND POST IV CONTRAST WITH 20 CC IV CONTRAST UTILIZED. POSTOPERATIVE CHANGES ARE NOTED AT L4-5 WITH INTERBODY CAGES.  POSTOPERATIVE BILATERAL LAMINOTOMY DEFECTS DEMONSTRATED.  THERE IS EXTENSIVE EPIDURAL FIBROSIS.  ECCENTRIC LEFT, THERE IS SOFT TISSUE FOCUS AT THE LEVEL OF THE DISK SPACE.  I QUESTION WHETHER THIS REFLECTS PROMINENT FOCAL SCARRING. THIS MAY RESULT IN ENCROACHMENT UPON THE L5 ROOT CENTRALLY.  THERE IS NO APPRECIABLE ENCROACHMENT UPON THE EXITING L4 ROOTS OR THE L4 NEURAL FORAMINA.  AT L5-S1, MILD FACET DEGENERATIVE CHANGES  ARE SEEN.  DISK DESICCATION WITHOUT FOCAL PROTRUSION.  THERE IS  NO ENCROACHMENT UPON THE NEURAL FORAMINA.  S1 ROOTS ARE NORMAL BILATERALLY. L1-2 AND L2-3:  NORMAL. L3-4:  THERE IS DISK DESICCATION WITH SMALL ANNULAR TEAR.  THERE IS NO CENTRAL OR FORAMINAL STENOSIS. IMPRESSION POST SURGICAL CHANGES ARE SEEN AT L4-5.  SOFT TISSUE FULLNESS IS SEEN AT THE LEVEL OF THE DISK SPACE ECCENTRIC LEFT.  THIS MAY REFLECT FOCAL SCARRING AND MAY POSSIBLY RESULT IN ENCROACHMENT UPON THE L5 ROOT CENTRALLY.  IF THE PATIENT REMAINS SYMPTOMATIC AND FURTHER EVALUATION IS CLINICALLY WARRANTED, CT MYELOGRAPHY MAY BE USEFUL.    Results for orders placed in visit on 11/16/01  DG Lumbar Spine 2-3 Views   Narrative FINDINGS CLINICAL DATA:  LUMBAR PAIN. LUMBAR SPINE THREE VIEWS OF THE LUMBAR SPINE ARE COMPARED TO FILMS OF 09/18/96.  RAY CAGE FUSION APPEARS STABLE AT THE L4-5 LEVEL.  INTERVERTEBRAL DISC SPACES ARE NORMAL AND DIFFUSE DEGENERATIVE CHANGES ARE STABLE. IMPRESSION STABLE RAY CAGE FUSION AT L4-5.  NORMAL ALIGNMENT.         Knee-L DG 4 views:  Results for orders placed in visit on 07/04/02  DG Knee Complete 4 Views Left   Narrative FINDINGS CLINICAL DATA:  LEFT KNEE PAIN FOR THE PAST TWO MONTHS WITH NO RECENT INJURY.  HE INJURED THE KNEE 30 YEARS AGO WITH SOME PAIN SINCE THAT TIME. COMPLETE FOUR VIEW LEFT KNEE FOUR VIEWS DEMONSTRATE MINIMAL MEDIAL SPUR FORMATION AND MINIMAL POSTERIOR PATELLAR SPUR FORMATION.  NO JOINT SPACE NARROWING IS SEEN AND NO EFFUSION IS DEMONSTRATED. IMPRESSION MINIMAL DEGENERATIVE SPUR FORMATION AS DESCRIBED ABOVE.   Complexity Note: Imaging results reviewed. Results shared with Mr. Bohlken, using Layman's terms.                         Assessment  The primary encounter diagnosis was Chronic pain syndrome. Diagnoses of Chronic radicular lumbar pain, Lumbar radiculopathy, Lumbar facet arthropathy, Spinal stenosis, lumbar region, with neurogenic claudication, History of  lumbar fusion, Cervical radicular pain, Cervical facet joint syndrome, S/P cervical spinal fusion, and Pain management contract signed were also pertinent to this visit.  Plan of Care  I have changed Naif M. Lamson's gabapentin. I am also having him start on HYDROcodone-acetaminophen and HYDROcodone-acetaminophen. Additionally, I am having him maintain his glipiZIDE, ramipril, aspirin EC, Omega-3 Fatty Acids (FISH OIL PO), cetirizine, fluticasone, docusate sodium, tamsulosin, acetaminophen, ferrous sulfate, furosemide, cyanocobalamin, potassium chloride SA, amLODipine, Repatha SureClick, meclizine, and isosorbide mononitrate.   Pharmacotherapy (Medications Ordered): Meds ordered this encounter  Medications  . gabapentin (NEURONTIN) 100 MG capsule    Sig: Take 1-2 capsules (100-200 mg total) by mouth at bedtime.    Dispense:  60 capsule    Refill:  2  . HYDROcodone-acetaminophen (NORCO/VICODIN) 5-325 MG tablet    Sig: Take 1 tablet by mouth 2 (two) times daily as needed for severe pain. Must last 30 days.    Dispense:  60 tablet    Refill:  0    Chronic Pain. (STOP Act - Not applicable). Fill one day early if closed on scheduled refill date.  Marland Kitchen HYDROcodone-acetaminophen (NORCO/VICODIN) 5-325 MG tablet    Sig: Take 1 tablet by mouth 2 (two) times daily as needed for severe pain. Must last 30 days.    Dispense:  60 tablet    Refill:  0    Chronic Pain. (STOP Act - Not applicable). Fill one day early if closed on scheduled refill date.    Pharmacological management options:  Opioid Analgesics: We'll take over management today. See  above orders Membrane stabilizer: Currently on a membrane stabilizer Muscle relaxant: Not indicated NSAID: Will not be prescribed. Other analgesic(s): To be determined at a later time    Interventional management options:  Considering:   L3/4 ESI vs Caudal   Total duration of non-face-to-face encounter: 43mnutes.  Follow-up plan:   Return in about 8  weeks (around 07/04/2019) for Medication Management, in person.    Recent Visits Date Type Provider Dept  04/24/19 Office Visit LGillis Santa MD [facility]  Showing recent visits within past 90 days and meeting all other requirements   Today's Visits Date Type Provider Dept  05/09/19 Office Visit LGillis Santa MD [facility]  Showing today's visits and meeting all other requirements   Future Appointments No visits were found meeting these conditions.  Showing future appointments within next 90 days and meeting all other requirements   Primary Care Physician: RLeeanne Rio MD Location: Telephone Virtual Visit Note by: BGillis Santa MD Date: 05/09/2019; Time: 12:08 PM  Note: This dictation was prepared with Dragon dictation. Any transcriptional errors that may result from this process are unintentional.  Disclaimer:  * Given the special circumstances of the COVID-19 pandemic, the federal government has announced that the Office for Civil Rights (OCR) will exercise its enforcement discretion and will not impose penalties on physicians using telehealth in the event of noncompliance with regulatory requirements under the [place](HIPAA) in connection with the good faith provision of telehealth during the CTVIFX-25national public health emergency. ([place]

## 2019-05-20 ENCOUNTER — Ambulatory Visit: Payer: Medicare Other | Admitting: Student in an Organized Health Care Education/Training Program

## 2019-05-25 ENCOUNTER — Other Ambulatory Visit: Payer: Self-pay | Admitting: Cardiovascular Disease

## 2019-07-02 ENCOUNTER — Ambulatory Visit
Payer: Medicare Other | Attending: Student in an Organized Health Care Education/Training Program | Admitting: Student in an Organized Health Care Education/Training Program

## 2019-07-02 ENCOUNTER — Other Ambulatory Visit: Payer: Self-pay

## 2019-07-02 ENCOUNTER — Encounter: Payer: Self-pay | Admitting: Student in an Organized Health Care Education/Training Program

## 2019-07-02 VITALS — BP 155/72 | HR 68 | Temp 98.2°F | Ht 70.0 in | Wt 230.0 lb

## 2019-07-02 DIAGNOSIS — G8929 Other chronic pain: Secondary | ICD-10-CM | POA: Diagnosis present

## 2019-07-02 DIAGNOSIS — G894 Chronic pain syndrome: Secondary | ICD-10-CM | POA: Diagnosis not present

## 2019-07-02 DIAGNOSIS — M5416 Radiculopathy, lumbar region: Secondary | ICD-10-CM | POA: Diagnosis not present

## 2019-07-02 DIAGNOSIS — Z981 Arthrodesis status: Secondary | ICD-10-CM | POA: Diagnosis not present

## 2019-07-02 DIAGNOSIS — M48062 Spinal stenosis, lumbar region with neurogenic claudication: Secondary | ICD-10-CM | POA: Diagnosis not present

## 2019-07-02 MED ORDER — HYDROCODONE-ACETAMINOPHEN 5-325 MG PO TABS: 1.0000 | ORAL_TABLET | Freq: Two times a day (BID) | ORAL | 0 refills | Status: DC | PRN

## 2019-07-02 NOTE — Progress Notes (Signed)
Nursing Pain Medication Assessment:  Safety precautions to be maintained throughout the outpatient stay will include: orient to surroundings, keep bed in low position, maintain call bell within reach at all times, provide assistance with transfer out of bed and ambulation.  Medication Inspection Compliance: Michael Cobb did not comply with our request to bring his pills to be counted. He was reminded that bringing the medication bottles, even when empty, is a requirement.  Medication: None brought in. Pill/Patch Count: None available to be counted. Bottle Appearance: No container available. Did not bring bottle(s) to appointment. Filled Date: N/A Last Medication intake:  Run out two weeks ago

## 2019-07-02 NOTE — Progress Notes (Signed)
PROVIDER NOTE: Information contained herein reflects review and annotations entered in association with encounter. Interpretation of such information and data should be left to medically-trained personnel. Information provided to patient can be located elsewhere in the medical record under "Patient Instructions". Document created using STT-dictation technology, any transcriptional errors that may result from process are unintentional.    Patient: Michael Cobb  Service Category: E/M  Provider: Gillis Santa, MD  DOB: 1935/01/06  DOS: 07/02/2019  Referring Provider: Leeanne Rio, MD  MRN: [id number]  Setting: Ambulatory outpatient  PCP: Leeanne Rio, MD  Type: Established Patient  Specialty: Interventional Pain Management    Location: Office  Delivery: Face-to-face     Primary Reason(s) for Visit: Encounter for prescription drug management. (Level of risk: moderate)  CC: Back Pain  HPI  Michael Cobb is a 85 y.o. year old, male patient, who comes today for a medication management evaluation. He has Abnormal stress test; CAD (coronary artery disease); History of non-ST elevation myocardial infarction (NSTEMI); Leg pain, bilateral; Generalized headaches; DDD (degenerative disc disease), cervical; DM2 (diabetes mellitus, type 2) ([place]); Benign essential HTN; Sepsis ([place]); NSTEMI (non-ST elevated myocardial infarction) ([place]); Chest pain; COVID-19 virus infection; Dyspnea; Acute CHF (congestive heart failure) ([place]); Acute combined systolic and diastolic HF (heart failure) ([place]); Chronic combined systolic and diastolic CHF (congestive heart failure) ([place]); CKD (chronic kidney disease), stage III; NSVT (nonsustained ventricular tachycardia) ([place]); Chronic pain syndrome; Lumbar radiculopathy; Lumbar facet arthropathy; Spinal stenosis, lumbar region, with neurogenic claudication; History of lumbar fusion; Chronic radicular lumbar pain; Cervical facet joint syndrome; S/P cervical spinal fusion; and Pain  management contract signed on their problem list. His primarily concern today is the Back Pain  Pain Assessment: Location:   Back(hip) Radiating: Pain radiaities down both leg to feet Onset: More than a month ago Duration: Chronic pain Quality: Aching, Burning Severity: 5 /10 (subjective, self-reported pain score)  Note: Reported level is compatible with observation.                         When using our objective Pain Scale, levels between 6 and 10/10 are said to belong in an emergency room, as it progressively worsens from a 6/10, described as severely limiting, requiring emergency care not usually available at an outpatient pain management facility. At a 6/10 level, communication becomes difficult and requires great effort. Assistance to reach the emergency department may be required. Facial flushing and profuse sweating along with potentially dangerous increases in heart rate and blood pressure will be evident. Effect on ADL: limits my daily activities Timing: Constant Modifying factors: meds BP: (!) 155/72  HR: 68  Michael Cobb was last scheduled for an appointment on 05/09/2019 for medication management. During today's appointment we reviewed Michael Cobb chronic pain status, as well as his outpatient medication regimen.   Patient states that the pharmacy did not fill his hydrocodone at the full quantity of 60 that was prescribed.  He has been without his medication for the last 2 weeks which has resulted in increased low back pain.  He states that his back pain radiates to his posterior lateral knees.  We discussed a lumbar epidural steroid injection above his fusion hardware for his radicular symptoms.  Risks and benefits reviewed and patient like to proceed.  We will also refill his hydrocodone as below.  The patient  reports no history of drug use. His body mass index is 33 kg/m.  Further details on both, my assessment(s),  as well as the proposed treatment plan, please see  below.  Controlled Substance Pharmacotherapy Assessment REMS (Risk Evaluation and Mitigation Strategy)  Analgesic: Hydrocodone 5 mg twice daily as needed, quantity 60/month, MME equals 10 Michael Fischer, RN  07/02/2019  1:37 PM  Sign when Signing Visit Nursing Pain Medication Assessment:  Safety precautions to be maintained throughout the outpatient stay will include: orient to surroundings, keep bed in low position, maintain call bell within reach at all times, provide assistance with transfer out of bed and ambulation.  Medication Inspection Compliance: Michael Cobb did not comply with our request to bring his pills to be counted. He was reminded that bringing the medication bottles, even when empty, is a requirement.  Medication: None brought in. Pill/Patch Count: None available to be counted. Bottle Appearance: No container available. Did not bring bottle(s) to appointment. Filled Date: N/A Last Medication intake:  Run out two weeks ago   Pharmacokinetics: Liberation and absorption (onset of action): WNL Distribution (time to peak effect): WNL Metabolism and excretion (duration of action): WNL         Pharmacodynamics: Desired effects: Analgesia: Michael Cobb reports >50% benefit. Functional ability: Patient reports that medication allows him to accomplish basic ADLs Clinically meaningful improvement in function (CMIF): Sustained CMIF goals met Perceived effectiveness: Described as relatively effective, allowing for increase in activities of daily living (ADL) Undesirable effects: Side-effects or Adverse reactions: None reported Monitoring: [place] PMP: PDMP not reviewed this encounter. Online review of the past 64-monthperiod conducted. Compliant with practice rules and regulations Last UDS on record: Summary  Date Value Ref Range Status  04/24/2019 Note  Final    Comment:    ==================================================================== Compliance Drug Analysis,  [place] ==================================================================== Test                             Result       Flag       Units Drug Present and Declared for Prescription Verification   Acetaminophen                  PRESENT      EXPECTED   Guaifenesin                    PRESENT      EXPECTED    Guaifenesin may be administered as an over-the-counter or    prescription drug; it may also be present as a breakdown product of    methocarbamol. Drug Absent but Declared for Prescription Verification   Gabapentin                     Not Detected UNEXPECTED   Salicylate                     Not Detected UNEXPECTED    Aspirin, as indicated in the declared medication list, is not always    detected even when used as directed.   Dextromethorphan               Not Detected UNEXPECTED ==================================================================== Test                      Result    Flag   Units      Ref Range   Creatinine              75  mg/dL      >=20 ==================================================================== Declared Medications:  The flagging and interpretation on this report are based on the  following declared medications.  Unexpected results may arise from  inaccuracies in the declared medications.  **Note: The testing scope of this panel includes these medications:  Dextromethorphan (Mucinex DM)  Gabapentin  Guaifenesin (Mucinex DM)  **Note: The testing scope of this panel does not include small to  moderate amounts of these reported medications:  Acetaminophen  Aspirin  **Note: The testing scope of this panel does not include the  following reported medications:  Allopurinol  Amlodipine  Cetirizine  Cyanocobalamin  Docusate  Evolocumab  Fluticasone  Furosemide  Glipizide  Iron  Isosorbide (Imdur)  Meclizine  Omega-3 Fatty Acids  Potassium (Klor-Con)  Ramipril   Tamsulosin ==================================================================== For clinical consultation, please call [phone]. ====================================================================    UDS interpretation: Compliant          Medication Assessment Form: Reviewed. Patient indicates being compliant with therapy Treatment compliance: Compliant Risk Assessment Profile: Aberrant behavior: See initial evaluations. None observed or detected today Comorbid factors increasing risk of overdose: See initial evaluation. No additional risks detected today Opioid risk tool (ORT):  Opioid Risk  07/02/2019  Alcohol 0  Illegal Drugs 0  Rx Drugs 0  Alcohol 0  Illegal Drugs 0  Rx Drugs 0  Age between 16-45 years  0  History of Preadolescent Sexual Abuse 0  Psychological Disease 0  Depression 0  Opioid Risk Tool Scoring 0  Opioid Risk Interpretation Low Risk    ORT Scoring interpretation table:  Score <3 = Low Risk for SUD  Score between 4-7 = Moderate Risk for SUD  Score >8 = High Risk for Opioid Abuse   Risk of substance use disorder (SUD): Low  Risk Mitigation Strategies:  Patient Counseling: Covered Patient-Prescriber Agreement (PPA): Present and active  Notification to other healthcare providers: Done  Pharmacologic Plan: No change in therapy, at this time.             Laboratory Chemistry Profile   Renal Lab Results  Component Value Date   BUN 31 (H) 04/27/2019   CREATININE 1.66 (H) 04/27/2019   GFRAA 44 (L) 04/27/2019   GFRNONAA 38 (L) 04/27/2019     Electrolytes Lab Results  Component Value Date   NA 137 04/27/2019   K 6.3 (HH) 04/27/2019   CL 111 04/27/2019   CALCIUM 8.9 04/27/2019   MG 1.9 12/31/2018     Hepatic Lab Results  Component Value Date   AST 20 12/29/2018   ALT 18 12/29/2018   ALBUMIN 3.0 (L) 12/29/2018   ALKPHOS 69 12/29/2018     ID Lab Results  Component Value Date   [place] NEGATIVE 12/28/2018   MRSAPCR NEGATIVE  09/08/2017     Bone No results found for: VD25OH, UJ811BJ4NWG, NF6213YQ6, VH8469GE9, 25OHVITD1, 25OHVITD2, 25OHVITD3, TESTOFREE, TESTOSTERONE   Endocrine Lab Results  Component Value Date   GLUCOSE 221 (H) 04/27/2019   HGBA1C 7.2 (H) 12/29/2018     Neuropathy Lab Results  Component Value Date   VITAMINB12 119 (L) 12/28/2018   FOLATE 9.1 12/28/2018   HGBA1C 7.2 (H) 12/29/2018     CNS Lab Results  Component Value Date   COLORCSF CLEAR (A) 09/09/2017   APPEARCSF CLEAR (A) 09/09/2017   RBCCOUNTCSF 8 (H) 09/09/2017   WBCCSF 1 09/09/2017     Inflammation (CRP: Acute  ESR: Chronic) Lab Results  Component Value Date   CRP 2.4 (H) 11/20/2018  ESRSEDRATE 72 (H) 11/20/2018   LATICACIDVEN 1.7 09/09/2017     Rheumatology No results found for: RF, ANA, LABURIC, URICUR, LYMEIGGIGMAB, LYMEABIGMQN, HLAB27   Coagulation Lab Results  Component Value Date   INR 1.1 12/28/2018   LABPROT 14.3 12/28/2018   APTT 27 11/20/2018   PLT 201 04/27/2019   DDIMER 1.71 (H) 12/28/2018     Cardiovascular Lab Results  Component Value Date   BNP 229.0 (H) 02/05/2019   CKTOTAL 51 11/20/2018   CKMB 4.5 11/20/2018   HGB 11.0 (L) 04/27/2019   HCT 34.7 (L) 04/27/2019     Screening Lab Results  Component Value Date   SARSCOV2NAA NEGATIVE 12/28/2018   MRSAPCR NEGATIVE 09/08/2017     Cancer No results found for: CEA, CA125, LABCA2   Allergens No results found for: ALMOND, APPLE, ASPARAGUS, AVOCADO, BANANA, BARLEY, BASIL, BAYLE[place], GREENBEAN, LIMABEAN, WHITEBEAN, BEEFIGE, REDBEET, BLUEBERRY, BROCCOLI, CABBAGE, MELON, [place], CASEIN, CASHEWNUT, CAULIFLOWER, CELERY     Note: Lab results reviewed.   Recent Diagnostic Imaging Results  US Venous Img Lower Unilateral Right (DVT) CLINICAL DATA:  Acute right leg pain  EXAM: RIGHT LOWER EXTREMITY VENOUS DOPPLER ULTRASOUND  TECHNIQUE: Gray-scale sonography with graded compression, as well as color Doppler and duplex ultrasound were  performed to evaluate the lower extremity deep venous systems from the level of the common femoral vein and including the common femoral, femoral, profunda femoral, popliteal and calf veins including the posterior tibial, peroneal and gastrocnemius veins when visible. The superficial great saphenous vein was also interrogated. Spectral Doppler was utilized to evaluate flow at rest and with distal augmentation maneuvers in the common femoral, femoral and popliteal veins.  COMPARISON:  None.  FINDINGS: Contralateral Common Femoral Vein: Respiratory phasicity is normal and symmetric with the symptomatic side. No evidence of thrombus. Normal compressibility.  Common Femoral Vein: No evidence of thrombus. Normal compressibility, respiratory phasicity and response to augmentation.  Saphenofemoral Junction: No evidence of thrombus. Normal compressibility and flow on color Doppler imaging.  Profunda Femoral Vein: No evidence of thrombus. Normal compressibility and flow on color Doppler imaging.  Femoral Vein: No evidence of thrombus. Normal compressibility, respiratory phasicity and response to augmentation.  Popliteal Vein: No evidence of thrombus. Normal compressibility, respiratory phasicity and response to augmentation.  Calf Veins: No evidence of thrombus. Normal compressibility and flow on color Doppler imaging.  IMPRESSION: No evidence of deep venous thrombosis.  Electronically Signed   By: Jerilynn Mages.  Shick M.D.   On: 04/28/2019 09:58  Complexity Note: Imaging results reviewed. Results shared with Mr. Gantt, using Layman's terms.                               Meds   Current Outpatient Medications:  .  acetaminophen (TYLENOL) 500 MG tablet, Take 500 mg by mouth every 6 (six) hours as needed for mild pain or moderate pain., Disp: , Rfl:  .  amLODipine (NORVASC) 10 MG tablet, TAKE ONE TABLET BY MOUTH DAILY, Disp: 90 tablet, Rfl: 1 .  aspirin EC 81 MG tablet, Take 81 mg by mouth  at bedtime. , Disp: , Rfl:  .  cetirizine (ZYRTEC) 10 MG tablet, Take 10 mg by mouth at bedtime. , Disp: , Rfl:  .  docusate sodium (COLACE) 100 MG capsule, Take 100 mg by mouth 2 (two) times daily., Disp: , Rfl:  .  Evolocumab (REPATHA SURECLICK) 063 MG/ML SOAJ, Inject 140 mg into the skin every 14 (fourteen) days.,  Disp: 6 pen, Rfl: 3 .  ferrous sulfate 325 (65 FE) MG tablet, Take 1 tablet (325 mg total) by mouth 2 (two) times daily with a meal., Disp: 30 tablet, Rfl: 3 .  fluticasone (FLONASE) 50 MCG/ACT nasal spray, Place 1 spray into both nostrils daily as needed for allergies or rhinitis. , Disp: , Rfl:  .  furosemide (LASIX) 40 MG tablet, Take 1 tablet (40 mg total) by mouth daily., Disp: 30 tablet, Rfl: 2 .  gabapentin (NEURONTIN) 100 MG capsule, Take 1-2 capsules (100-200 mg total) by mouth at bedtime., Disp: 60 capsule, Rfl: 2 .  glipiZIDE (GLUCOTROL) 10 MG tablet, Take 10 mg by mouth 2 (two) times daily before a meal. , Disp: , Rfl: 0 .  HYDROcodone-acetaminophen (NORCO/VICODIN) 5-325 MG tablet, Take 1 tablet by mouth 2 (two) times daily as needed for severe pain. Must last 30 days., Disp: 60 tablet, Rfl: 0 .  [START ON 08/01/2019] HYDROcodone-acetaminophen (NORCO/VICODIN) 5-325 MG tablet, Take 1 tablet by mouth 2 (two) times daily as needed for severe pain. Must last 30 days., Disp: 60 tablet, Rfl: 0 .  isosorbide mononitrate (IMDUR) 30 MG 24 hr tablet, TAKE ONE TABLET BY MOUTH DAILY, Disp: 90 tablet, Rfl: 2 .  meclizine (ANTIVERT) 25 MG tablet, Take 25 mg by mouth 3 (three) times daily as needed for dizziness., Disp: , Rfl:  .  Omega-3 Fatty Acids (FISH OIL PO), Take 1 tablet by mouth 2 (two) times daily. , Disp: , Rfl:  .  potassium chloride SA (KLOR-CON) 20 MEQ tablet, Take 1 tablet (20 mEq total) by mouth daily., Disp: 90 tablet, Rfl: 3 .  ramipril (ALTACE) 10 MG capsule, Take 10 mg by mouth 2 (two) times daily., Disp: , Rfl: 0 .  tamsulosin (FLOMAX) 0.4 MG CAPS capsule, Take 0.4 mg by  mouth at bedtime. , Disp: , Rfl:  .  vitamin B-12 1000 MCG tablet, Take 1 tablet (1,000 mcg total) by mouth daily., Disp: 30 tablet, Rfl: 1  ROS  Constitutional: Denies any fever or chills Gastrointestinal: No reported hemesis, hematochezia, vomiting, or acute GI distress Musculoskeletal: Denies any acute onset joint swelling, redness, loss of ROM, or weakness Neurological: No reported episodes of acute onset apraxia, aphasia, dysarthria, agnosia, amnesia, paralysis, loss of coordination, or loss of consciousness  Allergies  Mr. Roylance is allergic to morphine and related.  [place]  Drug: Mr. Borsuk  reports no history of drug use. Alcohol:  reports no history of alcohol use. Tobacco:  reports that he has never smoked. He has never used smokeless tobacco. Medical:  has a past medical history of BPH (benign prostatic hyperplasia), Cancer ([place]), Chronic renal insufficiency, stage 3 (moderate), Hyperlipidemia, Hypertension, Non-ST elevated myocardial infarction (non-STEMI) ([place]), Syncope, and Type 2 diabetes mellitus ([place]). Surgical: Mr. Dresch  has a past surgical history that includes Appendectomy; Back surgery; Surgery of lip; and LEFT HEART CATH AND CORONARY ANGIOGRAPHY (N/A, 06/27/2017). Family: family history includes CVA in his father; Hypertension in his father.  Constitutional Exam  General appearance: Well nourished, well developed, and well hydrated. In no apparent acute distress Vitals:   07/02/19 1339  BP: (!) 155/72  Pulse: 68  Temp: 98.2 F (36.8 C)  SpO2: 99%  Weight: 230 lb (104.3 kg)  Height: _0  (1.778 m)   BMI Assessment: Estimated body mass index is 33 kg/m as calculated from the following:   Height as of this encounter: _1  (1.778 m).   Weight as of this encounter: 230 lb (  104.3 kg).  BMI interpretation table: BMI level Category Range association with higher incidence of chronic pain  <18 kg/m2 Underweight   18.5-24.9 kg/m2 Ideal body weight   25-29.9  kg/m2 Overweight Increased incidence by 20%  30-34.9 kg/m2 Obese (Class I) Increased incidence by 68%  35-39.9 kg/m2 Severe obesity (Class II) Increased incidence by 136%  >40 kg/m2 Extreme obesity (Class III) Increased incidence by 254%   Patient's current BMI Ideal Body weight  Body mass index is 33 kg/m. Ideal body weight: 73 kg (160 lb 15 oz) Adjusted ideal body weight: 85.5 kg (188 lb 9 oz)   BMI Readings from Last 4 Encounters:  07/02/19 33.00 kg/m  04/27/19 32.36 kg/m  04/24/19 32.36 kg/m  03/22/19 33.69 kg/m   Wt Readings from Last 4 Encounters:  07/02/19 230 lb (104.3 kg)  04/27/19 232 lb (105.2 kg)  04/24/19 232 lb (105.2 kg)  03/22/19 234 lb 12.8 oz (106.5 kg)    Psych/Mental status: Alert, oriented x 3 (person, place, & time)       Eyes: PERLA Respiratory: No evidence of acute respiratory distress  Cervical Spine Exam  Skin & Axial Inspection: Well healed scar from previous spine surgery detected Alignment: Symmetrical Functional ROM: Pain restricted ROM      Stability: No instability detected Muscle Tone/Strength: Functionally intact. No obvious neuro-muscular anomalies detected. Sensory (Neurological): Dermatomal pain pattern Palpation: No palpable anomalies              Upper Extremity (UE) Exam    Side: Right upper extremity  Side: Left upper extremity  Skin & Extremity Inspection: Skin color, temperature, and hair growth are WNL. No peripheral edema or cyanosis. No masses, redness, swelling, asymmetry, or associated skin lesions. No contractures.  Skin & Extremity Inspection: Skin color, temperature, and hair growth are WNL. No peripheral edema or cyanosis. No masses, redness, swelling, asymmetry, or associated skin lesions. No contractures.  Functional ROM: Unrestricted ROM          Functional ROM: Unrestricted ROM          Muscle Tone/Strength: Functionally intact. No obvious neuro-muscular anomalies detected.  Muscle Tone/Strength: Functionally intact.  No obvious neuro-muscular anomalies detected.  Sensory (Neurological): Unimpaired          Sensory (Neurological): Unimpaired          Palpation: No palpable anomalies              Palpation: No palpable anomalies              Provocative Test(s):  Phalen's test: deferred Tinel's test: deferred Apley's scratch test (touch opposite shoulder):  Action 1 (Across chest): deferred Action 2 (Overhead): deferred Action 3 (LB reach): deferred   Provocative Test(s):  Phalen's test: deferred Tinel's test: deferred Apley's scratch test (touch opposite shoulder):  Action 1 (Across chest): deferred Action 2 (Overhead): deferred Action 3 (LB reach): deferred    Thoracic Spine Area Exam  Skin & Axial Inspection: No masses, redness, or swelling Alignment: Symmetrical Functional ROM: Unrestricted ROM Stability: No instability detected Muscle Tone/Strength: Functionally intact. No obvious neuro-muscular anomalies detected. Sensory (Neurological): Unimpaired Muscle strength & Tone: No palpable anomalies  Lumbar Exam  Skin & Axial Inspection: Well healed scar from previous spine surgery detected Alignment: Symmetrical Functional ROM: Pain restricted ROM affecting both sides Stability: No instability detected Muscle Tone/Strength: Functionally intact. No obvious neuro-muscular anomalies detected. Sensory (Neurological): Dermatomal pain pattern Palpation: No palpable anomalies       Provocative Tests: Hyperextension/rotation test:  deferred today       Lumbar quadrant test (Kemp's test): (+) bilateral for foraminal stenosis Lateral bending test: (+) ipsilateral radicular pain, bilaterally. Positive for bilateral foraminal stenosis. Patrick's Maneuver: deferred today                   FABER* test: deferred today                   S-I anterior distraction/compression test: deferred today         S-I lateral compression test: deferred today         S-I Thigh-thrust test: deferred today         S-I  Gaenslen's test: deferred today         *(Flexion, ABduction and External Rotation)  Gait & Posture Assessment  Ambulation: Patient ambulates using a cane Gait: Antalgic Posture: Difficulty standing up straight, due to pain   Lower Extremity Exam    Side: Right lower extremity  Side: Left lower extremity  Stability: No instability observed          Stability: No instability observed          Skin & Extremity Inspection: Skin color, temperature, and hair growth are WNL. No peripheral edema or cyanosis. No masses, redness, swelling, asymmetry, or associated skin lesions. No contractures.  Skin & Extremity Inspection: Skin color, temperature, and hair growth are WNL. No peripheral edema or cyanosis. No masses, redness, swelling, asymmetry, or associated skin lesions. No contractures.  Functional ROM: Pain restricted ROM for all joints of the lower extremity          Functional ROM: Pain restricted ROM for all joints of the lower extremity          Muscle Tone/Strength: Functionally intact. No obvious neuro-muscular anomalies detected.  Muscle Tone/Strength: Functionally intact. No obvious neuro-muscular anomalies detected.  Sensory (Neurological): Dermatomal pain pattern        Sensory (Neurological): Dermatomal pain pattern        DTR: Patellar: deferred today Achilles: deferred today Plantar: deferred today  DTR: Patellar: deferred today Achilles: deferred today Plantar: deferred today  Palpation: No palpable anomalies  Palpation: No palpable anomalies   Assessment   Status Diagnosis  Persistent Persistent Persistent 1. Chronic radicular lumbar pain   2. Lumbar radiculopathy   3. Spinal stenosis, lumbar region, with neurogenic claudication   4. History of lumbar fusion   5. Chronic pain syndrome      Updated Problems: Problem  Chronic Radicular Lumbar Pain    1. Chronic radicular lumbar pain - HYDROcodone-acetaminophen (NORCO/VICODIN) 5-325 MG tablet; Take 1 tablet by mouth  2 (two) times daily as needed for severe pain. Must last 30 days.  Dispense: 60 tablet; Refill: 0 - HYDROcodone-acetaminophen (NORCO/VICODIN) 5-325 MG tablet; Take 1 tablet by mouth 2 (two) times daily as needed for severe pain. Must last 30 days.  Dispense: 60 tablet; Refill: 0 - Lumbar Epidural Injection; Future  2. Lumbar radiculopathy - HYDROcodone-acetaminophen (NORCO/VICODIN) 5-325 MG tablet; Take 1 tablet by mouth 2 (two) times daily as needed for severe pain. Must last 30 days.  Dispense: 60 tablet; Refill: 0 - HYDROcodone-acetaminophen (NORCO/VICODIN) 5-325 MG tablet; Take 1 tablet by mouth 2 (two) times daily as needed for severe pain. Must last 30 days.  Dispense: 60 tablet; Refill: 0 - Lumbar Epidural Injection; Future  3. Spinal stenosis, lumbar region, with neurogenic claudication -Lumbar ESI as above  4. History of lumbar fusion -Stable  5. Chronic pain  syndrome -Continue Tylenol 500 mg every 6 hours as needed, gabapentin 100 to 200 mg nightly. - HYDROcodone-acetaminophen (NORCO/VICODIN) 5-325 MG tablet; Take 1 tablet by mouth 2 (two) times daily as needed for severe pain. Must last 30 days.  Dispense: 60 tablet; Refill: 0 - HYDROcodone-acetaminophen (NORCO/VICODIN) 5-325 MG tablet; Take 1 tablet by mouth 2 (two) times daily as needed for severe pain. Must last 30 days.  Dispense: 60 tablet; Refill: 0   Plan of Care  Pharmacotherapy (Medications Ordered): Meds ordered this encounter  Medications  . HYDROcodone-acetaminophen (NORCO/VICODIN) 5-325 MG tablet    Sig: Take 1 tablet by mouth 2 (two) times daily as needed for severe pain. Must last 30 days.    Dispense:  60 tablet    Refill:  0    Chronic Pain. (STOP Act - Not applicable). Fill one day early if closed on scheduled refill date.  Marland Kitchen HYDROcodone-acetaminophen (NORCO/VICODIN) 5-325 MG tablet    Sig: Take 1 tablet by mouth 2 (two) times daily as needed for severe pain. Must last 30 days.    Dispense:  60 tablet     Refill:  0    Chronic Pain. (STOP Act - Not applicable). Fill one day early if closed on scheduled refill date.    Orders:  Orders Placed This Encounter  Procedures  . Lumbar Epidural Injection    Standing Status:   Future    Standing Expiration Date:   08/01/2019    Scheduling Instructions:     Procedure: Interlaminar Lumbar Epidural Steroid injection (LESI)            Laterality: Midline L3/4     Sedation: with     Timeframe: ASAA    Order Specific Question:   Where will this procedure be performed?    Answer:   [facility]   Planned follow-up:   Return in about 1 week (around 07/09/2019) for L3-4 ESI sedation.  2 months medication management     Interventional management options:  Considering:   L3/4 ESI vs Caudal   Total duration of non-face-to-face encounter: 2mnutes.  Recent Visits Date Type Provider Dept  05/09/19 Office Visit LGillis Santa MD [facility]  04/24/19 Office Visit LGillis Santa MD [facility]  Showing recent visits within past 90 days and meeting all other requirements   Today's Visits Date Type Provider Dept  07/02/19 Office Visit LGillis Santa MD [facility]  Showing today's visits and meeting all other requirements   Future Appointments No visits were found meeting these conditions.  Showing future appointments within next 90 days and meeting all other requirements   Primary Care Physician: RLeeanne Rio MD [facility] Note by: BGillis Santa MD Date: 07/02/2019; Time: 2:13 PM  Note: This dictation was prepared with Dragon dictation. Any transcriptional errors that may result from this process are unintentional.

## 2019-08-20 ENCOUNTER — Telehealth: Payer: Self-pay | Admitting: Cardiovascular Disease

## 2019-08-20 NOTE — Telephone Encounter (Signed)
Patient called and said he got a bill for $20 with a date of service from 03/22/19. The patient said he paid his Copay at the time of his visit and he is not sure why he got a bill from the office. He tried to call the company that issued the bill but he states the company hung up on him 3x. He would just like more information from someone at Dr. Court Joy office about this bill

## 2019-08-21 NOTE — Telephone Encounter (Signed)
Patient came into the [facility] office stating the same thing from the phone message, I informed patient that he would need to contact billing to see where the charge came from and if he could pay for it w/ them. Gave pt billing phone number

## 2019-08-26 ENCOUNTER — Other Ambulatory Visit: Payer: Self-pay

## 2019-08-26 ENCOUNTER — Ambulatory Visit
Admission: RE | Admit: 2019-08-26 | Discharge: 2019-08-26 | Disposition: A | Discharge status: Home / Self Care | Payer: Medicare Other | Source: Ambulatory Visit | Attending: Student in an Organized Health Care Education/Training Program | Admitting: Student in an Organized Health Care Education/Training Program

## 2019-08-26 ENCOUNTER — Encounter: Payer: Self-pay | Admitting: Student in an Organized Health Care Education/Training Program

## 2019-08-26 ENCOUNTER — Ambulatory Visit (HOSPITAL_BASED_OUTPATIENT_CLINIC_OR_DEPARTMENT_OTHER): Payer: Medicare Other | Admitting: Student in an Organized Health Care Education/Training Program

## 2019-08-26 VITALS — BP 134/57 | HR 68 | Temp 98.0°F | Resp 16 | Ht 70.0 in | Wt 236.0 lb

## 2019-08-26 DIAGNOSIS — G894 Chronic pain syndrome: Secondary | ICD-10-CM | POA: Insufficient documentation

## 2019-08-26 DIAGNOSIS — G8929 Other chronic pain: Secondary | ICD-10-CM

## 2019-08-26 DIAGNOSIS — M5416 Radiculopathy, lumbar region: Secondary | ICD-10-CM | POA: Diagnosis not present

## 2019-08-26 MED ORDER — HYDROCODONE-ACETAMINOPHEN 5-325 MG PO TABS: 1.0000 | ORAL_TABLET | Freq: Two times a day (BID) | ORAL | 0 refills | Status: DC | PRN

## 2019-08-26 MED ORDER — IOHEXOL 180 MG/ML  SOLN
10.0000 mL | Freq: Once | INTRAMUSCULAR | Status: AC
  Administered 2019-08-26: 10 mL via EPIDURAL

## 2019-08-26 MED ORDER — DEXAMETHASONE SODIUM PHOSPHATE 10 MG/ML IJ SOLN
10.0000 mg | Freq: Once | INTRAMUSCULAR | Status: AC
  Administered 2019-08-26: 10 mg

## 2019-08-26 MED ORDER — LIDOCAINE HCL 2 % IJ SOLN
INTRAMUSCULAR | Status: AC
  Filled 2019-08-26: qty 20

## 2019-08-26 MED ORDER — FENTANYL CITRATE (PF) 100 MCG/2ML IJ SOLN
INTRAMUSCULAR | Status: AC
  Filled 2019-08-26: qty 2

## 2019-08-26 MED ORDER — LIDOCAINE HCL 2 % IJ SOLN
20.0000 mL | Freq: Once | INTRAMUSCULAR | Status: AC
  Administered 2019-08-26: 400 mg

## 2019-08-26 MED ORDER — ROPIVACAINE HCL 2 MG/ML IJ SOLN
2.0000 mL | Freq: Once | INTRAMUSCULAR | Status: AC
  Administered 2019-08-26: 2 mL via EPIDURAL

## 2019-08-26 MED ORDER — SODIUM CHLORIDE 0.9% FLUSH
2.0000 mL | Freq: Once | INTRAVENOUS | Status: AC
  Administered 2019-08-26: 2 mL

## 2019-08-26 MED ORDER — DEXAMETHASONE SODIUM PHOSPHATE 10 MG/ML IJ SOLN
INTRAMUSCULAR | Status: AC
  Filled 2019-08-26: qty 1

## 2019-08-26 MED ORDER — FENTANYL CITRATE (PF) 100 MCG/2ML IJ SOLN
25.0000 ug | INTRAMUSCULAR | Status: AC | PRN
  Administered 2019-08-26: 25 ug via INTRAVENOUS
  Administered 2019-08-26: 50 ug via INTRAVENOUS

## 2019-08-26 MED ORDER — ROPIVACAINE HCL 2 MG/ML IJ SOLN
INTRAMUSCULAR | Status: AC
  Filled 2019-08-26: qty 10

## 2019-08-26 MED ORDER — SODIUM CHLORIDE (PF) 0.9 % IJ SOLN
INTRAMUSCULAR | Status: AC
  Filled 2019-08-26: qty 10

## 2019-08-26 NOTE — Progress Notes (Signed)
PROVIDER NOTE: Information contained herein reflects review and annotations entered in association with encounter. Interpretation of such information and data should be left to medically-trained personnel. Information provided to patient can be located elsewhere in the medical record under "Patient Instructions". Document created using STT-dictation technology, any transcriptional errors that may result from process are unintentional.    Patient: Michael Cobb  Service Category: Procedure  Provider: Gillis Santa, MD  DOB: Jun 13, 1934  DOS: 08/26/2019  Location: [facility]  MRN: [id number]  Setting: Ambulatory - outpatient  Referring Provider: Leeanne Rio, MD  Type: Established Patient  Specialty: Interventional Pain Management  PCP: Leeanne Rio, MD   Primary Reason for Visit: Interventional Pain Management Treatment. CC: Back Pain (lower)  Procedure:          Anesthesia, Analgesia, Anxiolysis:  Type: Diagnostic Inter-Laminar Epidural Steroid Injection  #1  Region: Lumbar Level: L2-3 Level. (with Tuohy bevel down) Laterality: Midline         Type: Moderate (Conscious) Sedation combined with Local Anesthesia Indication(s): Analgesia and Anxiety Route: Intravenous (IV) IV Access: Secured Sedation: Meaningful verbal contact was maintained at all times during the procedure  Local Anesthetic: Lidocaine 1-2%  Position: Prone with head of the table was raised to facilitate breathing.   Indications: 1. Chronic radicular lumbar pain   2. Lumbar radiculopathy   3. Chronic pain syndrome    Pain Score: Pre-procedure: 6 /10 Post-procedure: 0-No pain/10   Pre-op Assessment:  Michael Cobb is a 85 y.o. (year old), male patient, seen today for interventional treatment. He  has a past surgical history that includes Appendectomy; Back surgery; Surgery of lip; and LEFT HEART CATH AND CORONARY ANGIOGRAPHY (N/A, 06/27/2017). Michael Cobb has a current medication list which includes  the following prescription(s): acetaminophen, amlodipine, aspirin ec, cetirizine, cyanocobalamin, docusate sodium, empagliflozin, repatha sureclick, ferrous sulfate, fluticasone, furosemide, glipizide, [START ON 08/31/2019] hydrocodone-acetaminophen, [START ON 09/30/2019] hydrocodone-acetaminophen, [START ON 10/30/2019] hydrocodone-acetaminophen, isosorbide mononitrate, meclizine, omega-3 fatty acids, potassium chloride sa, ramipril, tamsulosin, gabapentin, and cyanocobalamin. His primarily concern today is the Back Pain (lower)  Initial Vital Signs:  Pulse/HCG Rate: (!) 58ECG Heart Rate: 64 Temp: (!) 97.4 F (36.3 C) Resp: 17 BP: (!) 144/65 SpO2: 99 %  BMI: Estimated body mass index is 33.86 kg/m as calculated from the following:   Height as of this encounter: 5\' 10"  (1.778 m).   Weight as of this encounter: 236 lb (107 kg).  Risk Assessment: Allergies: Reviewed. He is allergic to morphine and related.  Allergy Precautions: None required Coagulopathies: Reviewed. None identified.  Blood-thinner therapy: None at this time Active Infection(s): Reviewed. None identified. Michael Cobb is afebrile  Site Confirmation: Michael Cobb was asked to confirm the procedure and laterality before marking the site Procedure checklist: Completed Consent: Before the procedure and under the influence of no sedative(s), amnesic(s), or anxiolytics, the patient was informed of the treatment options, risks and possible complications. To fulfill our ethical and legal obligations, as recommended by the American Medical Association's Code of Ethics, I have informed the patient of my clinical impression; the nature and purpose of the treatment or procedure; the risks, benefits, and possible complications of the intervention; the alternatives, including doing nothing; the risk(s) and benefit(s) of the alternative treatment(s) or procedure(s); and the risk(s) and benefit(s) of doing nothing. The patient was provided  information about the general risks and possible complications associated with the procedure. These may include, but are not limited to: failure to achieve desired goals, infection,  bleeding, organ or nerve damage, allergic reactions, paralysis, and death. In addition, the patient was informed of those risks and complications associated to Spine-related procedures, such as failure to decrease pain; infection (i.e.: Meningitis, epidural or intraspinal abscess); bleeding (i.e.: epidural hematoma, subarachnoid hemorrhage, or any other type of intraspinal or peri-dural bleeding); organ or nerve damage (i.e.: Any type of peripheral nerve, nerve root, or spinal cord injury) with subsequent damage to sensory, motor, and/or autonomic systems, resulting in permanent pain, numbness, and/or weakness of one or several areas of the body; allergic reactions; (i.e.: anaphylactic reaction); and/or death. Furthermore, the patient was informed of those risks and complications associated with the medications. These include, but are not limited to: allergic reactions (i.e.: anaphylactic or anaphylactoid reaction(s)); adrenal axis suppression; blood sugar elevation that in diabetics may result in ketoacidosis or comma; water retention that in patients with history of congestive heart failure may result in shortness of breath, pulmonary edema, and decompensation with resultant heart failure; weight gain; swelling or edema; medication-induced neural toxicity; particulate matter embolism and blood vessel occlusion with resultant organ, and/or nervous system infarction; and/or aseptic necrosis of one or more joints. Finally, the patient was informed that Medicine is not an exact science; therefore, there is also the possibility of unforeseen or unpredictable risks and/or possible complications that may result in a catastrophic outcome. The patient indicated having understood very clearly. We have given the patient no guarantees and we  have made no promises. Enough time was given to the patient to ask questions, all of which were answered to the patient's satisfaction. Michael Cobb has indicated that he wanted to continue with the procedure. Attestation: I, the ordering provider, attest that I have discussed with the patient the benefits, risks, side-effects, alternatives, likelihood of achieving goals, and potential problems during recovery for the procedure that I have provided informed consent. Date  Time: 08/26/2019  9:41 AM  Pre-Procedure Preparation:  Monitoring: As per clinic protocol. Respiration, ETCO2, SpO2, BP, heart rate and rhythm monitor placed and checked for adequate function Safety Precautions: Patient was assessed for positional comfort and pressure points before starting the procedure. Time-out: I initiated and conducted the "Time-out" before starting the procedure, as per protocol. The patient was asked to participate by confirming the accuracy of the "Time Out" information. Verification of the correct person, site, and procedure were performed and confirmed by me, the nursing staff, and the patient. "Time-out" conducted as per Joint Commission's Universal Protocol (UP.01.01.01). Time: 1049  Description of Procedure:          Target Area: The interlaminar space, initially targeting the lower laminar border of the superior vertebral body. Approach: Paramedial approach. Area Prepped: Entire Posterior Lumbar Region DuraPrep (Iodine Povacrylex [0.7% available iodine] and Isopropyl Alcohol, 74% w/w) Safety Precautions: Aspiration looking for blood return was conducted prior to all injections. At no point did we inject any substances, as a needle was being advanced. No attempts were made at seeking any paresthesias. Safe injection practices and needle disposal techniques used. Medications properly checked for expiration dates. SDV (single dose vial) medications used. Description of the Procedure: Protocol guidelines  were followed. The procedure needle was introduced through the skin, ipsilateral to the reported pain, and advanced to the target area. Bone was contacted and the needle walked caudad, until the lamina was cleared. The epidural space was identified using "loss-of-resistance technique" with 2-3 ml of PF-NaCl (0.9% NSS), in a 5cc LOR glass syringe.  Vitals:   08/26/19 1058 08/26/19 1106 08/26/19  1116 08/26/19 1126  BP: (!) 161/69 (!) 149/66 (!) 132/55 (!) 134/57  Pulse: 68     Resp: (!) 21 14 16 16   Temp:  97.8 F (36.6 C)  98 F (36.7 C)  TempSrc:      SpO2: 100% 100% 98% 98%  Weight:      Height:        Start Time: 1049 hrs. End Time:   hrs.  Materials:  Needle(s) Type: Epidural needle Gauge: 22G Length: 3.5-in Medication(s): Please see orders for medications and dosing details. 7 cc solution made of 4 cc of preservative-free saline, 2 cc of 0.2% ropivacaine, 1 cc of Decadron 10 mg/cc.  Imaging Guidance (Spinal):          Type of Imaging Technique: Fluoroscopy Guidance (Spinal) Indication(s): Assistance in needle guidance and placement for procedures requiring needle placement in or near specific anatomical locations not easily accessible without such assistance. Exposure Time: Please see nurses notes. Contrast: Before injecting any contrast, we confirmed that the patient did not have an allergy to iodine, shellfish, or radiological contrast. Once satisfactory needle placement was completed at the desired level, radiological contrast was injected. Contrast injected under live fluoroscopy. No contrast complications. See chart for type and volume of contrast used. Fluoroscopic Guidance: I was personally present during the use of fluoroscopy. "Tunnel Vision Technique" used to obtain the best possible view of the target area. Parallax error corrected before commencing the procedure. "Direction-depth-direction" technique used to introduce the needle under continuous pulsed fluoroscopy. Once  target was reached, antero-posterior, oblique, and lateral fluoroscopic projection used confirm needle placement in all planes. Images permanently stored in EMR. Interpretation: I personally interpreted the imaging intraoperatively. Adequate needle placement confirmed in multiple planes. Appropriate spread of contrast into desired area was observed. No evidence of afferent or efferent intravascular uptake. No intrathecal or subarachnoid spread observed. Permanent images saved into the patient's record.  Antibiotic Prophylaxis:   Anti-infectives (From admission, onward)   None     Indication(s): None identified  Post-operative Assessment:  Post-procedure Vital Signs:  Pulse/HCG Rate: 68(!) 57 Temp: 98 F (36.7 C) Resp: 16 BP: (!) 134/57 SpO2: 98 %  EBL: None  Complications: No immediate post-treatment complications observed by team, or reported by patient.  Note: The patient tolerated the entire procedure well. A repeat set of vitals were taken after the procedure and the patient was kept under observation following institutional policy, for this type of procedure. Post-procedural neurological assessment was performed, showing return to baseline, prior to discharge. The patient was provided with post-procedure discharge instructions, including a section on how to identify potential problems. Should any problems arise concerning this procedure, the patient was given instructions to immediately contact us, at any time, without hesitation. In any case, we plan to contact the patient by telephone for a follow-up status report regarding this interventional procedure.  Comments:  No additional relevant information.  Plan of Care  Orders:  Orders Placed This Encounter  Procedures  . DG PAIN [facility] C-ARM 1-60 MIN NO REPORT    Intraoperative interpretation by procedural physician at [place].    Standing Status:   Standing    Number of Occurrences:   1    Order Specific  Question:   Reason for exam:    Answer:   Assistance in needle guidance and placement for procedures requiring needle placement in or near specific anatomical locations not easily accessible without such assistance.      Pharmacotherapy Assessment  Analgesic:  Chronic Opioid Analgesic: Norco 5 mg BID prn, #60/month  Monitoring: [place] PMP: PDMP reviewed during this encounter.       Pharmacotherapy: No side-effects or adverse reactions reported. Compliance: No problems identified. Effectiveness: Clinically acceptable.  UDS:  Summary  Date Value Ref Range Status  04/24/2019 Note  Final    Comment:    ==================================================================== Compliance Drug Analysis, Ur ==================================================================== Test                             Result       Flag       Units Drug Present and Declared for Prescription Verification   Acetaminophen                  PRESENT      EXPECTED   Guaifenesin                    PRESENT      EXPECTED    Guaifenesin may be administered as an over-the-counter or    prescription drug; it may also be present as a breakdown product of    methocarbamol. Drug Absent but Declared for Prescription Verification   Gabapentin                     Not Detected UNEXPECTED   Salicylate                     Not Detected UNEXPECTED    Aspirin, as indicated in the declared medication list, is not always    detected even when used as directed.   Dextromethorphan               Not Detected UNEXPECTED ==================================================================== Test                      Result    Flag   Units      Ref Range   Creatinine              75               mg/dL      >=20 ==================================================================== Declared Medications:  The flagging and interpretation on this report are based on the  following declared medications.  Unexpected results may arise from   inaccuracies in the declared medications.  **Note: The testing scope of this panel includes these medications:  Dextromethorphan (Mucinex DM)  Gabapentin  Guaifenesin (Mucinex DM)  **Note: The testing scope of this panel does not include small to  moderate amounts of these reported medications:  Acetaminophen  Aspirin  **Note: The testing scope of this panel does not include the  following reported medications:  Allopurinol  Amlodipine  Cetirizine  Cyanocobalamin  Docusate  Evolocumab  Fluticasone  Furosemide  Glipizide  Iron  Isosorbide (Imdur)  Meclizine  Omega-3 Fatty Acids  Potassium (Klor-Con)  Ramipril  Tamsulosin ==================================================================== For clinical consultation, please call [phone]. ====================================================================       Medications ordered for procedure: Meds ordered this encounter  Medications  . iohexol (OMNIPAQUE) 180 MG/ML injection 10 mL    Must be Myelogram-compatible. If not available, you may substitute with a water-soluble, non-ionic, hypoallergenic, myelogram-compatible radiological contrast medium.  Marland Kitchen lidocaine (XYLOCAINE) 2 % (with pres) injection 400 mg  . fentaNYL (SUBLIMAZE) injection 25-50 mcg    Make sure Narcan is available in the pyxis when using this medication.  In the event of respiratory depression (RR< 8/min): Titrate NARCAN (naloxone) in increments of 0.1 to 0.2 mg IV at 2-3 minute intervals, until desired degree of reversal.  . ropivacaine (PF) 2 mg/mL (0.2%) (NAROPIN) injection 2 mL  . sodium chloride flush (NS) 0.9 % injection 2 mL  . dexamethasone (DECADRON) injection 10 mg  . HYDROcodone-acetaminophen (NORCO/VICODIN) 5-325 MG tablet    Sig: Take 1 tablet by mouth 2 (two) times daily as needed for severe pain. Must last 30 days.    Dispense:  60 tablet    Refill:  0    Chronic Pain. (STOP Act - Not applicable). Fill one day early if closed on  scheduled refill date.  Marland Kitchen HYDROcodone-acetaminophen (NORCO/VICODIN) 5-325 MG tablet    Sig: Take 1 tablet by mouth 2 (two) times daily as needed for severe pain. Must last 30 days.    Dispense:  60 tablet    Refill:  0    Chronic Pain. (STOP Act - Not applicable). Fill one day early if closed on scheduled refill date.  Marland Kitchen HYDROcodone-acetaminophen (NORCO/VICODIN) 5-325 MG tablet    Sig: Take 1 tablet by mouth 2 (two) times daily as needed for severe pain. Must last 30 days.    Dispense:  60 tablet    Refill:  0    Chronic Pain. (STOP Act - Not applicable). Fill one day early if closed on scheduled refill date.   Medications administered: We administered iohexol, lidocaine, fentaNYL, ropivacaine (PF) 2 mg/mL (0.2%), sodium chloride flush, and dexamethasone.  See the medical record for exact dosing, route, and time of administration.  Follow-up plan:   Return in about 4 weeks (around 09/23/2019) for Post Procedure Evaluation, virtual.       Interventional management options:  Considering:   S/p L2/3 ESI 08/26/19    Recent Visits Date Type Provider Dept  07/02/19 Office Visit Gillis Santa, MD [facility]  Showing recent visits within past 90 days and meeting all other requirements Today's Visits Date Type Provider Dept  08/26/19 Procedure visit Gillis Santa, MD [facility]  Showing today's visits and meeting all other requirements Future Appointments Date Type Provider Dept  09/24/19 Appointment Gillis Santa, MD [facility]  11/14/19 Appointment Gillis Santa, MD [facility]  Showing future appointments within next 90 days and meeting all other requirements  Disposition: Discharge home  Discharge (Date  Time): 08/26/2019; 1126 hrs.   Primary Care Physician: Leeanne Rio, MD Location: [facility] Note by: Gillis Santa, MD Date: 08/26/2019; Time: 12:11 PM  Disclaimer:  Medicine is not an exact science.  The only guarantee in medicine is that nothing is guaranteed. It is important to note that the decision to proceed with this intervention was based on the information collected from the patient. The Data and conclusions were drawn from the patient's questionnaire, the interview, and the physical examination. Because the information was provided in large part by the patient, it cannot be guaranteed that it has not been purposely or unconsciously manipulated. Every effort has been made to obtain as much relevant data as possible for this evaluation. It is important to note that the conclusions that lead to this procedure are derived in large part from the available data. Always take into account that the treatment will also be dependent on availability of resources and existing treatment guidelines, considered by other Pain Management Practitioners as being common knowledge and practice, at the time of the intervention. For Medico-Legal  purposes, it is also important to point out that variation in procedural techniques and pharmacological choices are the acceptable norm. The indications, contraindications, technique, and results of the above procedure should only be interpreted and judged by a Board-Certified Interventional Pain Specialist with extensive familiarity and expertise in the same exact procedure and technique.

## 2019-08-26 NOTE — Progress Notes (Signed)
Safety precautions to be maintained throughout the outpatient stay will include: orient to surroundings, keep bed in low position, maintain call bell within reach at all times, provide assistance with transfer out of bed and ambulation.  

## 2019-08-27 ENCOUNTER — Telehealth: Payer: Self-pay

## 2019-08-27 ENCOUNTER — Encounter: Payer: Medicare Other | Admitting: Student in an Organized Health Care Education/Training Program

## 2019-09-15 NOTE — Progress Notes (Signed)
Cardiology Office Note  Date: 09/16/2019   ID: Michael, Cobb 1934/10/03, MRN [id number]  PCP:  Leeanne Rio, MD  Cardiologist:  [name], MD Electrophysiologist:  None   Chief Complaint: Follow-up coronary artery disease  History of Present Illness: Michael Cobb is a 85 y.o. male with a history of coronary artery disease.  He has severe three-vessel coronary artery disease not amenable to surgical revascularization with chronic troponin elevation.  Chronic systolic heart failure.  Last encounter with Dr. Bronson Ing 03/22/2019.  He denied any chest pain, palpitation, leg swelling, shortness of breath.  His primary complaints related to right hip pain and left-sided neck pain.  He was symptomatically stable related to coronary artery disease.  Not a candidate for CABG and medical management was recommended.  He was continued on aspirin, Imdur, amlodipine.  He is statin intolerant and has tried multiple statins in the past which contributed to chronic joint pain.  His blood pressure was markedly elevated at that visit.  Plans were to monitor.  Last lipids on 09/12/2018 showed total cholesterol treated 30, triglycerides 209, HDL 35, LDL 206.  Lipid panel was ordered.  His last echo showed EF of 40 to 45% on 12/29/2018.  He was continuing his Lasix 40 mg daily.  Chronic kidney disease with last creatinine 1.73 on 02/11/2019.  He presents today for 28-month follow-up.  He denies any recent acute illnesses, hospitalizations.  States he  received the Covid vaccine.  States most of his issues recently stemming from back pain and arthritic pain.  States he is seeing a pain management provider and pain seems to be under better control.  He recently started on an extra antihyperglycemic agent Jardiance for better control of his diabetes.  States his most hemoglobin A1c was around 7%.  He denies any progressive anginal or exertional symptoms, palpitations or arrhythmias, orthostatic  symptoms, stroke or TIA-like symptoms, bleeding, PND, orthopnea, claudication, or lower extremity edema.  He is using a cane to ambulate.   Past Medical History:  Diagnosis Date  . BPH (benign prostatic hyperplasia)   . Cancer (HCC)    Skin  . Chronic renal insufficiency, stage 3 (moderate)   . Hyperlipidemia   . Hypertension   . Non-ST elevated myocardial infarction (non-STEMI) ([facility])   . Syncope   . Type 2 diabetes mellitus ([facility])     Past Surgical History:  Procedure Laterality Date  . APPENDECTOMY    . BACK SURGERY    . LEFT HEART CATH AND CORONARY ANGIOGRAPHY N/A 06/27/2017   Procedure: LEFT HEART CATH AND CORONARY ANGIOGRAPHY;  Surgeon: Burnell Blanks, MD;  Location: [facility];  Service: Cardiovascular;  Laterality: N/A;  . SURGERY OF LIP      Current Outpatient Medications  Medication Sig Dispense Refill  . acetaminophen (TYLENOL) 500 MG tablet Take 500 mg by mouth every 6 (six) hours as needed for mild pain or moderate pain.    Marland Kitchen amLODipine (NORVASC) 10 MG tablet TAKE ONE TABLET BY MOUTH DAILY 90 tablet 1  . aspirin EC 81 MG tablet Take 81 mg by mouth at bedtime.     . cetirizine (ZYRTEC) 10 MG tablet Take 10 mg by mouth at bedtime.     . cyanocobalamin 1000 MCG tablet Take 1 tablet by mouth daily.    Marland Kitchen docusate sodium (COLACE) 100 MG capsule Take 100 mg by mouth 2 (two) times daily.    . empagliflozin (JARDIANCE) 25 MG TABS tablet Take  25 mg by mouth daily.    . Evolocumab (REPATHA SURECLICK) 785 MG/ML SOAJ Inject 140 mg into the skin every 14 (fourteen) days. 6 pen 3  . ferrous sulfate 325 (65 FE) MG tablet Take 1 tablet (325 mg total) by mouth 2 (two) times daily with a meal. 30 tablet 3  . fluticasone (FLONASE) 50 MCG/ACT nasal spray Place 1 spray into both nostrils daily as needed for allergies or rhinitis.     . furosemide (LASIX) 40 MG tablet Take 1 tablet (40 mg total) by mouth daily. 30 tablet 2  . glipiZIDE (GLUCOTROL) 10 MG tablet Take 10 mg by  mouth 2 (two) times daily before a meal.   0  . [START ON 10/30/2019] HYDROcodone-acetaminophen (NORCO/VICODIN) 5-325 MG tablet Take 1 tablet by mouth 2 (two) times daily as needed for severe pain. Must last 30 days. 60 tablet 0  . isosorbide mononitrate (IMDUR) 30 MG 24 hr tablet TAKE ONE TABLET BY MOUTH DAILY 90 tablet 2  . meclizine (ANTIVERT) 25 MG tablet Take 25 mg by mouth 3 (three) times daily as needed for dizziness.    . Omega-3 Fatty Acids (FISH OIL PO) Take 1 tablet by mouth 2 (two) times daily.     . potassium chloride SA (KLOR-CON) 20 MEQ tablet Take 1 tablet (20 mEq total) by mouth daily. 90 tablet 3  . ramipril (ALTACE) 10 MG capsule Take 10 mg by mouth 2 (two) times daily.  0  . tamsulosin (FLOMAX) 0.4 MG CAPS capsule Take 0.4 mg by mouth at bedtime.     . vitamin B-12 1000 MCG tablet Take 1 tablet (1,000 mcg total) by mouth daily. 30 tablet 1  . gabapentin (NEURONTIN) 100 MG capsule Take 1-2 capsules (100-200 mg total) by mouth at bedtime. 60 capsule 2   No current facility-administered medications for this visit.   Allergies:  Morphine and related   Social History: The patient  reports that he has never smoked. He has never used smokeless tobacco. He reports that he does not drink alcohol and does not use drugs.   Family History: The patient's family history includes CVA in his father; Hypertension in his father.   ROS:  Please see the history of present illness. Otherwise, complete review of systems is positive for none.  All other systems are reviewed and negative.   Physical Exam: VS:  BP (!) 132/54   Pulse (!) 58   Ht 5\' 10"  (1.778 m)   Wt 230 lb (104.3 kg)   SpO2 98%   BMI 33.00 kg/m , BMI Body mass index is 33 kg/m.  Wt Readings from Last 3 Encounters:  09/16/19 230 lb (104.3 kg)  08/26/19 236 lb (107 kg)  07/02/19 230 lb (104.3 kg)    General: Patient appears comfortable at rest. Neck: Supple, no elevated JVP or carotid bruits, no thyromegaly. Lungs:  Clear to auscultation, nonlabored breathing at rest. Cardiac: Regular rate and rhythm, no S3 or significant systolic murmur, no pericardial rub. Extremities: No pitting edema, distal pulses 2+. Skin: Warm and dry. Musculoskeletal: No kyphosis. Neuropsychiatric: Alert and oriented x3, affect grossly appropriate.  ECG:  EKG 12/28/2018 showed sinus rhythm rate of 70, multiple premature complexes ventricular and supraventricular, inferior infarct, age indeterminate  Recent Labwork: 12/29/2018: ALT 18; AST 20 12/31/2018: Magnesium 1.9 02/05/2019: B Natriuretic Peptide 229.0 04/27/2019: BUN 31; Creatinine, Ser 1.66; Hemoglobin 11.0; Platelets 201; Potassium 6.3; Sodium 137     Component Value Date/Time   CHOL 305 (H) 03/25/2019  0240   TRIG 205 (H) 03/25/2019 0904   HDL 37 (L) 03/25/2019 0904   CHOLHDL 8.2 03/25/2019 0904   VLDL 41 (H) 03/25/2019 0904   LDLCALC 227 (H) 03/25/2019 0904    Other Studies Reviewed Today:  Echo 12/29/18 IMPRESSIONS   1. Left ventricular ejection fraction, by visual estimation, is 40 to 45%. The left ventricle has moderately decreased function. There is mildly increased left ventricular hypertrophy. 2. Inferior and lateral wall severe hypokinesis. 3. Left ventricular diastolic Doppler parameters are consistent with impaired relaxation pattern of LV diastolic filling. 4. Elevated left atrial and left ventricular end-diastolic pressures. 5. Global right ventricle has normal systolic function.The right ventricular size is normal. No increase in right ventricular wall thickness. 6. The aortic valve is tricuspid Aortic valve regurgitation was not visualized by color flow Doppler. Mild aortic valve sclerosis without stenosis. 7. Left atrial size was normal. 8. Right atrial size was normal. 9. The mitral valve is abnormal. Mild mitral valve regurgitation. 10. The tricuspid valve is grossly normal. Tricuspid valve regurgitation is trivial. 11. The  pulmonic valve was grossly normal. Pulmonic valve regurgitation is not visualized by color flow Doppler. 12. The inferior vena cava is normal in size with <50% respiratory variability, suggesting right atrial pressure of 8 mmHg.   Assessment and Plan:  1. CAD in native artery   2. Benign essential HTN   3. Hyperlipidemia LDL goal <70   4. Chronic systolic heart failure (HCC)   5. Stage 3 chronic kidney disease, unspecified whether stage 3a or 3b CKD    1. CAD in native artery Denies any progressive anginal or exertional symptoms.  Continue aspirin 81 mg daily.  Imdur 30 mg p.o. daily.  .  2. Benign essential HTN Blood pressure today 132/54.  Continue amlodipine 10 mg daily, ramipril 10 mg daily.  3. Hyperlipidemia LDL goal <70 03/25/2019 lipid panel showed total cholesterol 305, triglycerides 205, HDL 37, LDL 227.  Continue Repatha 140 mg.  4. Chronic systolic heart failure (HCC) No recent complaints of dyspnea on exertion, weight gain, or lower extremity edema.  EF on last echo October 2020 40 to 45% with severe inferior lateral wall hypokinesis.  Impaired relaxation, mild LVH, mild MR.  Continue ramipril 10 mg, Lasix 40 mg, Imdur 30 mg, potassium chloride 20 mEq  5. Stage 3 chronic kidney disease, unspecified whether stage 3a or 3b CKD 04/27/2019 showed creatinine 1.66 and GFR 38.  Previous creatinine on 02/11/2019 1.73  Medication Adjustments/Labs and Tests Ordered: Current medicines are reviewed at length with the patient today.  Concerns regarding medicines are outlined above.   Disposition: Follow-up with [facility] or APP 6 months  Signed,  Levell July, NP 09/16/2019 9:58 AM    [facility], [place], [place] [address] Phone: [phone]; Fax: [phone]

## 2019-09-16 ENCOUNTER — Encounter: Payer: Self-pay | Admitting: Family Medicine

## 2019-09-16 ENCOUNTER — Other Ambulatory Visit: Payer: Self-pay

## 2019-09-16 ENCOUNTER — Ambulatory Visit (INDEPENDENT_AMBULATORY_CARE_PROVIDER_SITE_OTHER): Payer: Medicare Other | Admitting: Family Medicine

## 2019-09-16 VITALS — BP 132/54 | HR 58 | Ht 70.0 in | Wt 230.0 lb

## 2019-09-16 DIAGNOSIS — I5022 Chronic systolic (congestive) heart failure: Secondary | ICD-10-CM

## 2019-09-16 DIAGNOSIS — I2510 Atherosclerotic heart disease of native coronary artery without angina pectoris: Secondary | ICD-10-CM

## 2019-09-16 DIAGNOSIS — I10 Essential (primary) hypertension: Secondary | ICD-10-CM

## 2019-09-16 DIAGNOSIS — N183 Chronic kidney disease, stage 3 unspecified: Secondary | ICD-10-CM

## 2019-09-16 DIAGNOSIS — E785 Hyperlipidemia, unspecified: Secondary | ICD-10-CM

## 2019-09-16 NOTE — Patient Instructions (Signed)
Your physician wants you to follow-up in: 6 MONTHS You will receive a reminder letter in the mail two months in advance. If you don't receive a letter, please call our office to schedule the follow-up appointment.  Your physician recommends that you continue on your current medications as directed. Please refer to the Current Medication list given to you today.  Thank you for choosing [facility]!!

## 2019-09-19 ENCOUNTER — Ambulatory Visit: Payer: Medicare Other | Admitting: Family Medicine

## 2019-09-23 ENCOUNTER — Encounter: Payer: Self-pay | Admitting: Student in an Organized Health Care Education/Training Program

## 2019-09-24 ENCOUNTER — Encounter: Payer: Self-pay | Admitting: Student in an Organized Health Care Education/Training Program

## 2019-09-24 ENCOUNTER — Other Ambulatory Visit: Payer: Self-pay

## 2019-09-24 ENCOUNTER — Ambulatory Visit
Payer: Medicare Other | Attending: Student in an Organized Health Care Education/Training Program | Admitting: Student in an Organized Health Care Education/Training Program

## 2019-09-24 ENCOUNTER — Ambulatory Visit (HOSPITAL_COMMUNITY)
Admission: RE | Admit: 2019-09-24 | Discharge: 2019-09-24 | Disposition: A | Discharge status: Home / Self Care | Payer: Medicare Other | Source: Ambulatory Visit | Attending: Student in an Organized Health Care Education/Training Program | Admitting: Student in an Organized Health Care Education/Training Program

## 2019-09-24 DIAGNOSIS — M19012 Primary osteoarthritis, left shoulder: Secondary | ICD-10-CM

## 2019-09-24 DIAGNOSIS — M25512 Pain in left shoulder: Secondary | ICD-10-CM | POA: Insufficient documentation

## 2019-09-24 DIAGNOSIS — G894 Chronic pain syndrome: Secondary | ICD-10-CM

## 2019-09-24 DIAGNOSIS — G8929 Other chronic pain: Secondary | ICD-10-CM

## 2019-09-24 DIAGNOSIS — Z981 Arthrodesis status: Secondary | ICD-10-CM

## 2019-09-24 DIAGNOSIS — M5416 Radiculopathy, lumbar region: Secondary | ICD-10-CM

## 2019-09-24 DIAGNOSIS — M48062 Spinal stenosis, lumbar region with neurogenic claudication: Secondary | ICD-10-CM

## 2019-09-24 NOTE — Patient Instructions (Signed)
Yeah!!!!

## 2019-09-24 NOTE — Progress Notes (Signed)
Patient: Michael Cobb  Service Category: E/M  Provider: Gillis Santa, MD  DOB: 16-Jun-1934  DOS: 09/24/2019  Location: Office  MRN: [id number]  Setting: Ambulatory outpatient  Referring Provider: Leeanne Rio, MD  Type: Established Patient  Specialty: Interventional Pain Management  PCP: Leeanne Rio, MD  Location: Home  Delivery: TeleHealth     Virtual Encounter - Pain Management PROVIDER NOTE: Information contained herein reflects review and annotations entered in association with encounter. Interpretation of such information and data should be left to medically-trained personnel. Information provided to patient can be located elsewhere in the medical record under "Patient Instructions". Document created using STT-dictation technology, any transcriptional errors that may result from process are unintentional.    Contact & Pharmacy Preferred: [phone] Home: [phone] (home) Mobile: There is no such number on file (mobile). E-mail: No e-mail address on record  [place] [facility] [place] [address] [place] [address] Phone: [phone] Fax: [phone]   Pre-screening  Michael Cobb offered "in-person" vs "virtual" encounter. He indicated preferring virtual for this encounter.   Reason COVID-19*  Social distancing based on CDC and AMA recommendations.   I contacted Michael Cobb on 09/24/2019 via video conference.      I clearly identified myself as Gillis Santa, MD. I verified that I was speaking with the correct person using two identifiers (Name: Michael Cobb, and date of birth: 12-14-1934).  Consent I sought verbal advanced consent from Michael Cobb for virtual visit interactions. I informed Michael Cobb of possible security and privacy concerns, risks, and limitations associated with providing "not-in-person" medical evaluation and management services. I also informed Michael Cobb of the availability of "in-person" appointments. Finally,  I informed him that there would be a charge for the virtual visit and that he could be  personally, fully or partially, financially responsible for it. Michael Cobb expressed understanding and agreed to proceed.   Historic Elements   Michael Cobb is a 85 y.o. year old, male patient evaluated today after his last contact with our practice on 08/27/2019. Michael Cobb  has a past medical history of BPH (benign prostatic hyperplasia), Cancer ([facility]), Chronic renal insufficiency, stage 3 (moderate), Hyperlipidemia, Hypertension, Non-ST elevated myocardial infarction (non-STEMI) ([facility]), Syncope, and Type 2 diabetes mellitus ([facility]). He also  has a past surgical history that includes Appendectomy; Back surgery; Surgery of lip; and LEFT HEART CATH AND CORONARY ANGIOGRAPHY (N/A, 06/27/2017). Michael Cobb has a current medication list which includes the following prescription(s): acetaminophen, amlodipine, aspirin ec, cetirizine, cyanocobalamin, docusate sodium, empagliflozin, ergocalciferol, escitalopram, repatha sureclick, ferrous sulfate, fluticasone, furosemide, glipizide, glipizide, [START ON 10/30/2019] hydrocodone-acetaminophen, isosorbide mononitrate, meclizine, omega-3 fatty acids, potassium chloride sa, ramipril, tamsulosin, cyanocobalamin, and gabapentin. He  reports that he has never smoked. He has never used smokeless tobacco. He reports that he does not drink alcohol and does not use drugs. Michael Cobb is allergic to metformin and morphine and related.   HPI  Today, he is being contacted for a post-procedure assessment.  Post-Procedure Evaluation  Procedure:   Type: Diagnostic Inter-Laminar Epidural Steroid Injection  #1  Region: Lumbar Level: L2-3 Level. (with Tuohy bevel down) Laterality: Midline       Sedation: Please see nurses note.  Effectiveness during initial hour after procedure(Ultra-Short Term Relief): 100 %   Local anesthetic used: Long-acting (4-6 hours) Effectiveness: Defined as any  analgesic benefit obtained secondary to the administration of local anesthetics. This carries significant diagnostic value as to  the etiological location, or anatomical origin, of the pain. Duration of benefit is expected to coincide with the duration of the local anesthetic used.  Effectiveness during initial 4-6 hours after procedure(Short-Term Relief): 100 %   Long-term benefit: Defined as any relief past the pharmacologic duration of the local anesthetics.  Effectiveness past the initial 6 hours after procedure(Long-Term Relief): 80 %  Current benefits: Defined as benefit that persist at this time.   Analgesia:  90-100% better Function: Somewhat improved ROM: Somewhat improved   Laboratory Chemistry Profile   Renal Lab Results  Component Value Date   BUN 31 (H) 04/27/2019   CREATININE 1.66 (H) 04/27/2019   GFRAA 44 (L) 04/27/2019   GFRNONAA 38 (L) 04/27/2019     Hepatic Lab Results  Component Value Date   AST 20 12/29/2018   ALT 18 12/29/2018   ALBUMIN 3.0 (L) 12/29/2018   ALKPHOS 69 12/29/2018     Electrolytes Lab Results  Component Value Date   NA 137 04/27/2019   K 6.3 (HH) 04/27/2019   CL 111 04/27/2019   CALCIUM 8.9 04/27/2019   MG 1.9 12/31/2018     Bone No results found for: VD25OH, DJ570VX7LTJ, QZ0092ZR0, QT6226JF3, 25OHVITD1, 25OHVITD2, 25OHVITD3, TESTOFREE, TESTOSTERONE   Inflammation (CRP: Acute Phase) (ESR: Chronic Phase) Lab Results  Component Value Date   CRP 2.4 (H) 11/20/2018   ESRSEDRATE 72 (H) 11/20/2018   LATICACIDVEN 1.7 09/09/2017       Note: Above Lab results reviewed.    Assessment  The primary encounter diagnosis was Chronic left shoulder pain. Diagnoses of Primary osteoarthritis of left shoulder, Chronic radicular lumbar pain, Lumbar radiculopathy, Spinal stenosis, lumbar region, with neurogenic claudication, History of lumbar fusion, and Chronic pain syndrome were also pertinent to this visit.  Plan of Care   Michael Cobb has a current medication list which includes the following long-term medication(s): amlodipine, cetirizine, escitalopram, ferrous sulfate, fluticasone, furosemide, glipizide, glipizide, isosorbide mononitrate, potassium chloride sa, ramipril, and gabapentin.   Follows up for postprocedural evaluation after midline L2-L3 lumbar epidural steroid injection performed on 08/26/2019.  This has provided the patient with significant pain relief in regards to his lumbar radicular pain symptoms.  He states that he is noticing approximately 80% pain relief and improvement in range of motion and functional status.  Repeat lumbar epidural steroid injection as needed  He is endorsing persistent left shoulder pain.  This has been chronic in nature but got acutely worse after his Covid vaccine in his left shoulder.  No recent imaging in computer.  Plan for left shoulder x-ray and left shoulder steroid injection under fluoroscopy.  Continue medications as prescribed.  Medication refill appointment already in place for September.  Orders:  Orders Placed This Encounter  Procedures  . SHOULDER INJECTION    Standing Status:   Future    Standing Expiration Date:   12/25/2019    Scheduling Instructions:     Side: LEFT     Sedation: Patient's choice.     Timeframe: As soon as schedule allows    Order Specific Question:   Where will this procedure be performed?    Answer:   [facility]    Comments:   Shirly Bartosiewicz  . Lumbar Epidural Injection    Standing Status:   Standing    Number of Occurrences:   9    Standing Expiration Date:   09/23/2020    Scheduling Instructions:     Purpose: Palliative     Indication: Lower  extremity pain/Sciatica unspecified side (M54.30).     Side: Midline     Level: TBD     Sedation: Patient's choice.     TIMEFRAME: PRN procedure. (Michael Cobb will call when needed.)    Order Specific Question:   Where will this procedure be performed?    Answer:   [facility]  .  DG Shoulder Left    Standing Status:   Future    Standing Expiration Date:   03/26/2020    Order Specific Question:   Reason for Exam (SYMPTOM  OR DIAGNOSIS REQUIRED)    Answer:   Left shoulder pain    Order Specific Question:   Preferred imaging location?    Answer:   [facility]    Order Specific Question:   Call Results- Best Contact Number?    Answer:   [phone]    Order Specific Question:   Release to patient    Answer:   Immediate   Follow-up plan:   Return in about 2 weeks (around 10/08/2019) for Left shoulder injection.      Interventional management options:  Considering:   S/p L2/3 ESI 08/26/19: Helped significantly, repeat as needed    Recent Visits Date Type Provider Dept  08/26/19 Procedure visit Gillis Santa, MD [facility]  07/02/19 Office Visit Gillis Santa, MD [facility]  Showing recent visits within past 90 days and meeting all other requirements Today's Visits Date Type Provider Dept  09/24/19 Telemedicine Gillis Santa, MD [facility]  Showing today's visits and meeting all other requirements Future Appointments Date Type Provider Dept  11/14/19 Appointment Gillis Santa, MD [facility]  Showing future appointments within next 90 days and meeting all other requirements  I discussed the assessment and treatment plan with the patient. The patient was provided an opportunity to ask questions and all were answered. The patient agreed with the plan and demonstrated an understanding of the instructions.  Patient advised to call back or seek an in-person evaluation if the symptoms or condition worsens.  Duration of encounter: 20mnutes.  Note by: BGillis Santa MD Date: 09/24/2019; Time: 1:22 PM

## 2019-10-09 ENCOUNTER — Encounter: Payer: Self-pay | Admitting: Student in an Organized Health Care Education/Training Program

## 2019-10-09 ENCOUNTER — Other Ambulatory Visit: Payer: Self-pay

## 2019-10-09 ENCOUNTER — Ambulatory Visit
Admission: RE | Admit: 2019-10-09 | Discharge: 2019-10-09 | Disposition: A | Discharge status: Home / Self Care | Payer: Medicare Other | Source: Ambulatory Visit | Attending: Student in an Organized Health Care Education/Training Program | Admitting: Student in an Organized Health Care Education/Training Program

## 2019-10-09 ENCOUNTER — Ambulatory Visit (HOSPITAL_BASED_OUTPATIENT_CLINIC_OR_DEPARTMENT_OTHER): Payer: Medicare Other | Admitting: Student in an Organized Health Care Education/Training Program

## 2019-10-09 VITALS — BP 162/68 | HR 61 | Temp 97.2°F | Resp 17 | Ht 70.0 in | Wt 232.0 lb

## 2019-10-09 DIAGNOSIS — G894 Chronic pain syndrome: Secondary | ICD-10-CM | POA: Insufficient documentation

## 2019-10-09 DIAGNOSIS — M25512 Pain in left shoulder: Secondary | ICD-10-CM

## 2019-10-09 DIAGNOSIS — G8929 Other chronic pain: Secondary | ICD-10-CM | POA: Insufficient documentation

## 2019-10-09 DIAGNOSIS — M19012 Primary osteoarthritis, left shoulder: Secondary | ICD-10-CM | POA: Insufficient documentation

## 2019-10-09 MED ORDER — ROPIVACAINE HCL 2 MG/ML IJ SOLN
4.0000 mL | Freq: Once | INTRAMUSCULAR | Status: AC
  Administered 2019-10-09: 4 mL via INTRA_ARTICULAR
  Filled 2019-10-09: qty 10

## 2019-10-09 MED ORDER — LIDOCAINE HCL 2 % IJ SOLN
20.0000 mL | Freq: Once | INTRAMUSCULAR | Status: AC
  Administered 2019-10-09: 200 mg
  Filled 2019-10-09: qty 10

## 2019-10-09 MED ORDER — METHYLPREDNISOLONE ACETATE 40 MG/ML IJ SUSP
40.0000 mg | Freq: Once | INTRAMUSCULAR | Status: AC
  Administered 2019-10-09: 40 mg via INTRA_ARTICULAR
  Filled 2019-10-09: qty 1

## 2019-10-09 MED ORDER — IOHEXOL 180 MG/ML  SOLN
10.0000 mL | Freq: Once | INTRAMUSCULAR | Status: AC
  Administered 2019-10-09: 10 mL via INTRA_ARTICULAR
  Filled 2019-10-09: qty 20

## 2019-10-09 NOTE — Progress Notes (Signed)
PROVIDER NOTE: Information contained herein reflects review and annotations entered in association with encounter. Interpretation of such information and data should be left to medically-trained personnel. Information provided to patient can be located elsewhere in the medical record under "Patient Instructions". Document created using STT-dictation technology, any transcriptional errors that may result from process are unintentional.    Patient: Michael Cobb  Service Category: Procedure  Provider: Gillis Santa, MD  DOB: 1934/11/06  DOS: 10/09/2019  Location: [facility]  MRN: [id number]  Setting: Ambulatory - outpatient  Referring Provider: Leeanne Rio, MD  Type: Established Patient  Specialty: Interventional Pain Management  PCP: Leeanne Rio, MD   Primary Reason for Visit: Interventional Pain Management Treatment. CC: Shoulder Pain (left) and Arm Pain (left)  Procedure:          Anesthesia, Analgesia, Anxiolysis:  Type: Diagnostic Glenohumeral Joint (shoulder) Injection #1  Primary Purpose: Diagnostic Region: Superior Shoulder Area Level:  Shoulder Target Area: Glenohumeral Joint (shoulder) Approach: Anterior approach. Laterality: Left-Sided  Type: Local Anesthesia  Local Anesthetic: Lidocaine 1-2%  Position: Supine   Indications: 1. Chronic left shoulder pain   2. Primary osteoarthritis of left shoulder   3. Chronic pain syndrome    Pain Score: Pre-procedure: 8 /10 Post-procedure: 0-No pain/10   Pre-op Assessment:  Michael Cobb is a 85 y.o. (year old), male patient, seen today for interventional treatment. He  has a past surgical history that includes Appendectomy; Back surgery; Surgery of lip; and LEFT HEART CATH AND CORONARY ANGIOGRAPHY (N/A, 06/27/2017). Michael Cobb has a current medication list which includes the following prescription(s): acetaminophen, amlodipine, aspirin ec, cetirizine, cyanocobalamin, docusate sodium, empagliflozin, ergocalciferol,  escitalopram, repatha sureclick, ferrous sulfate, fluticasone, furosemide, glipizide, glipizide, [START ON 10/30/2019] hydrocodone-acetaminophen, isosorbide mononitrate, meclizine, omega-3 fatty acids, potassium chloride sa, ramipril, tamsulosin, cyanocobalamin, and gabapentin. His primarily concern today is the Shoulder Pain (left) and Arm Pain (left)  Initial Vital Signs:  Pulse/HCG Rate: 61ECG Heart Rate: (!) 57 Temp: (!) 97.2 F (36.2 C) Resp: 18 BP: (!) 141/67 SpO2: 99 %  BMI: Estimated body mass index is 33.29 kg/m as calculated from the following:   Height as of this encounter: 5\' 10"  (1.778 m).   Weight as of this encounter: 232 lb (105.2 kg).  Risk Assessment: Allergies: Reviewed. He is allergic to metformin and morphine and related.  Allergy Precautions: None required Coagulopathies: Reviewed. None identified.  Blood-thinner therapy: None at this time Active Infection(s): Reviewed. None identified. Michael Cobb is afebrile  Site Confirmation: Michael Cobb was asked to confirm the procedure and laterality before marking the site Procedure checklist: Completed Consent: Before the procedure and under the influence of no sedative(s), amnesic(s), or anxiolytics, the patient was informed of the treatment options, risks and possible complications. To fulfill our ethical and legal obligations, as recommended by the American Medical Association's Code of Ethics, I have informed the patient of my clinical impression; the nature and purpose of the treatment or procedure; the risks, benefits, and possible complications of the intervention; the alternatives, including doing nothing; the risk(s) and benefit(s) of the alternative treatment(s) or procedure(s); and the risk(s) and benefit(s) of doing nothing. The patient was provided information about the general risks and possible complications associated with the procedure. These may include, but are not limited to: failure to achieve desired goals,  infection, bleeding, organ or nerve damage, allergic reactions, paralysis, and death. In addition, the patient was informed of those risks and complications associated to the procedure, such as failure  to decrease pain; infection; bleeding; organ or nerve damage with subsequent damage to sensory, motor, and/or autonomic systems, resulting in permanent pain, numbness, and/or weakness of one or several areas of the body; allergic reactions; (i.e.: anaphylactic reaction); and/or death. Furthermore, the patient was informed of those risks and complications associated with the medications. These include, but are not limited to: allergic reactions (i.e.: anaphylactic or anaphylactoid reaction(s)); adrenal axis suppression; blood sugar elevation that in diabetics may result in ketoacidosis or comma; water retention that in patients with history of congestive heart failure may result in shortness of breath, pulmonary edema, and decompensation with resultant heart failure; weight gain; swelling or edema; medication-induced neural toxicity; particulate matter embolism and blood vessel occlusion with resultant organ, and/or nervous system infarction; and/or aseptic necrosis of one or more joints. Finally, the patient was informed that Medicine is not an exact science; therefore, there is also the possibility of unforeseen or unpredictable risks and/or possible complications that may result in a catastrophic outcome. The patient indicated having understood very clearly. We have given the patient no guarantees and we have made no promises. Enough time was given to the patient to ask questions, all of which were answered to the patient's satisfaction. Michael Cobb has indicated that he wanted to continue with the procedure. Attestation: I, the ordering provider, attest that I have discussed with the patient the benefits, risks, side-effects, alternatives, likelihood of achieving goals, and potential problems during recovery for  the procedure that I have provided informed consent. Date   Time: 10/09/2019 10:10 AM  Pre-Procedure Preparation:  Monitoring: As per clinic protocol. Respiration, ETCO2, SpO2, BP, heart rate and rhythm monitor placed and checked for adequate function Safety Precautions: Patient was assessed for positional comfort and pressure points before starting the procedure. Time-out: I initiated and conducted the "Time-out" before starting the procedure, as per protocol. The patient was asked to participate by confirming the accuracy of the "Time Out" information. Verification of the correct person, site, and procedure were performed and confirmed by me, the nursing staff, and the patient. "Time-out" conducted as per Joint Commission's Universal Protocol (UP.01.01.01). Time: 1105  Description of Procedure:          Area Prepped: Entire shoulder Area DuraPrep (Iodine Povacrylex [0.7% available iodine] and Isopropyl Alcohol, 74% w/w) Safety Precautions: Aspiration looking for blood return was conducted prior to all injections. At no point did we inject any substances, as a needle was being advanced. No attempts were made at seeking any paresthesias. Safe injection practices and needle disposal techniques used. Medications properly checked for expiration dates. SDV (single dose vial) medications used. Description of the Procedure: Protocol guidelines were followed. The patient was placed in position over the procedure table. The target area was identified and the area prepped in the usual manner. Skin & deeper tissues infiltrated with local anesthetic. Appropriate amount of time allowed to pass for local anesthetics to take effect. The procedure needles were then advanced to the target area. Proper needle placement secured. Negative aspiration confirmed. Solution injected in intermittent fashion, asking for systemic symptoms every 0.5cc of injectate. The needles were then removed and the area cleansed, making sure to  leave some of the prepping solution back to take advantage of its long term bactericidal properties.         Vitals:   10/09/19 1036 10/09/19 1101 10/09/19 1105 10/09/19 1108  BP: (!) 141/67 (!) 168/74 (!) 158/77 (!) 162/68  Pulse: 61     Resp: 18 16 (!)  21 17  Temp: (!) 97.2 F (36.2 C)     TempSrc: Temporal     SpO2: 99% 99% 98% 99%  Weight: 232 lb (105.2 kg)     Height: 5\' 10"  (1.778 m)       Start Time: 1105 hrs. End Time: 1108 hrs. Materials:  Needle(s) Type: Spinal Needle Gauge: 22G Length: 3.5-in Medication(s): Please see orders for medications and dosing details. 5 cc solution made of 4 cc of 0.2% ropivacaine, 1 cc of methylprednisolone, 40 mg/cc.  Injected into left glenohumeral joint under fluoroscopy. Imaging Guidance (Non-Spinal):          Type of Imaging Technique: Fluoroscopy Guidance (Non-Spinal) Indication(s): Assistance in needle guidance and placement for procedures requiring needle placement in or near specific anatomical locations not easily accessible without such assistance. Exposure Time: Please see nurses notes. Contrast: Before injecting any contrast, we confirmed that the patient did not have an allergy to iodine, shellfish, or radiological contrast. Once satisfactory needle placement was completed at the desired level, radiological contrast was injected. Contrast injected under live fluoroscopy. No contrast complications. See chart for type and volume of contrast used. Fluoroscopic Guidance: I was personally present during the use of fluoroscopy. "Tunnel Vision Technique" used to obtain the best possible view of the target area. Parallax error corrected before commencing the procedure. "Direction-depth-direction" technique used to introduce the needle under continuous pulsed fluoroscopy. Once target was reached, antero-posterior, oblique, and lateral fluoroscopic projection used confirm needle placement in all planes. Images permanently stored in  EMR. Interpretation: I personally interpreted the imaging intraoperatively. Adequate needle placement confirmed in multiple planes. Appropriate spread of contrast into desired area was observed. No evidence of afferent or efferent intravascular uptake. Permanent images saved into the patient's record.  Antibiotic Prophylaxis:   Anti-infectives (From admission, onward)   None     Indication(s): None identified  Post-operative Assessment:  Post-procedure Vital Signs:  Pulse/HCG Rate: 61(!) 56 Temp: (!) 97.2 F (36.2 C) Resp: 17 BP: (!) 162/68 SpO2: 99 %  EBL: None  Complications: No immediate post-treatment complications observed by team, or reported by patient.  Note: The patient tolerated the entire procedure well. A repeat set of vitals were taken after the procedure and the patient was kept under observation following institutional policy, for this type of procedure. Post-procedural neurological assessment was performed, showing return to baseline, prior to discharge. The patient was provided with post-procedure discharge instructions, including a section on how to identify potential problems. Should any problems arise concerning this procedure, the patient was given instructions to immediately contact us, at any time, without hesitation. In any case, we plan to contact the patient by telephone for a follow-up status report regarding this interventional procedure.  Comments:  No additional relevant information.  Plan of Care  Orders:  Orders Placed This Encounter  Procedures   DG PAIN [facility] C-ARM 1-60 MIN NO REPORT    Intraoperative interpretation by procedural physician at [facility].    Standing Status:   Standing    Number of Occurrences:   1    Order Specific Question:   Reason for exam:    Answer:   Assistance in needle guidance and placement for procedures requiring needle placement in or near specific anatomical locations not easily accessible without such  assistance.    Medications ordered for procedure: Meds ordered this encounter  Medications   lidocaine (XYLOCAINE) 2 % (with pres) injection 400 mg   ropivacaine (PF) 2 mg/mL (0.2%) (NAROPIN) injection 4  mL   methylPREDNISolone acetate (DEPO-MEDROL) injection 40 mg   iohexol (OMNIPAQUE) 180 MG/ML injection 10 mL    Must be Myelogram-compatible. If not available, you may substitute with a water-soluble, non-ionic, hypoallergenic, myelogram-compatible radiological contrast medium.   Medications administered: We administered lidocaine, ropivacaine (PF) 2 mg/mL (0.2%), methylPREDNISolone acetate, and iohexol.  See the medical record for exact dosing, route, and time of administration.  Follow-up plan:   Return in about 4 weeks (around 11/06/2019) for Post Procedure Evaluation, virtual.       Interventional management options:  Considering:   S/p L2/3 ESI 08/26/19: Helped significantly, repeat as needed.  Status post left anterior shoulder injection on 10/09/2019    Recent Visits Date Type Provider Dept  09/24/19 Telemedicine Gillis Santa, MD [facility]  08/26/19 Procedure visit Gillis Santa, MD [facility]  Showing recent visits within past 90 days and meeting all other requirements Today's Visits Date Type Provider Dept  10/09/19 Procedure visit Gillis Santa, MD [facility]  Showing today's visits and meeting all other requirements Future Appointments Date Type Provider Dept  11/14/19 Appointment Gillis Santa, MD [facility]  Showing future appointments within next 90 days and meeting all other requirements  Disposition: Discharge home  Discharge (Date   Time): 10/09/2019; 1115 hrs.   Primary Care Physician: Leeanne Rio, MD Location: [facility] Note by: Gillis Santa, MD Date: 10/09/2019; Time: 2:23 PM  Disclaimer:  Medicine is not an exact science. The only guarantee in medicine is that nothing  is guaranteed. It is important to note that the decision to proceed with this intervention was based on the information collected from the patient. The Data and conclusions were drawn from the patient's questionnaire, the interview, and the physical examination. Because the information was provided in large part by the patient, it cannot be guaranteed that it has not been purposely or unconsciously manipulated. Every effort has been made to obtain as much relevant data as possible for this evaluation. It is important to note that the conclusions that lead to this procedure are derived in large part from the available data. Always take into account that the treatment will also be dependent on availability of resources and existing treatment guidelines, considered by other Pain Management Practitioners as being common knowledge and practice, at the time of the intervention. For Medico-Legal purposes, it is also important to point out that variation in procedural techniques and pharmacological choices are the acceptable norm. The indications, contraindications, technique, and results of the above procedure should only be interpreted and judged by a Board-Certified Interventional Pain Specialist with extensive familiarity and expertise in the same exact procedure and technique.

## 2019-10-09 NOTE — Progress Notes (Signed)
Safety precautions to be maintained throughout the outpatient stay will include: orient to surroundings, keep bed in low position, maintain call bell within reach at all times, provide assistance with transfer out of bed and ambulation.  

## 2019-10-09 NOTE — Patient Instructions (Signed)

## 2019-11-14 ENCOUNTER — Encounter: Payer: Self-pay | Admitting: Student in an Organized Health Care Education/Training Program

## 2019-11-14 ENCOUNTER — Ambulatory Visit
Payer: Medicare Other | Attending: Student in an Organized Health Care Education/Training Program | Admitting: Student in an Organized Health Care Education/Training Program

## 2019-11-14 ENCOUNTER — Other Ambulatory Visit: Payer: Self-pay

## 2019-11-14 VITALS — BP 152/71 | HR 65 | Temp 97.1°F | Ht 70.0 in | Wt 232.0 lb

## 2019-11-14 DIAGNOSIS — M25512 Pain in left shoulder: Secondary | ICD-10-CM | POA: Diagnosis not present

## 2019-11-14 DIAGNOSIS — M5416 Radiculopathy, lumbar region: Secondary | ICD-10-CM | POA: Diagnosis not present

## 2019-11-14 DIAGNOSIS — G8929 Other chronic pain: Secondary | ICD-10-CM | POA: Insufficient documentation

## 2019-11-14 DIAGNOSIS — M19012 Primary osteoarthritis, left shoulder: Secondary | ICD-10-CM | POA: Diagnosis not present

## 2019-11-14 DIAGNOSIS — G894 Chronic pain syndrome: Secondary | ICD-10-CM | POA: Diagnosis not present

## 2019-11-14 MED ORDER — HYDROCODONE-ACETAMINOPHEN 5-325 MG PO TABS: 1.0000 | ORAL_TABLET | Freq: Two times a day (BID) | ORAL | 0 refills | Status: DC | PRN

## 2019-11-14 NOTE — Progress Notes (Signed)
Nursing Pain Medication Assessment:  Safety precautions to be maintained throughout the outpatient stay will include: orient to surroundings, keep bed in low position, maintain call bell within reach at all times, provide assistance with transfer out of bed and ambulation.  Medication Inspection Compliance: Mr. Coy did not comply with our request to bring his pills to be counted. He was reminded that bringing the medication bottles, even when empty, is a requirement.  Medication: None brought in. Pill/Patch Count: None available to be counted. Bottle Appearance: No container available. Did not bring bottle(s) to appointment. Filled Date: N/A Last Medication intake:  TodaySafety precautions to be maintained throughout the outpatient stay will include: orient to surroundings, keep bed in low position, maintain call bell within reach at all times, provide assistance with transfer out of bed and ambulation.

## 2019-11-14 NOTE — Progress Notes (Signed)
PROVIDER NOTE: Information contained herein reflects review and annotations entered in association with encounter. Interpretation of such information and data should be left to medically-trained personnel. Information provided to patient can be located elsewhere in the medical record under "Patient Instructions". Document created using STT-dictation technology, any transcriptional errors that may result from process are unintentional.    Patient: Michael Cobb  Service Category: E/M  Provider: Gillis Santa, MD  DOB: 03-Oct-1934  DOS: 11/14/2019  Specialty: Interventional Pain Management  MRN: [id number]  Setting: Ambulatory outpatient  PCP: Leeanne Rio, MD  Type: Established Patient    Referring Provider: Leeanne Rio, MD  Location: Office  Delivery: Face-to-face     HPI  Reason for encounter: Michael Cobb, a 85 y.o. year old male, is here today for evaluation and management of his Primary osteoarthritis of left shoulder [M19.012]. Michael Cobb primary complain today is Back Pain Last encounter: Practice (10/09/2019). My last encounter with him was on 10/09/2019. Pertinent problems: Michael Cobb has DDD (degenerative disc disease), cervical; CKD (chronic kidney disease), stage III; Chronic left shoulder pain; Lumbar radiculopathy; Lumbar facet arthropathy; Spinal stenosis, lumbar region, with neurogenic claudication; History of lumbar fusion; Chronic radicular lumbar pain; Cervical facet joint syndrome; S/P cervical spinal fusion; Pain management contract signed; and Primary osteoarthritis of left shoulder on their pertinent problem list. Pain Assessment: Severity of Chronic pain is reported as a 4 /10. Location: Back Right, Left/Denies. Onset: More than a month ago. Quality: Sharp, Constant. Timing: Constant. Modifying factor(s): procedure help and meds. Vitals:  height is '5\' 10"'  (1.778 m) and weight is 232 lb (105.2 kg). His temperature is 97.1 F (36.2 C) (abnormal). His blood pressure is  152/71 (abnormal) and his pulse is 65. His oxygen saturation is 98%.   Patient presents today for medication management and postprocedural evaluation after his left glenohumeral steroid injection in his left shoulder which provided significant pain relief.  Patient is endorsing improvement in his range of motion and his left shoulder pain.  He is experiencing worsening low back pain.  Of note he did have a L2-L3 lumbar epidural steroid injection on 08/25/2019 which provided significant pain relief for his low back pain that radiates into his hips and thighs (R>L).  Recommend repeating as below.  Post-Procedure Evaluation  Procedure (10/09/2019): Left glenohumeral joint injection, anterior approach  Sedation: Please see nurses note.  Effectiveness during initial hour after procedure(Ultra-Short Term Relief): 100 %   Local anesthetic used: Long-acting (4-6 hours) Effectiveness: Defined as any analgesic benefit obtained secondary to the administration of local anesthetics. This carries significant diagnostic value as to the etiological location, or anatomical origin, of the pain. Duration of benefit is expected to coincide with the duration of the local anesthetic used.  Effectiveness during initial 4-6 hours after procedure(Short-Term Relief): 100 %   Long-term benefit: Defined as any relief past the pharmacologic duration of the local anesthetics.  Effectiveness past the initial 6 hours after procedure(Long-Term Relief): 100 %  Current benefits: Defined as benefit that persist at this time.   Analgesia:  >75% relief Function: Somewhat improved ROM: Somewhat improved   Pharmacotherapy Assessment   Analgesic: Hydrocodone 5 mg twice daily as needed, quantity 60/month MME equals 10   Monitoring: [place] PMP: PDMP not reviewed this encounter.       Pharmacotherapy: No side-effects or adverse reactions reported. Compliance: No problems identified. Effectiveness: Clinically acceptable.  Chauncey Fischer, RN  11/14/2019 10:00 AM  Sign when Signing Visit Nursing  Pain Medication Assessment:  Safety precautions to be maintained throughout the outpatient stay will include: orient to surroundings, keep bed in low position, maintain call bell within reach at all times, provide assistance with transfer out of bed and ambulation.  Medication Inspection Compliance: Michael Cobb did not comply with our request to bring his pills to be counted. He was reminded that bringing the medication bottles, even when empty, is a requirement.  Medication: None brought in. Pill/Patch Count: None available to be counted. Bottle Appearance: No container available. Did not bring bottle(s) to appointment. Filled Date: N/A Last Medication intake:  TodaySafety precautions to be maintained throughout the outpatient stay will include: orient to surroundings, keep bed in low position, maintain call bell within reach at all times, provide assistance with transfer out of bed and ambulation.     UDS:  Summary  Date Value Ref Range Status  04/24/2019 Note  Final    Comment:    ==================================================================== Compliance Drug Analysis, Ur ==================================================================== Test                             Result       Flag       Units Drug Present and Declared for Prescription Verification   Acetaminophen                  PRESENT      EXPECTED   Guaifenesin                    PRESENT      EXPECTED    Guaifenesin may be administered as an over-the-counter or    prescription drug; it may also be present as a breakdown product of    methocarbamol. Drug Absent but Declared for Prescription Verification   Gabapentin                     Not Detected UNEXPECTED   Salicylate                     Not Detected UNEXPECTED    Aspirin, as indicated in the declared medication list, is not always    detected even when used as directed.   Dextromethorphan                Not Detected UNEXPECTED ==================================================================== Test                      Result    Flag   Units      Ref Range   Creatinine              75               mg/dL      >=20 ==================================================================== Declared Medications:  The flagging and interpretation on this report are based on the  following declared medications.  Unexpected results may arise from  inaccuracies in the declared medications.  **Note: The testing scope of this panel includes these medications:  Dextromethorphan (Mucinex DM)  Gabapentin  Guaifenesin (Mucinex DM)  **Note: The testing scope of this panel does not include small to  moderate amounts of these reported medications:  Acetaminophen  Aspirin  **Note: The testing scope of this panel does not include the  following reported medications:  Allopurinol  Amlodipine  Cetirizine  Cyanocobalamin  Docusate  Evolocumab  Fluticasone  Furosemide  Glipizide  Iron  Isosorbide (  Imdur)  Meclizine  Omega-3 Fatty Acids  Potassium (Klor-Con)  Ramipril  Tamsulosin ==================================================================== For clinical consultation, please call [phone]. ====================================================================      ROS  Constitutional: Denies any fever or chills Gastrointestinal: No reported hemesis, hematochezia, vomiting, or acute GI distress Musculoskeletal: Low back pain, with radiation into the right hip and right posterior lateral thigh Neurological: No reported episodes of acute onset apraxia, aphasia, dysarthria, agnosia, amnesia, paralysis, loss of coordination, or loss of consciousness  Medication Review  Evolocumab, HYDROcodone-acetaminophen, Omega-3 Fatty Acids, acetaminophen, amLODipine, aspirin EC, cetirizine, cyanocobalamin, docusate sodium, empagliflozin, ergocalciferol, escitalopram, ferrous sulfate, fluticasone,  furosemide, gabapentin, glipiZIDE, isosorbide mononitrate, meclizine, potassium chloride SA, ramipril, and tamsulosin  History Review  Allergy: Michael Cobb is allergic to metformin and morphine and related. Drug: Michael Cobb  reports no history of drug use. Alcohol:  reports no history of alcohol use. Tobacco:  reports that he has never smoked. He has never used smokeless tobacco. Social: Michael Cobb  reports that he has never smoked. He has never used smokeless tobacco. He reports that he does not drink alcohol and does not use drugs. Medical:  has a past medical history of BPH (benign prostatic hyperplasia), Cancer ([place]), Chronic renal insufficiency, stage 3 (moderate), Hyperlipidemia, Hypertension, Non-ST elevated myocardial infarction (non-STEMI) ([place]), Syncope, and Type 2 diabetes mellitus ([place]). Surgical: Michael Cobb  has a past surgical history that includes Appendectomy; Back surgery; Surgery of lip; and LEFT HEART CATH AND CORONARY ANGIOGRAPHY (N/A, 06/27/2017). Family: family history includes CVA in his father; Hypertension in his father.  Laboratory Chemistry Profile   Renal Lab Results  Component Value Date   BUN 31 (H) 04/27/2019   CREATININE 1.66 (H) 04/27/2019   GFRAA 44 (L) 04/27/2019   GFRNONAA 38 (L) 04/27/2019     Hepatic Lab Results  Component Value Date   AST 20 12/29/2018   ALT 18 12/29/2018   ALBUMIN 3.0 (L) 12/29/2018   ALKPHOS 69 12/29/2018     Electrolytes Lab Results  Component Value Date   NA 137 04/27/2019   K 6.3 (HH) 04/27/2019   CL 111 04/27/2019   CALCIUM 8.9 04/27/2019   MG 1.9 12/31/2018     Bone No results found for: VD25OH, [id number], WC5852DP8, EU2353IR4, 25OHVITD1, 25OHVITD2, 25OHVITD3, TESTOFREE, TESTOSTERONE   Inflammation (CRP: Acute Phase) (ESR: Chronic Phase) Lab Results  Component Value Date   CRP 2.4 (H) 11/20/2018   ESRSEDRATE 72 (H) 11/20/2018   LATICACIDVEN 1.7 09/09/2017       Note: Above Lab results reviewed.  Recent  Imaging Review  DG PAIN CLINIC C-ARM 1-60 MIN NO REPORT Fluoro was used, but no Radiologist interpretation will be provided.  Please refer to "NOTES" tab for provider progress note. Note: Reviewed        Physical Exam  General appearance: Well nourished, well developed, and well hydrated. In no apparent acute distress Mental status: Alert, oriented x 3 (person, place, & time)       Respiratory: No evidence of acute respiratory distress Eyes: PERLA Vitals: BP (!) 152/71   Pulse 65   Temp (!) 97.1 F (36.2 C)   Ht '5\' 10"'  (1.778 m)   Wt 232 lb (105.2 kg)   SpO2 98%   BMI 33.29 kg/m  BMI: Estimated body mass index is 33.29 kg/m as calculated from the following:   Height as of this encounter: '5\' 10"'  (1.778 m).   Weight as of this encounter: 232 lb (105.2 kg). Ideal: Ideal body weight: 73 kg (160  lb 15 oz) Adjusted ideal body weight: 85.9 kg (189 lb 5.8 oz)   Cervical Spine Exam  Skin & Axial Inspection: Well healed scar from previous spine surgery detected Alignment: Symmetrical Functional ROM: Pain restricted ROM      Stability: No instability detected Muscle Tone/Strength: Functionally intact. No obvious neuro-muscular anomalies detected. Sensory (Neurological): Dermatomal pain pattern Palpation: No palpable anomalies                    Upper Extremity (UE) Exam    Side: Right upper extremity  Side: Left upper extremity   Skin & Extremity Inspection: Skin color, temperature, and hair growth are WNL. No peripheral edema or cyanosis. No masses, redness, swelling, asymmetry, or associated skin lesions. No contractures.  Skin & Extremity Inspection: Skin color, temperature, and hair growth are WNL. No peripheral edema or cyanosis. No masses, redness, swelling, asymmetry, or associated skin lesions. No contractures.   Functional ROM: Unrestricted ROM          Functional ROM:  Improved after injection         Muscle Tone/Strength: Functionally intact. No obvious neuro-muscular  anomalies detected.  Muscle Tone/Strength: Functionally intact. No obvious neuro-muscular anomalies detected.   Sensory (Neurological): Unimpaired          Sensory (Neurological):  Improved after injection           Palpation: No palpable anomalies              Palpation: No palpable anomalies               Provocative Test(s):  Phalen's test: deferred Tinel's test: deferred Apley's scratch test (touch opposite shoulder):  Action 1 (Across chest): deferred Action 2 (Overhead): deferred Action 3 (LB reach): deferred   Provocative Test(s):  Phalen's test: deferred Tinel's test: deferred Apley's scratch test (touch opposite shoulder):  Action 1 (Across chest):  Improved Action 2 (Overhead):  Improved Action 3 (LB reach):  Improved     Thoracic Spine Area Exam  Skin & Axial Inspection: No masses, redness, or swelling Alignment: Symmetrical Functional ROM: Unrestricted ROM Stability: No instability detected Muscle Tone/Strength: Functionally intact. No obvious neuro-muscular anomalies detected. Sensory (Neurological): Unimpaired Muscle strength & Tone: No palpable anomalies  Lumbar Exam  Skin & Axial Inspection: Well healed scar from previous spine surgery detected Alignment: Symmetrical Functional ROM: Pain restricted ROM affecting both sides Stability: No instability detected Muscle Tone/Strength: Functionally intact. No obvious neuro-muscular anomalies detected. Sensory (Neurological): Dermatomal pain pattern Palpation: No palpable anomalies       Provocative Tests: Hyperextension/rotation test: deferred today       Lumbar quadrant test (Kemp's test): (+) bilateral for foraminal stenosis Lateral bending test: (+) ipsilateral radicular pain, bilaterally. Positive for bilateral foraminal stenosis. Patrick's Maneuver: deferred today                   FABER* test: deferred today                   S-I anterior distraction/compression test: deferred today         S-I  lateral compression test: deferred today         S-I Thigh-thrust test: deferred today         S-I Gaenslen's test: deferred today         *(Flexion, ABduction and External Rotation)  Gait & Posture Assessment  Ambulation: Patient ambulates using a cane Gait: Antalgic Posture: Difficulty standing up straight, due to pain  Lower Extremity Exam    Side: Right lower extremity  Side: Left lower extremity  Stability: No instability observed          Stability: No instability observed          Skin & Extremity Inspection: Skin color, temperature, and hair growth are WNL. No peripheral edema or cyanosis. No masses, redness, swelling, asymmetry, or associated skin lesions. No contractures.  Skin & Extremity Inspection: Skin color, temperature, and hair growth are WNL. No peripheral edema or cyanosis. No masses, redness, swelling, asymmetry, or associated skin lesions. No contractures.  Functional ROM: Pain restricted ROM for all joints of the lower extremity          Functional ROM: Pain restricted ROM for all joints of the lower extremity          Muscle Tone/Strength: Functionally intact. No obvious neuro-muscular anomalies detected.  Muscle Tone/Strength: Functionally intact. No obvious neuro-muscular anomalies detected.  Sensory (Neurological): Dermatomal pain pattern        Sensory (Neurological): Dermatomal pain pattern        DTR: Patellar: deferred today Achilles: deferred today Plantar: deferred today  DTR: Patellar: deferred today Achilles: deferred today Plantar: deferred today  Palpation: No palpable anomalies  Palpation: No palpable anomalies     Assessment   Status Diagnosis  Controlled Having a Flare-up Having a Flare-up 1. Primary osteoarthritis of left shoulder   2. Chronic radicular lumbar pain   3. Lumbar radiculopathy   4. Chronic pain syndrome   5. Chronic left shoulder pain      Updated Problems: Problem  Primary Osteoarthritis of Left Shoulder   Pain Management Contract Signed  Chronic Left Shoulder Pain   Shoulder osteoarthritis status post left anterior glenohumeral joint steroid injection on 10/09/2019.  Helped significantly.   Lumbar Radiculopathy   History of 4 lumbar spine surgeries.  midline L2-L3 lumbar epidural steroid injection performed on 08/26/2019.  This has provided the patient with significant pain relief in regards to his lumbar radicular pain symptoms.  He states that he is noticing approximately 80% pain relief and improvement in range of motion and functional status.  Repeat lumbar epidural steroid injection as needed   Lumbar Facet Arthropathy  Spinal Stenosis, Lumbar Region, With Neurogenic Claudication  History of Lumbar Fusion  Chronic Radicular Lumbar Pain  Cervical Facet Joint Syndrome  S/P Cervical Spinal Fusion  Ckd (Chronic Kidney Disease), Stage III  Ddd (Degenerative Disc Disease), Cervical    Plan of Care  Michael Cobb has a current medication list which includes the following long-term medication(s): amlodipine, cetirizine, escitalopram, ferrous sulfate, fluticasone, furosemide, glipizide, [START ON 11/29/2019] hydrocodone-acetaminophen, [START ON 12/29/2019] hydrocodone-acetaminophen, [START ON 01/28/2020] hydrocodone-acetaminophen, isosorbide mononitrate, ramipril, gabapentin, glipizide, and potassium chloride sa.  Pharmacotherapy (Medications Ordered): Meds ordered this encounter  Medications  . HYDROcodone-acetaminophen (NORCO/VICODIN) 5-325 MG tablet    Sig: Take 1 tablet by mouth 2 (two) times daily as needed for severe pain. Must last 30 days.    Dispense:  60 tablet    Refill:  0    Chronic Pain. (STOP Act - Not applicable). Fill one day early if closed on scheduled refill date.  Marland Kitchen HYDROcodone-acetaminophen (NORCO/VICODIN) 5-325 MG tablet    Sig: Take 1 tablet by mouth 2 (two) times daily as needed for severe pain. Must last 30 days.    Dispense:  60 tablet    Refill:  0     Chronic Pain. (STOP Act - Not applicable). Fill one day  early if closed on scheduled refill date.  Marland Kitchen HYDROcodone-acetaminophen (NORCO/VICODIN) 5-325 MG tablet    Sig: Take 1 tablet by mouth 2 (two) times daily as needed for severe pain. Must last 30 days.    Dispense:  60 tablet    Refill:  0    Chronic Pain. (STOP Act - Not applicable). Fill one day early if closed on scheduled refill date.   Orders:  Orders Placed This Encounter  Procedures  . Lumbar Epidural Injection    Standing Status:   Future    Standing Expiration Date:   12/14/2019    Scheduling Instructions:     Procedure: Interlaminar Lumbar Epidural Steroid injection (LESI) L2-L3 with sedation           Laterality: Midline     Timeframe: ASAA    Order Specific Question:   Where will this procedure be performed?    Answer:   [facility]  . SHOULDER INJECTION    For shoulder pain.    Standing Status:   Standing    Number of Occurrences:   1    Standing Expiration Date:   11/13/2020    Scheduling Instructions:     Side: left     Sedation: Patient's choice.     TIMEFRAME: PRN procedure. (Mr. Troung will call when needed.)    Order Specific Question:   Where will this procedure be performed?    Answer:   [facility]    Comments:   [facility]   Follow-up plan:   Return in about 2 weeks (around 11/28/2019) for L2/3 ESI #2 with sedation.      Interventional management options:  Considering:   S/p L2/3 ESI 08/26/19: Helped significantly, repeat as needed.  Status post left anterior shoulder injection on 10/09/2019: helped significantly, repeat PRN      Recent Visits Date Type Provider Dept  10/09/19 Procedure visit Gillis Santa, MD [facility]  09/24/19 Telemedicine Gillis Santa, MD [facility]  08/26/19 Procedure visit Gillis Santa, MD [facility]  Showing recent visits within past 90 days and meeting all other requirements Today's Visits Date Type Provider Dept  11/14/19  Office Visit Gillis Santa, MD [facility]  Showing today's visits and meeting all other requirements Future Appointments No visits were found meeting these conditions. Showing future appointments within next 90 days and meeting all other requirements  I discussed the assessment and treatment plan with the patient. The patient was provided an opportunity to ask questions and all were answered. The patient agreed with the plan and demonstrated an understanding of the instructions.  Patient advised to call back or seek an in-person evaluation if the symptoms or condition worsens.  Duration of encounter: 30 minutes.  Note by: Gillis Santa, MD Date: 11/14/2019; Time: 10:43 AM

## 2019-11-14 NOTE — Patient Instructions (Signed)

## 2019-11-21 ENCOUNTER — Other Ambulatory Visit: Payer: Self-pay | Admitting: Cardiology

## 2020-01-06 ENCOUNTER — Other Ambulatory Visit: Payer: Self-pay

## 2020-01-06 ENCOUNTER — Ambulatory Visit
Admission: RE | Admit: 2020-01-06 | Discharge: 2020-01-06 | Disposition: A | Discharge status: Home / Self Care | Payer: Medicare Other | Source: Ambulatory Visit | Attending: Student in an Organized Health Care Education/Training Program | Admitting: Student in an Organized Health Care Education/Training Program

## 2020-01-06 ENCOUNTER — Ambulatory Visit (HOSPITAL_BASED_OUTPATIENT_CLINIC_OR_DEPARTMENT_OTHER): Payer: Medicare Other | Admitting: Student in an Organized Health Care Education/Training Program

## 2020-01-06 ENCOUNTER — Telehealth: Payer: Self-pay

## 2020-01-06 ENCOUNTER — Encounter: Payer: Self-pay | Admitting: Student in an Organized Health Care Education/Training Program

## 2020-01-06 VITALS — BP 134/63 | HR 58 | Temp 97.4°F | Resp 18 | Ht 70.0 in | Wt 232.0 lb

## 2020-01-06 DIAGNOSIS — G894 Chronic pain syndrome: Secondary | ICD-10-CM | POA: Insufficient documentation

## 2020-01-06 DIAGNOSIS — G8929 Other chronic pain: Secondary | ICD-10-CM | POA: Diagnosis not present

## 2020-01-06 DIAGNOSIS — M5416 Radiculopathy, lumbar region: Secondary | ICD-10-CM

## 2020-01-06 MED ORDER — LIDOCAINE HCL 2 % IJ SOLN
INTRAMUSCULAR | Status: AC
  Filled 2020-01-06: qty 20

## 2020-01-06 MED ORDER — DEXAMETHASONE SODIUM PHOSPHATE 10 MG/ML IJ SOLN
INTRAMUSCULAR | Status: AC
  Filled 2020-01-06: qty 1

## 2020-01-06 MED ORDER — IOHEXOL 180 MG/ML  SOLN
10.0000 mL | Freq: Once | INTRAMUSCULAR | Status: AC
  Administered 2020-01-06: 10 mL via EPIDURAL

## 2020-01-06 MED ORDER — FENTANYL CITRATE (PF) 100 MCG/2ML IJ SOLN
25.0000 ug | INTRAMUSCULAR | Status: DC | PRN
  Administered 2020-01-06: 25 ug via INTRAVENOUS

## 2020-01-06 MED ORDER — LIDOCAINE HCL 2 % IJ SOLN
20.0000 mL | Freq: Once | INTRAMUSCULAR | Status: AC
  Administered 2020-01-06: 400 mg

## 2020-01-06 MED ORDER — DEXAMETHASONE SODIUM PHOSPHATE 10 MG/ML IJ SOLN
10.0000 mg | Freq: Once | INTRAMUSCULAR | Status: AC
  Administered 2020-01-06: 10 mg

## 2020-01-06 MED ORDER — IOHEXOL 180 MG/ML  SOLN
INTRAMUSCULAR | Status: AC
  Filled 2020-01-06: qty 20

## 2020-01-06 MED ORDER — ROPIVACAINE HCL 2 MG/ML IJ SOLN
INTRAMUSCULAR | Status: AC
  Filled 2020-01-06: qty 10

## 2020-01-06 MED ORDER — ROPIVACAINE HCL 2 MG/ML IJ SOLN
2.0000 mL | Freq: Once | INTRAMUSCULAR | Status: AC
  Administered 2020-01-06: 10 mL via EPIDURAL

## 2020-01-06 MED ORDER — SODIUM CHLORIDE 0.9% FLUSH
2.0000 mL | Freq: Once | INTRAVENOUS | Status: AC
  Administered 2020-01-06: 10 mL

## 2020-01-06 MED ORDER — FENTANYL CITRATE (PF) 100 MCG/2ML IJ SOLN
INTRAMUSCULAR | Status: AC
  Filled 2020-01-06: qty 2

## 2020-01-06 MED ORDER — SODIUM CHLORIDE (PF) 0.9 % IJ SOLN
INTRAMUSCULAR | Status: AC
  Filled 2020-01-06: qty 10

## 2020-01-06 NOTE — Progress Notes (Signed)
Safety precautions to be maintained throughout the outpatient stay will include: orient to surroundings, keep bed in low position, maintain call bell within reach at all times, provide assistance with transfer out of bed and ambulation.  

## 2020-01-06 NOTE — Progress Notes (Signed)
PROVIDER NOTE: Information contained herein reflects review and annotations entered in association with encounter. Interpretation of such information and data should be left to medically-trained personnel. Information provided to patient can be located elsewhere in the medical record under "Patient Instructions". Document created using STT-dictation technology, any transcriptional errors that may result from process are unintentional.    Patient: Michael Cobb  Service Category: Procedure  Provider: Gillis Santa, MD  DOB: 1934-12-17  DOS: 01/06/2020  Location: [facility]  MRN: [id number]  Setting: Ambulatory - outpatient  Referring Provider: Leeanne Rio, MD  Type: Established Patient  Specialty: Interventional Pain Management  PCP: Leeanne Rio, MD   Primary Reason for Visit: Interventional Pain Management Treatment. CC: Back Pain (lumbar bilateral ) and Shoulder Pain (bilateral )  Procedure:          Anesthesia, Analgesia, Anxiolysis:  Type: Therapeutic Inter-Laminar Epidural Steroid Injection  #2  Region: Lumbar Level: L2-3 Level.  Laterality: Left-Sided         Type: Moderate (Conscious) Sedation combined with Local Anesthesia Indication(s): Analgesia and Anxiety Route: Intravenous (IV) IV Access: Secured Sedation: Meaningful verbal contact was maintained at all times during the procedure  Local Anesthetic: Lidocaine 1-2%  Position: Prone with head of the table was raised to facilitate breathing.   Indications: 1. Lumbar radiculopathy   2. Chronic radicular lumbar pain   3. Chronic pain syndrome    Pain Score: Pre-procedure: 8 /10 Post-procedure: 0-No pain/10   Pre-op Assessment:  Michael Cobb is a 85 y.o. (year old), male patient, seen today for interventional treatment. He  has a past surgical history that includes Appendectomy; Back surgery; Surgery of lip; and LEFT HEART CATH AND CORONARY ANGIOGRAPHY (N/A, 06/27/2017). Michael Cobb has a current  medication list which includes the following prescription(s): acetaminophen, amlodipine, aspirin ec, cetirizine, docusate sodium, empagliflozin, ergocalciferol, escitalopram, repatha sureclick, ferrous sulfate, fluticasone, furosemide, glipizide, hydrocodone-acetaminophen, [START ON 01/28/2020] hydrocodone-acetaminophen, isosorbide mononitrate, meclizine, omega-3 fatty acids, ramipril, tamsulosin, cyanocobalamin, cyanocobalamin, gabapentin, glipizide, hydrocodone-acetaminophen, and potassium chloride sa, and the following Facility-Administered Medications: fentanyl. His primarily concern today is the Back Pain (lumbar bilateral ) and Shoulder Pain (bilateral )  Initial Vital Signs:  Pulse/HCG Rate: (!) 58ECG Heart Rate: (!) 56 Temp: (!) 97.2 F (36.2 C) Resp: 16 BP: 137/61 SpO2: 100 %  BMI: Estimated body mass index is 33.29 kg/m as calculated from the following:   Height as of this encounter: 5\' 10"  (1.778 m).   Weight as of this encounter: 232 lb (105.2 kg).  Risk Assessment: Allergies: Reviewed. He is allergic to metformin and morphine and related.  Allergy Precautions: None required Coagulopathies: Reviewed. None identified.  Blood-thinner therapy: None at this time Active Infection(s): Reviewed. None identified. Michael Cobb is afebrile  Site Confirmation: Michael Cobb was asked to confirm the procedure and laterality before marking the site Procedure checklist: Completed Consent: Before the procedure and under the influence of no sedative(s), amnesic(s), or anxiolytics, the patient was informed of the treatment options, risks and possible complications. To fulfill our ethical and legal obligations, as recommended by the American Medical Association's Code of Ethics, I have informed the patient of my clinical impression; the nature and purpose of the treatment or procedure; the risks, benefits, and possible complications of the intervention; the alternatives, including doing nothing; the  risk(s) and benefit(s) of the alternative treatment(s) or procedure(s); and the risk(s) and benefit(s) of doing nothing. The patient was provided information about the general risks and possible complications associated with  the procedure. These may include, but are not limited to: failure to achieve desired goals, infection, bleeding, organ or nerve damage, allergic reactions, paralysis, and death. In addition, the patient was informed of those risks and complications associated to Spine-related procedures, such as failure to decrease pain; infection (i.e.: Meningitis, epidural or intraspinal abscess); bleeding (i.e.: epidural hematoma, subarachnoid hemorrhage, or any other type of intraspinal or peri-dural bleeding); organ or nerve damage (i.e.: Any type of peripheral nerve, nerve root, or spinal cord injury) with subsequent damage to sensory, motor, and/or autonomic systems, resulting in permanent pain, numbness, and/or weakness of one or several areas of the body; allergic reactions; (i.e.: anaphylactic reaction); and/or death. Furthermore, the patient was informed of those risks and complications associated with the medications. These include, but are not limited to: allergic reactions (i.e.: anaphylactic or anaphylactoid reaction(s)); adrenal axis suppression; blood sugar elevation that in diabetics may result in ketoacidosis or comma; water retention that in patients with history of congestive heart failure may result in shortness of breath, pulmonary edema, and decompensation with resultant heart failure; weight gain; swelling or edema; medication-induced neural toxicity; particulate matter embolism and blood vessel occlusion with resultant organ, and/or nervous system infarction; and/or aseptic necrosis of one or more joints. Finally, the patient was informed that Medicine is not an exact science; therefore, there is also the possibility of unforeseen or unpredictable risks and/or possible complications  that may result in a catastrophic outcome. The patient indicated having understood very clearly. We have given the patient no guarantees and we have made no promises. Enough time was given to the patient to ask questions, all of which were answered to the patient's satisfaction. Michael Cobb has indicated that he wanted to continue with the procedure. Attestation: I, the ordering provider, attest that I have discussed with the patient the benefits, risks, side-effects, alternatives, likelihood of achieving goals, and potential problems during recovery for the procedure that I have provided informed consent. Date  Time: 01/06/2020 10:24 AM  Pre-Procedure Preparation:  Monitoring: As per clinic protocol. Respiration, ETCO2, SpO2, BP, heart rate and rhythm monitor placed and checked for adequate function Safety Precautions: Patient was assessed for positional comfort and pressure points before starting the procedure. Time-out: I initiated and conducted the "Time-out" before starting the procedure, as per protocol. The patient was asked to participate by confirming the accuracy of the "Time Out" information. Verification of the correct person, site, and procedure were performed and confirmed by me, the nursing staff, and the patient. "Time-out" conducted as per Joint Commission's Universal Protocol (UP.01.01.01). Time: 1059  Description of Procedure:          Target Area: The interlaminar space, initially targeting the lower laminar border of the superior vertebral body. Approach: Paramedial approach. Area Prepped: Entire Posterior Lumbar Region DuraPrep (Iodine Povacrylex [0.7% available iodine] and Isopropyl Alcohol, 74% w/w) Safety Precautions: Aspiration looking for blood return was conducted prior to all injections. At no point did we inject any substances, as a needle was being advanced. No attempts were made at seeking any paresthesias. Safe injection practices and needle disposal techniques used.  Medications properly checked for expiration dates. SDV (single dose vial) medications used. Description of the Procedure: Protocol guidelines were followed. The procedure needle was introduced through the skin, ipsilateral to the reported pain, and advanced to the target area. Bone was contacted and the needle walked caudad, until the lamina was cleared. The epidural space was identified using "loss-of-resistance technique" with 2-3 ml of PF-NaCl (0.9% NSS),  in a 5cc LOR glass syringe.  Vitals:   01/06/20 1103 01/06/20 1106 01/06/20 1115 01/06/20 1125  BP: (!) 142/66 (!) 144/67 138/61 134/63  Pulse:      Resp: 14 15 16 18   Temp:   (!) 97.4 F (36.3 C)   TempSrc:      SpO2: 98% 100% 99% 99%  Weight:      Height:        Start Time: 1100 hrs. End Time: 1106 hrs.  Materials:  Needle(s) Type: Epidural needle Gauge: 22G Length: 3.5-in Medication(s): Please see orders for medications and dosing details. 7 cc solution made of 4 cc of preservative-free saline, 2 cc of 0.2% ropivacaine, 1 cc of Decadron 10 mg/cc.  Imaging Guidance (Spinal):          Type of Imaging Technique: Fluoroscopy Guidance (Spinal) Indication(s): Assistance in needle guidance and placement for procedures requiring needle placement in or near specific anatomical locations not easily accessible without such assistance. Exposure Time: Please see nurses notes. Contrast: Before injecting any contrast, we confirmed that the patient did not have an allergy to iodine, shellfish, or radiological contrast. Once satisfactory needle placement was completed at the desired level, radiological contrast was injected. Contrast injected under live fluoroscopy. No contrast complications. See chart for type and volume of contrast used. Fluoroscopic Guidance: I was personally present during the use of fluoroscopy. "Tunnel Vision Technique" used to obtain the best possible view of the target area. Parallax error corrected before commencing the  procedure. "Direction-depth-direction" technique used to introduce the needle under continuous pulsed fluoroscopy. Once target was reached, antero-posterior, oblique, and lateral fluoroscopic projection used confirm needle placement in all planes. Images permanently stored in EMR. Interpretation: I personally interpreted the imaging intraoperatively. Adequate needle placement confirmed in multiple planes. Appropriate spread of contrast into desired area was observed. No evidence of afferent or efferent intravascular uptake. No intrathecal or subarachnoid spread observed. Permanent images saved into the patient's record.  Antibiotic Prophylaxis:   Anti-infectives (From admission, onward)   None     Indication(s): None identified  Post-operative Assessment:  Post-procedure Vital Signs:  Pulse/HCG Rate: (!) 5863 Temp: (!) 97.4 F (36.3 C) Resp: 18 BP: 134/63 SpO2: 99 %  EBL: None  Complications: None.  Note: In the recovery room, patient was noted to have mild to moderate weakness of lower extremity.  He was sitting upright.  No headache, vision changes.  Vital signs stable.  Due to increased volume of epidural local anesthetic, patient is likely experiencing lower extremity motor weakness.  He was kept in the recovery room for 1 hour.  Afterwards, he was evaluated again and was noted to have return of lower extremity strength with adequate plantarflexion, hip flexion, knee extension.  Patient given instructions.  We will plan on calling him later this afternoon to ensure that he is doing okay. The patient was provided with post-procedure discharge instructions, including a section on how to identify potential problems. Should any problems arise concerning this procedure, the patient was given instructions to immediately contact us, at any time, without hesitation. In any case, we plan to contact the patient by telephone for a follow-up status report regarding this interventional  procedure.  Comments:  No additional relevant information.  Plan of Care  Orders:  Orders Placed This Encounter  Procedures  . [facility] PA[facility] C-ARM 1-60 MIN NO REPORT    Intraoperative interpretation by procedural physician at [place].    Standing Status:   Standing  Number of Occurrences:   1    Order Specific Question:   Reason for exam:    Answer:   Assistance in needle guidance and placement for procedures requiring needle placement in or near specific anatomical locations not easily accessible without such assistance.    Medications ordered for procedure: Meds ordered this encounter  Medications  . iohexol (OMNIPAQUE) 180 MG/ML injection 10 mL    Must be Myelogram-compatible. If not available, you may substitute with a water-soluble, non-ionic, hypoallergenic, myelogram-compatible radiological contrast medium.  Marland Kitchen lidocaine (XYLOCAINE) 2 % (with pres) injection 400 mg  . fentaNYL (SUBLIMAZE) injection 25-50 mcg    Make sure Narcan is available in the pyxis when using this medication. In the event of respiratory depression (RR< 8/min): Titrate NARCAN (naloxone) in increments of 0.1 to 0.2 mg IV at 2-3 minute intervals, until desired degree of reversal.  . ropivacaine (PF) 2 mg/mL (0.2%) (NAROPIN) injection 2 mL  . sodium chloride flush (NS) 0.9 % injection 2 mL  . dexamethasone (DECADRON) injection 10 mg   Medications administered: We administered iohexol, lidocaine, fentaNYL, ropivacaine (PF) 2 mg/mL (0.2%), sodium chloride flush, and dexamethasone.  See the medical record for exact dosing, route, and time of administration.  Follow-up plan:   Return in about 4 weeks (around 02/03/2020) for Post Procedure Evaluation, virtual.       Interventional management options:  Considering:   S/p L2/3 ESI 08/26/19, 01/06/20    Recent Visits Date Type Provider Dept  11/14/19 Office Visit Gillis Santa, MD [facility]  10/09/19 Procedure visit Gillis Santa, MD [facility]  Showing recent visits within past 90 days and meeting all other requirements Today's Visits Date Type Provider Dept  01/06/20 Procedure visit Gillis Santa, MD [facility]  Showing today's visits and meeting all other requirements Future Appointments Date Type Provider Dept  01/27/20 Appointment Gillis Santa, MD [facility]  02/20/20 Appointment Gillis Santa, MD [facility]  Showing future appointments within next 90 days and meeting all other requirements  Disposition: Discharge home  Discharge (Date  Time): 01/06/2020; 1202 hrs.   Primary Care Physician: Leeanne Rio, MD Location: [facility] Note by: Gillis Santa, MD Date: 01/06/2020; Time: 12:12 PM  Disclaimer:  Medicine is not an exact science. The only guarantee in medicine is that nothing is guaranteed. It is important to note that the decision to proceed with this intervention was based on the information collected from the patient. The Data and conclusions were drawn from the patient's questionnaire, the interview, and the physical examination. Because the information was provided in large part by the patient, it cannot be guaranteed that it has not been purposely or unconsciously manipulated. Every effort has been made to obtain as much relevant data as possible for this evaluation. It is important to note that the conclusions that lead to this procedure are derived in large part from the available data. Always take into account that the treatment will also be dependent on availability of resources and existing treatment guidelines, considered by other Pain Management Practitioners as being common knowledge and practice, at the time of the intervention. For Medico-Legal purposes, it is also important to point out that variation in procedural techniques and pharmacological choices are the acceptable norm. The indications, contraindications,  technique, and results of the above procedure should only be interpreted and judged by a Board-Certified Interventional Pain Specialist with extensive familiarity and expertise in the same exact procedure and  technique.

## 2020-01-06 NOTE — Patient Instructions (Signed)
____________________________________________________________________________________________  Post-Procedure Discharge Instructions  Instructions:  Apply ice:   Purpose: This will minimize any swelling and discomfort after procedure.   When: Day of procedure, as soon as you get home.  How: Fill a plastic sandwich bag with crushed ice. Cover it with a small towel and apply to injection site.  How long: (15 min on, 15 min off) Apply for 15 minutes then remove x 15 minutes.  Repeat sequence on day of procedure, until you go to bed.  Apply heat:   Purpose: To treat any soreness and discomfort from the procedure.  When: Starting the next day after the procedure.  How: Apply heat to procedure site starting the day following the procedure.  How long: May continue to repeat daily, until discomfort goes away.  Food intake: Start with clear liquids (like water) and advance to regular food, as tolerated.   Physical activities: Keep activities to a minimum for the first 8 hours after the procedure. After that, then as tolerated.  Driving: If you have received any sedation, be responsible and do not drive. You are not allowed to drive for 24 hours after having sedation.  Blood thinner: (Applies only to those taking blood thinners) You may restart your blood thinner 6 hours after your procedure.  Insulin: (Applies only to Diabetic patients taking insulin) As soon as you can eat, you may resume your normal dosing schedule.  Infection prevention: Keep procedure site clean and dry. Shower daily and clean area with soap and water.  Post-procedure Pain Diary: Extremely important that this be done correctly and accurately. Recorded information will be used to determine the next step in treatment. For the purpose of accuracy, follow these rules:  Evaluate only the area treated. Do not report or include pain from an untreated area. For the purpose of this evaluation, ignore all other areas of pain,  except for the treated area.  After your procedure, avoid taking a long nap and attempting to complete the pain diary after you wake up. Instead, set your alarm clock to go off every hour, on the hour, for the initial 8 hours after the procedure. Document the duration of the numbing medicine, and the relief you are getting from it.  Do not go to sleep and attempt to complete it later. It will not be accurate. If you received sedation, it is likely that you were given a medication that may cause amnesia. Because of this, completing the diary at a later time may cause the information to be inaccurate. This information is needed to plan your care.  Follow-up appointment: Keep your post-procedure follow-up evaluation appointment after the procedure (usually 2 weeks for most procedures, 6 weeks for radiofrequencies). DO NOT FORGET to bring you pain diary with you.   Expect: (What should I expect to see with my procedure?)  From numbing medicine (AKA: Local Anesthetics): Numbness or decrease in pain. You may also experience some weakness, which if present, could last for the duration of the local anesthetic.  Onset: Full effect within 15 minutes of injected.  Duration: It will depend on the type of local anesthetic used. On the average, 1 to 8 hours.   From steroids (Applies only if steroids were used): Decrease in swelling or inflammation. Once inflammation is improved, relief of the pain will follow.  Onset of benefits: Depends on the amount of swelling present. The more swelling, the longer it will take for the benefits to be seen. In some cases, up to 10 days.    Duration: Steroids will stay in the system x 2 weeks. Duration of benefits will depend on multiple posibilities including persistent irritating factors.  Side-effects: If present, they may typically last 2 weeks (the duration of the steroids).  Frequent: Cramps (if they occur, drink Gatorade and take over-the-counter Magnesium 450-500 mg  once to twice a day); water retention with temporary weight gain; increases in blood sugar; decreased immune system response; increased appetite.  Occasional: Facial flushing (red, warm cheeks); mood swings; menstrual changes.  Uncommon: Long-term decrease or suppression of natural hormones; bone thinning. (These are more common with higher doses or more frequent use. This is why we prefer that our patients avoid having any injection therapies in other practices.)   Very Rare: Severe mood changes; psychosis; aseptic necrosis.  From procedure: Some discomfort is to be expected once the numbing medicine wears off. This should be minimal if ice and heat are applied as instructed.  Call if: (When should I call?)  You experience numbness and weakness that gets worse with time, as opposed to wearing off.  New onset bowel or bladder incontinence. (Applies only to procedures done in the spine)  Emergency Numbers:  Durning business hours (Monday - Thursday, 8:00 AM - 4:00 PM) (Friday, 9:00 AM - 12:00 Noon): [phone]  After hours: [phone]  NOTE: If you are having a problem and are unable connect with, or to talk to a provider, then go to your nearest urgent care or emergency department. If the problem is serious and urgent, please call 911. ____________________________________________________________________________________________   Epidural Steroid Injection  An epidural steroid injection is a shot of steroid medicine and numbing medicine that is given into the space between the spinal cord and the bones of the back (epidural space). The shot helps relieve pain caused by an irritated or swollen nerve root. The amount of pain relief you get from the injection depends on what is causing the nerve to be swollen and irritated, and how long your pain lasts. You are more likely to benefit from this injection if your pain is strong and comes on suddenly rather than if you have had  long-term (chronic) pain. Tell a health care provider about:  Any allergies you have.  All medicines you are taking, including vitamins, herbs, eye drops, creams, and over-the-counter medicines.  Any problems you or family members have had with anesthetic medicines.  Any blood disorders you have.  Any surgeries you have had.  Any medical conditions you have.  Whether you are pregnant or may be pregnant. What are the risks? Generally, this is a safe procedure. However, problems may occur, including:  Headache.  Bleeding.  Infection.  Allergic reaction to medicines.  Nerve damage. What happens before the procedure? Staying hydrated Follow instructions from your health care provider about hydration, which may include:  Up to 2 hours before the procedure - you may continue to drink clear liquids, such as water, clear fruit juice, black coffee, and plain tea. Eating and drinking restrictions Follow instructions from your health care provider about eating and drinking, which may include:  8 hours before the procedure - stop eating heavy meals or foods, such as meat, fried foods, or fatty foods.  6 hours before the procedure - stop eating light meals or foods, such as toast or cereal.  6 hours before the procedure - stop drinking milk or drinks that contain milk.  2 hours before the procedure - stop drinking clear liquids. Medicines  You may be given  medicines to lower anxiety.  Ask your health care provider about: ? Changing or stopping your regular medicines. This is especially important if you are taking diabetes medicines or blood thinners. ? Taking medicines such as aspirin and ibuprofen. These medicines can thin your blood. Do not take these medicines unless your health care provider tells you to take them. ? Taking over-the-counter medicines, vitamins, herbs, and supplements.  Ask your health care provider what steps will be taken to prevent infection. General  instructions  Plan to have someone take you home from the hospital or clinic.  If you will be going home right after the procedure, plan to have someone with you for 24 hours. What happens during the procedure?  An IV will be inserted into one of your veins.  You will be given one or more of the following: ? A medicine to help you relax (sedative). ? A medicine to numb the area (local anesthetic).  You will be asked to lie on your abdomen or sit.  The injection site will be cleaned.  A needle will be inserted through your skin into the epidural space. This may cause you some discomfort. An X-ray machine will be used to guide the needle as close as possible to the affected nerve.  A steroid medicine and a local anesthetic will be injected into the epidural space.  The needle and IV will be removed.  A bandage (dressing) will be put over the injection site. The procedure may vary among health care providers and hospitals. What can I expect after the procedure? Follow these instructions at home: Injection site care  You may remove the bandage (dressing) after 24 hours.  Check your injection site every day for signs of infection. Check for: ? Redness, swelling, or pain. ? Fluid or blood. ? Warmth. ? Pus or a bad smell. Managing pain, stiffness, and swelling  For 24 hours after the procedure: ? Avoid using heat on the injection site. ? Do not take baths, swim, or use a hot tub until your health care provider approves. Ask your health care provider if you may take a shower. You may only be allowed to take sponge baths.  If directed, put ice on the injection site. To do this: ? Put ice in a plastic bag. ? Place a towel between your skin and the bag. ? Leave the ice on for 20 minutes, 2-3 times a day.  Activity  Do not drive for 24 hours if you were given a sedative during your procedure.  Return to your normal activities as told by your health care provider. Ask your  health care provider what activities are safe for you. General instructions  Your blood pressure, heart rate, breathing rate, and blood oxygen level will be monitored until you leave the hospital or clinic.  Your arm or leg may feel weak or numb for a few hours.  The injection site may feel sore.  Take over-the-counter and prescription medicines only as told by your health care provider.  Drink enough fluid to keep your urine pale yellow.  Keep all follow-up visits as told by your health care provider. This is important. Contact a health care provider if:  You have any of these signs of infection: ? Redness, swelling, or pain around your injection site. ? Fluid or blood coming from your injection site. ? Warmth coming from your injection site. ? Pus or a bad smell coming from your injection site. ? A fever.  You continue to  have pain and soreness around the injection site, even after taking over-the-counter pain medicine.  You have severe, sudden, or lasting nausea or vomiting. Get help right away if:  You have severe pain at the injection site that is not relieved by medicines.  You develop a severe headache or a stiff neck.  You become sensitive to light.  You have any new numbness or weakness in your legs or arms.  You lose control of your bladder or bowel movements.  You have trouble breathing. Summary  An epidural steroid injection is a shot of steroid medicine and numbing medicine that is given into the epidural space.  The shot helps relieve pain caused by an irritated or swollen nerve root.  You are more likely to benefit from this injection if your pain is strong and comes on suddenly rather than if you have had chronic pain. This information is not intended to replace advice given to you by your health care provider. Make sure you discuss any questions you have with your health care provider. Document Revised: 09/03/2018 Document Reviewed: 09/03/2018 Elsevier  Patient Education  [place].

## 2020-01-06 NOTE — Progress Notes (Signed)
1130 When getting pt up to chair,  Patient unable to stand, Md in to eval pt. Will continue to monitor. 1159 assessment performed by MD- motor skills stronger, no headache, ok for patient to go home and will call in a few hours

## 2020-01-06 NOTE — Telephone Encounter (Signed)
Called Michael Cobb and man at his house stated that he did ok and that the feeling was back in his legs. He stated that on the way home they stopped and Michael Danis walked in and around the [facility]. Denies any needs at this time.

## 2020-01-07 ENCOUNTER — Telehealth: Payer: Self-pay | Admitting: *Deleted

## 2020-01-07 NOTE — Telephone Encounter (Signed)
No problems post procedure. 

## 2020-01-23 ENCOUNTER — Telehealth: Payer: Self-pay

## 2020-01-23 NOTE — Telephone Encounter (Signed)
Called to confirm appt. For Monday VV at 330. No answer, instructed to call us back to review last procedure LESI on 01/06/20

## 2020-01-27 ENCOUNTER — Encounter: Payer: Self-pay | Admitting: Student in an Organized Health Care Education/Training Program

## 2020-01-27 ENCOUNTER — Other Ambulatory Visit: Payer: Self-pay

## 2020-01-27 ENCOUNTER — Ambulatory Visit
Payer: Medicare Other | Attending: Student in an Organized Health Care Education/Training Program | Admitting: Student in an Organized Health Care Education/Training Program

## 2020-01-27 DIAGNOSIS — Z981 Arthrodesis status: Secondary | ICD-10-CM

## 2020-01-27 DIAGNOSIS — M5416 Radiculopathy, lumbar region: Secondary | ICD-10-CM | POA: Diagnosis not present

## 2020-01-27 DIAGNOSIS — M48062 Spinal stenosis, lumbar region with neurogenic claudication: Secondary | ICD-10-CM | POA: Diagnosis not present

## 2020-01-27 DIAGNOSIS — G8929 Other chronic pain: Secondary | ICD-10-CM

## 2020-01-27 DIAGNOSIS — G894 Chronic pain syndrome: Secondary | ICD-10-CM | POA: Insufficient documentation

## 2020-01-27 DIAGNOSIS — M25552 Pain in left hip: Secondary | ICD-10-CM | POA: Diagnosis not present

## 2020-01-27 NOTE — Progress Notes (Signed)
Patient: Michael Cobb  Service Category: E/M  Provider: Gillis Santa, MD  DOB: 1934/11/22  DOS: 01/27/2020  Location: Office  MRN: [id number]  Setting: Ambulatory outpatient  Referring Provider: Leeanne Rio, MD  Type: Established Patient  Specialty: Interventional Pain Management  PCP: Leeanne Rio, MD  Location: Home  Delivery: TeleHealth     Virtual Encounter - Pain Management PROVIDER NOTE: Information contained herein reflects review and annotations entered in association with encounter. Interpretation of such information and data should be left to medically-trained personnel. Information provided to patient can be located elsewhere in the medical record under "Patient Instructions". Document created using STT-dictation technology, any transcriptional errors that may result from process are unintentional.    Contact & Pharmacy Preferred: [phone] Home: [phone] (home) Mobile: There is no such number on file (mobile). E-mail: No e-mail address on record  [place], [place] - [place] [address] [place] [address] Phone: [phone] Fax: [phone]   Pre-screening  Mr. Creech offered "in-person" vs "virtual" encounter. He indicated preferring virtual for this encounter.   Reason COVID-19*  Social distancing based on CDC and AMA recommendations.   I contacted CURTISS MAHMOOD on 01/27/2020 via telephone.      I clearly identified myself as Gillis Santa, MD. I verified that I was speaking with the correct person using two identifiers (Name: DARRALL STREY, and date of birth: May 08, 1934).  Consent I sought verbal advanced consent from [name] for virtual visit interactions. I informed Mr. Carcione of possible security and privacy concerns, risks, and limitations associated with providing "not-in-person" medical evaluation and management services. I also informed Mr. Dames of the availability of "in-person" appointments. Finally, I  informed him that there would be a charge for the virtual visit and that he could be  personally, fully or partially, financially responsible for it. Mr. Toran expressed understanding and agreed to proceed.   Historic Elements   Mr. FADEL CLASON is a 85 y.o. year old, male patient evaluated today after our last contact on 01/06/2020. Mr. Duris  has a past medical history of BPH (benign prostatic hyperplasia), Cancer ([place]), Chronic renal insufficiency, stage 3 (moderate) ([place]), Hyperlipidemia, Hypertension, Non-ST elevated myocardial infarction (non-STEMI) ([place]), Syncope, and Type 2 diabetes mellitus ([place]). He also  has a past surgical history that includes Appendectomy; Back surgery; Surgery of lip; and LEFT HEART CATH AND CORONARY ANGIOGRAPHY (N/A, 06/27/2017). Mr. Florek has a current medication list which includes the following prescription(s): acetaminophen, amlodipine, aspirin ec, cetirizine, docusate sodium, empagliflozin, ergocalciferol, escitalopram, repatha sureclick, ferrous sulfate, fluticasone, furosemide, glipizide, hydrocodone-acetaminophen, [START ON 01/28/2020] hydrocodone-acetaminophen, isosorbide mononitrate, meclizine, omega-3 fatty acids, potassium chloride sa, ramipril, tamsulosin, cyanocobalamin, cyanocobalamin, gabapentin, glipizide, and hydrocodone-acetaminophen. He  reports that he has never smoked. He has never used smokeless tobacco. He reports that he does not drink alcohol and does not use drugs. Mr. Rathe is allergic to metformin and morphine and related.   HPI  Today, he is being contacted for a post-procedure assessment.    Post-Procedure Evaluation  Procedure (01/06/2020):  Type: Therapeutic Inter-Laminar Epidural Steroid Injection  #2  Region: Lumbar Level: L2-3 Level.  Laterality: Left-Sided   Sedation: Please see nurses note.  Effectiveness during initial hour after procedure(Ultra-Short Term Relief): 100 %   Local anesthetic used: Long-acting (4-6 hours)  Effectiveness: Defined as any analgesic benefit obtained secondary to the administration of local anesthetics. This carries significant diagnostic value as to the etiological location, or anatomical origin,  of the pain. Duration of benefit is expected to coincide with the duration of the local anesthetic used.  Effectiveness during initial 4-6 hours after procedure(Short-Term Relief): 100 %   Long-term benefit: Defined as any relief past the pharmacologic duration of the local anesthetics.  Effectiveness past the initial 6 hours after procedure(Long-Term Relief): 75 %   Current benefits: Defined as benefit that persist at this time.   Analgesia:  50% improved Function: Somewhat improved ROM: Somewhat improved  Patient endorses significant pain relief after left L2-L3 ESI #2.  States that he is able to bear weight and has less radiating pain when he is ambulating.  Is having more pain in his left hip with weightbearing.  Is requesting a left hip steroid injection. Pharmacotherapy Assessment  Analgesic: Hydrocodone 5 mg twice daily as needed, quantity 60/month MME equals 10   Monitoring: [place] PMP: PDMP reviewed during this encounter.       Pharmacotherapy: No side-effects or adverse reactions reported. Compliance: No problems identified. Effectiveness: Clinically acceptable. Plan: Refer to "POC".  UDS:  Summary  Date Value Ref Range Status  04/24/2019 Note  Final    Comment:    ==================================================================== Compliance Drug Analysis, Ur ==================================================================== Test                             Result       Flag       Units Drug Present and Declared for Prescription Verification   Acetaminophen                  PRESENT      EXPECTED   Guaifenesin                    PRESENT      EXPECTED    Guaifenesin may be administered as an over-the-counter or    prescription drug; it may also be present as a breakdown  product of    methocarbamol. Drug Absent but Declared for Prescription Verification   Gabapentin                     Not Detected UNEXPECTED   Salicylate                     Not Detected UNEXPECTED    Aspirin, as indicated in the declared medication list, is not always    detected even when used as directed.   Dextromethorphan               Not Detected UNEXPECTED ==================================================================== Test                      Result    Flag   Units      Ref Range   Creatinine              75               mg/dL      >=20 ==================================================================== Declared Medications:  The flagging and interpretation on this report are based on the  following declared medications.  Unexpected results may arise from  inaccuracies in the declared medications.  **Note: The testing scope of this panel includes these medications:  Dextromethorphan (Mucinex DM)  Gabapentin  Guaifenesin (Mucinex DM)  **Note: The testing scope of this panel does not include small to  moderate amounts of these reported medications:  Acetaminophen  Aspirin  **  Note: The testing scope of this panel does not include the  following reported medications:  Allopurinol  Amlodipine  Cetirizine  Cyanocobalamin  Docusate  Evolocumab  Fluticasone  Furosemide  Glipizide  Iron  Isosorbide (Imdur)  Meclizine  Omega-3 Fatty Acids  Potassium ([place])  Ramipril  Tamsulosin ==================================================================== For clinical consultation, please call [phone]. ====================================================================     Laboratory Chemistry Profile   Renal Lab Results  Component Value Date   BUN 31 (H) 04/27/2019   CREATININE 1.66 (H) 04/27/2019   GFRAA 44 (L) 04/27/2019   GFRNONAA 38 (L) 04/27/2019     Hepatic Lab Results  Component Value Date   AST 20 12/29/2018   ALT 18 12/29/2018    ALBUMIN 3.0 (L) 12/29/2018   ALKPHOS 69 12/29/2018     Electrolytes Lab Results  Component Value Date   NA 137 04/27/2019   K 6.3 (HH) 04/27/2019   CL 111 04/27/2019   CALCIUM 8.9 04/27/2019   MG 1.9 12/31/2018     Bone No results found for: VD25OH, DS287GO1LXB, WI2035DH7, CB6384TX6, 25OHVITD1, 25OHVITD2, 25OHVITD3, TESTOFREE, TESTOSTERONE   Inflammation (CRP: Acute Phase) (ESR: Chronic Phase) Lab Results  Component Value Date   CRP 2.4 (H) 11/20/2018   ESRSEDRATE 72 (H) 11/20/2018   LATICACIDVEN 1.7 09/09/2017       Note: Above Lab results reviewed.   Assessment  The primary encounter diagnosis was Lumbar radiculopathy. Diagnoses of Chronic radicular lumbar pain, Left hip pain, Arthralgia of left hip, Spinal stenosis, lumbar region, with neurogenic claudication, History of lumbar fusion, and Chronic pain syndrome were also pertinent to this visit.  Plan of Care   Mr. RIELEY HAUSMAN has a current medication list which includes the following long-term medication(s): amlodipine, cetirizine, escitalopram, ferrous sulfate, fluticasone, furosemide, glipizide, hydrocodone-acetaminophen, [START ON 01/28/2020] hydrocodone-acetaminophen, isosorbide mononitrate, potassium chloride sa, ramipril, gabapentin, glipizide, and hydrocodone-acetaminophen.   1.  Lumbar radiculopathy, chronic lumbar radicular pain: Status post 2 series of lumbar epidural steroid injection at L2-L3.  Endorsing significant pain relief improvement in functional status in regards to his radicular radiating pain.  Can repeat as needed. 2.  Left hip pain, left hip arthralgia secondary to left hip degeneration and osteoarthritis: Plan for left hip intra-articular steroid injection without sedation.   Orders:  Orders Placed This Encounter  Procedures  . Lumbar Epidural Injection    Standing Status:   Standing    Number of Occurrences:   9    Standing Expiration Date:   01/26/2021    Scheduling Instructions:      Purpose: Palliative     Indication: Lower extremity pain/Sciatica unspecified side (M54.30).     Side: Midline     Level: TBD     Sedation: Patient's choice.     TIMEFRAME: PRN procedure. (Mr. Mesta will call when needed.)    Order Specific Question:   Where will this procedure be performed?    Answer:   [facility]  . HIP INJECTION    Standing Status:   Future    Standing Expiration Date:   04/28/2020    Scheduling Instructions:     Left hip   Follow-up plan:   Return in about 2 weeks (around 02/10/2020) for Left hip steroid.      Interventional management options:  Considering:   S/p L2/3 ESI 08/26/19, 01/06/20: Significant pain relief, repeat as needed   Recent Visits Date Type Provider Dept  01/06/20 Procedure visit Gillis Santa, [facility]  11/14/19 Office Visit Holley Raring,  Carlus Pavlov, MD [facility]  Showing recent visits within past 90 days and meeting all other requirements Today's Visits Date Type Provider Dept  01/27/20 Telemedicine Gillis Santa, MD [facility]  Showing today's visits and meeting all other requirements Future Appointments Date Type Provider Dept  02/20/20 Appointment Gillis Santa, MD [facility]  Showing future appointments within next 90 days and meeting all other requirements  I discussed the assessment and treatment plan with the patient. The patient was provided an opportunity to ask questions and all were answered. The patient agreed with the plan and demonstrated an understanding of the instructions.  Patient advised to call back or seek an in-person evaluation if the symptoms or condition worsens.  Duration of encounter: 53mnutes.  Note by: BGillis Santa MD Date: 01/27/2020; Time: 2:33 PM

## 2020-02-17 ENCOUNTER — Other Ambulatory Visit: Payer: Self-pay

## 2020-02-17 ENCOUNTER — Encounter: Payer: Self-pay | Admitting: Student in an Organized Health Care Education/Training Program

## 2020-02-17 ENCOUNTER — Ambulatory Visit
Admission: RE | Admit: 2020-02-17 | Discharge: 2020-02-17 | Disposition: A | Discharge status: Home / Self Care | Payer: Medicare Other | Source: Ambulatory Visit | Attending: Student in an Organized Health Care Education/Training Program | Admitting: Student in an Organized Health Care Education/Training Program

## 2020-02-17 ENCOUNTER — Ambulatory Visit (HOSPITAL_BASED_OUTPATIENT_CLINIC_OR_DEPARTMENT_OTHER): Payer: Medicare Other | Admitting: Student in an Organized Health Care Education/Training Program

## 2020-02-17 VITALS — BP 149/84 | HR 46 | Temp 97.4°F | Resp 18 | Ht 70.0 in | Wt 232.0 lb

## 2020-02-17 DIAGNOSIS — G894 Chronic pain syndrome: Secondary | ICD-10-CM | POA: Insufficient documentation

## 2020-02-17 DIAGNOSIS — G8929 Other chronic pain: Secondary | ICD-10-CM | POA: Diagnosis present

## 2020-02-17 DIAGNOSIS — M25552 Pain in left hip: Secondary | ICD-10-CM | POA: Insufficient documentation

## 2020-02-17 DIAGNOSIS — M5416 Radiculopathy, lumbar region: Secondary | ICD-10-CM | POA: Insufficient documentation

## 2020-02-17 MED ORDER — METHYLPREDNISOLONE ACETATE 40 MG/ML IJ SUSP
40.0000 mg | Freq: Once | INTRAMUSCULAR | Status: AC
  Administered 2020-02-17: 40 mg via INTRA_ARTICULAR

## 2020-02-17 MED ORDER — METHYLPREDNISOLONE ACETATE 40 MG/ML IJ SUSP
INTRAMUSCULAR | Status: AC
  Filled 2020-02-17: qty 1

## 2020-02-17 MED ORDER — HYDROCODONE-ACETAMINOPHEN 5-325 MG PO TABS: 1.0000 | ORAL_TABLET | Freq: Three times a day (TID) | ORAL | 0 refills | Status: DC | PRN

## 2020-02-17 MED ORDER — ROPIVACAINE HCL 2 MG/ML IJ SOLN
INTRAMUSCULAR | Status: AC
  Filled 2020-02-17: qty 10

## 2020-02-17 MED ORDER — IOHEXOL 180 MG/ML  SOLN
10.0000 mL | Freq: Once | INTRAMUSCULAR | Status: AC
  Administered 2020-02-17: 10 mL via INTRA_ARTICULAR

## 2020-02-17 MED ORDER — ROPIVACAINE HCL 2 MG/ML IJ SOLN
9.0000 mL | Freq: Once | INTRAMUSCULAR | Status: AC
  Administered 2020-02-17: 9 mL via INTRA_ARTICULAR

## 2020-02-17 NOTE — Progress Notes (Signed)
PROVIDER NOTE: Information contained herein reflects review and annotations entered in association with encounter. Interpretation of such information and data should be left to medically-trained personnel. Information provided to patient can be located elsewhere in the medical record under "Patient Instructions". Document created using STT-dictation technology, any transcriptional errors that may result from process are unintentional.    Patient: Michael Cobb  Service Category: Procedure  Provider: Gillis Santa, MD  DOB: [birth year]  DOS: 02/17/2020  Location: [facility]  MRN: [id number]  Setting: Ambulatory - outpatient  Referring Provider: Leeanne Rio, MD  Type: Established Patient  Specialty: Interventional Pain Management  PCP: Leeanne Rio, MD   Primary Reason for Visit: Interventional Pain Management Treatment. CC: Hip Pain (left) and Arm Pain (Left+)  Procedure:          Anesthesia, Analgesia, Anxiolysis:  Type: Intra-Articular Hip Injection #1  Primary Purpose: Diagnostic Region: Anterolateral hip joint area. Level: Lower pelvic and hip joint level. Target Area: Superior aspect of the hip joint cavity, going thru the superior portion of the capsular ligament. Approach: Anterior approach. Laterality: Left-Sided   Local Anesthetic: Lidocaine 1-2%  Position: Supine Prepped Area: Entire Anterolateral hip area.    Indications: 1. Arthralgia of left hip   2. Left hip pain   3. Chronic pain syndrome   4. Chronic radicular lumbar pain   5. Lumbar radiculopathy    Pain Score: Pre-procedure: 8 /10 Post-procedure: 0-No pain/10   Pre-op H&P Assessment:  Mr. Frankl is a 85 y.o. (year old), male patient, seen today for interventional treatment. He  has a past surgical history that includes Appendectomy; Back surgery; Surgery of lip; and LEFT HEART CATH AND CORONARY ANGIOGRAPHY (N/A, 06/27/2017). Mr. Kendrix has a current medication list which includes the  following prescription(s): acetaminophen, amlodipine, aspirin ec, cetirizine, docusate sodium, ergocalciferol, escitalopram, repatha sureclick, ferrous sulfate, fluticasone, furosemide, gabapentin, glipizide, glipizide, isosorbide mononitrate, meclizine, omega-3 fatty acids, potassium chloride sa, ramipril, tamsulosin, cyanocobalamin, [START ON 02/27/2020] hydrocodone-acetaminophen, [START ON 03/28/2020] hydrocodone-acetaminophen, and [START ON 04/27/2020] hydrocodone-acetaminophen. His primarily concern today is the Hip Pain (left) and Arm Pain (Left+)  Initial Vital Signs:  Pulse/HCG Rate: (!) 46ECG Heart Rate: 64 Temp: (!) 97.4 F (36.3 C) Resp: 16 BP: (!) 142/69 SpO2: 93 %  BMI: Estimated body mass index is 33.29 kg/m as calculated from the following:   Height as of this encounter: 5\' 10"  (1.778 m).   Weight as of this encounter: 232 lb (105.2 kg).  Risk Assessment: Allergies: Reviewed. He is allergic to metformin and morphine and related.  Allergy Precautions: None required Coagulopathies: Reviewed. None identified.  Blood-thinner therapy: None at this time Active Infection(s): Reviewed. None identified. Mr. Pelfrey is afebrile  Site Confirmation: Mr. Tallman was asked to confirm the procedure and laterality before marking the site Procedure checklist: Completed Consent: Before the procedure and under the influence of no sedative(s), amnesic(s), or anxiolytics, the patient was informed of the treatment options, risks and possible complications. To fulfill our ethical and legal obligations, as recommended by the American Medical Association's Code of Ethics, I have informed the patient of my clinical impression; the nature and purpose of the treatment or procedure; the risks, benefits, and possible complications of the intervention; the alternatives, including doing nothing; the risk(s) and benefit(s) of the alternative treatment(s) or procedure(s); and the risk(s) and benefit(s) of doing  nothing. The patient was provided information about the general risks and possible complications associated with the procedure. These may include, but are  not limited to: failure to achieve desired goals, infection, bleeding, organ or nerve damage, allergic reactions, paralysis, and death. In addition, the patient was informed of those risks and complications associated to the procedure, such as failure to decrease pain; infection; bleeding; organ or nerve damage with subsequent damage to sensory, motor, and/or autonomic systems, resulting in permanent pain, numbness, and/or weakness of one or several areas of the body; allergic reactions; (i.e.: anaphylactic reaction); and/or death. Furthermore, the patient was informed of those risks and complications associated with the medications. These include, but are not limited to: allergic reactions (i.e.: anaphylactic or anaphylactoid reaction(s)); adrenal axis suppression; blood sugar elevation that in diabetics may result in ketoacidosis or comma; water retention that in patients with history of congestive heart failure may result in shortness of breath, pulmonary edema, and decompensation with resultant heart failure; weight gain; swelling or edema; medication-induced neural toxicity; particulate matter embolism and blood vessel occlusion with resultant organ, and/or nervous system infarction; and/or aseptic necrosis of one or more joints. Finally, the patient was informed that Medicine is not an exact science; therefore, there is also the possibility of unforeseen or unpredictable risks and/or possible complications that may result in a catastrophic outcome. The patient indicated having understood very clearly. We have given the patient no guarantees and we have made no promises. Enough time was given to the patient to ask questions, all of which were answered to the patient's satisfaction. Mr. Neenan has indicated that he wanted to continue with the  procedure. Attestation: I, the ordering provider, attest that I have discussed with the patient the benefits, risks, side-effects, alternatives, likelihood of achieving goals, and potential problems during recovery for the procedure that I have provided informed consent. Date  Time: 02/17/2020  9:44 AM  Pre-Procedure Preparation:  Monitoring: As per clinic protocol. Respiration, ETCO2, SpO2, BP, heart rate and rhythm monitor placed and checked for adequate function Safety Precautions: Patient was assessed for positional comfort and pressure points before starting the procedure. Time-out: I initiated and conducted the "Time-out" before starting the procedure, as per protocol. The patient was asked to participate by confirming the accuracy of the "Time Out" information. Verification of the correct person, site, and procedure were performed and confirmed by me, the nursing staff, and the patient. "Time-out" conducted as per Joint Commission's Universal Protocol (UP.01.01.01). Time: 1035  Description of Procedure:          Safety Precautions: Aspiration looking for blood return was conducted prior to all injections. At no point did we inject any substances, as a needle was being advanced. No attempts were made at seeking any paresthesias. Safe injection practices and needle disposal techniques used. Medications properly checked for expiration dates. SDV (single dose vial) medications used. Description of the Procedure: Protocol guidelines were followed. The patient was placed in position over the fluoroscopy table. The target area was identified and the area prepped in the usual manner. Skin & deeper tissues infiltrated with local anesthetic. Appropriate amount of time allowed to pass for local anesthetics to take effect. The procedure needles were then advanced to the target area. Proper needle placement secured. Negative aspiration confirmed. Solution injected in intermittent fashion, asking for systemic  symptoms every 0.5cc of injectate. The needles were then removed and the area cleansed, making sure to leave some of the prepping solution back to take advantage of its long term bactericidal properties. Vitals:   02/17/20 0959 02/17/20 1035 02/17/20 1040 02/17/20 1044  BP: (!) 142/69 (!) 154/67 (!) 147/84 Marland Kitchen)  149/84  Pulse: (!) 46     Resp: 16 16 16 18   Temp: (!) 97.4 F (36.3 C)     TempSrc: Temporal     SpO2: 93% 99% 100% 98%  Weight: 232 lb (105.2 kg)     Height: 5\' 10"  (1.778 m)       Start Time: 1035 hrs. End Time: 1042 hrs. Materials:  Needle(s) Type: Spinal Needle Gauge: 22G Length: 3.5-in Medication(s): Please see orders for medications and dosing details. 5 cc solution made of 4 cc of 0.2% Vivacaine, 1 cc of methylprednisolone, 40 mg/cc. Imaging Guidance (Non-Spinal):          Type of Imaging Technique: Fluoroscopy Guidance (Non-Spinal) and ultrasound guidance to avoid femoral vessels.  Color Doppler used. Indication(s): Assistance in needle guidance and placement for procedures requiring needle placement in or near specific anatomical locations not easily accessible without such assistance. Exposure Time: Please see nurses notes. Contrast: Before injecting any contrast, we confirmed that the patient did not have an allergy to iodine, shellfish, or radiological contrast. Once satisfactory needle placement was completed at the desired level, radiological contrast was injected. Contrast injected under live fluoroscopy. No contrast complications. See chart for type and volume of contrast used. Fluoroscopic Guidance: I was personally present during the use of fluoroscopy. "Tunnel Vision Technique" used to obtain the best possible view of the target area. Parallax error corrected before commencing the procedure. "Direction-depth-direction" technique used to introduce the needle under continuous pulsed fluoroscopy. Once target was reached, antero-posterior, oblique, and lateral  fluoroscopic projection used confirm needle placement in all planes. Images permanently stored in EMR. Interpretation: I personally interpreted the imaging intraoperatively. Adequate needle placement confirmed in multiple planes. Appropriate spread of contrast into desired area was observed. No evidence of afferent or efferent intravascular uptake. Permanent images saved into the patient's record.  Post-operative Assessment:  Post-procedure Vital Signs:  Pulse/HCG Rate: (!) 4661 Temp: (!) 97.4 F (36.3 C) Resp: 18 BP: (!) 149/84 SpO2: 98 %  EBL: None  Complications: No immediate post-treatment complications observed by team, or reported by patient.  Note: The patient tolerated the entire procedure well. A repeat set of vitals were taken after the procedure and the patient was kept under observation following institutional policy, for this type of procedure. Post-procedural neurological assessment was performed, showing return to baseline, prior to discharge. The patient was provided with post-procedure discharge instructions, including a section on how to identify potential problems. Should any problems arise concerning this procedure, the patient was given instructions to immediately contact us, at any time, without hesitation. In any case, we plan to contact the patient by telephone for a follow-up status report regarding this interventional procedure.  Comments:  No additional relevant information.   Patient also presents for medication refill.  He states that he is having to utilize a third hydrocodone on some evenings as it only provides 4 to 5 hours of pain relief.  We discussed increasing to 3 times daily as needed, quantity 90/month.  Patient given instructions on bowel regimen and encouraged him to use stool softeners/MiraLAX he gets constipated secondary to opioid therapy.  Pharmacotherapy Assessment   Analgesic: Increase to hydrocodone 5 mg 3 times daily as needed, quantity  90/month; MME equals 15    Monitoring: [place] PMP: PDMP reviewed during this encounter.       Pharmacotherapy: No side-effects or adverse reactions reported. Compliance: No problems identified. Effectiveness: Clinically acceptable.  UDS:  Summary  Date Value Ref Range Status  04/24/2019 Note  Final    Comment:    ==================================================================== Compliance Drug Analysis, Ur ==================================================================== Test                             Result       Flag       Units Drug Present and Declared for Prescription Verification   Acetaminophen                  PRESENT      EXPECTED   Guaifenesin                    PRESENT      EXPECTED    Guaifenesin may be administered as an over-the-counter or    prescription drug; it may also be present as a breakdown product of    methocarbamol. Drug Absent but Declared for Prescription Verification   Gabapentin                     Not Detected UNEXPECTED   Salicylate                     Not Detected UNEXPECTED    Aspirin, as indicated in the declared medication list, is not always    detected even when used as directed.   Dextromethorphan               Not Detected UNEXPECTED ==================================================================== Test                      Result    Flag   Units      Ref Range   Creatinine              75               mg/dL      >=20 ==================================================================== Declared Medications:  The flagging and interpretation on this report are based on the  following declared medications.  Unexpected results may arise from  inaccuracies in the declared medications.  **Note: The testing scope of this panel includes these medications:  Dextromethorphan (Mucinex DM)  Gabapentin  Guaifenesin (Mucinex DM)  **Note: The testing scope of this panel does not include small to  moderate amounts of these reported medications:   Acetaminophen  Aspirin  **Note: The testing scope of this panel does not include the  following reported medications:  Allopurinol  Amlodipine  Cetirizine  Cyanocobalamin  Docusate  Evolocumab  Fluticasone  Furosemide  Glipizide  Iron  Isosorbide (Imdur)  Meclizine  Omega-3 Fatty Acids  Potassium (Klor-Con)  Ramipril  Tamsulosin ==================================================================== For clinical consultation, please call [phone]. ====================================================================       Plan of Care  Orders:  Orders Placed This Encounter  Procedures  . [facility] C-ARM 1-60 MIN NO REPORT    Intraoperative interpretation by procedural physician at [facility].    Standing Status:   Standing    Number of Occurrences:   1    Order Specific Question:   Reason for exam:    Answer:   Assistance in needle guidance and placement for procedures requiring needle placement in or near specific anatomical locations not easily accessible without such assistance.   Chronic Opioid Analgesic:  Hydrocodone 5 mg 3 times daily as needed, quantity 90/month MME equals 15   Medications ordered for procedure: Meds ordered this encounter  Medications  .  iohexol (OMNIPAQUE) 180 MG/ML injection 10 mL    Must be Myelogram-compatible. If not available, you may substitute with a water-soluble, non-ionic, hypoallergenic, myelogram-compatible radiological contrast medium.  . ropivacaine (PF) 2 mg/mL (0.2%) (NAROPIN) injection 9 mL  . methylPREDNISolone acetate (DEPO-MEDROL) injection 40 mg  . HYDROcodone-acetaminophen (NORCO/VICODIN) 5-325 MG tablet    Sig: Take 1 tablet by mouth every 8 (eight) hours as needed for severe pain. Must last 30 days.    Dispense:  90 tablet    Refill:  0    Chronic Pain. (STOP Act - Not applicable). Fill one day early if closed on scheduled refill date.  Marland Kitchen HYDROcodone-acetaminophen (NORCO/VICODIN) 5-325 MG tablet     Sig: Take 1 tablet by mouth every 8 (eight) hours as needed for severe pain. Must last 30 days.    Dispense:  90 tablet    Refill:  0    Chronic Pain. (STOP Act - Not applicable). Fill one day early if closed on scheduled refill date.  Marland Kitchen HYDROcodone-acetaminophen (NORCO/VICODIN) 5-325 MG tablet    Sig: Take 1 tablet by mouth every 8 (eight) hours as needed for severe pain. Must last 30 days.    Dispense:  90 tablet    Refill:  0    Chronic Pain. (STOP Act - Not applicable). Fill one day early if closed on scheduled refill date.   Medications administered: We administered iohexol, ropivacaine (PF) 2 mg/mL (0.2%), and methylPREDNISolone acetate.  See the medical record for exact dosing, route, and time of administration.  Follow-up plan:   Return in about 4 weeks (around 03/16/2020) for Post Procedure Evaluation, virtual.       Interventional management options:  Considering:   S/p L2/3 ESI 08/26/19, 01/06/20: Significant pain relief, repeat as needed Status post left intra-articular hip steroid injection 02/17/2020   Recent Visits Date Type Provider Dept  01/27/20 Telemedicine Gillis Santa, MD [facility]  01/06/20 Procedure visit Gillis Santa, MD [facility]  Showing recent visits within past 90 days and meeting all other requirements Today's Visits Date Type Provider Dept  02/17/20 Procedure visit Gillis Santa, MD [facility]  Showing today's visits and meeting all other requirements Future Appointments Date Type Provider Dept  03/17/20 Appointment Gillis Santa, MD [facility]  Showing future appointments within next 90 days and meeting all other requirements  Disposition: Discharge home  Discharge (Date  Time): 02/17/2020; 1055 hrs.   Primary Care Physician: Leeanne Rio, MD Location: [facility] Note by: Gillis Santa, MD Date: 02/17/2020; Time: 11:01 AM  Disclaimer:  Medicine is not an  exact science. The only guarantee in medicine is that nothing is guaranteed. It is important to note that the decision to proceed with this intervention was based on the information collected from the patient. The Data and conclusions were drawn from the patient's questionnaire, the interview, and the physical examination. Because the information was provided in large part by the patient, it cannot be guaranteed that it has not been purposely or unconsciously manipulated. Every effort has been made to obtain as much relevant data as possible for this evaluation. It is important to note that the conclusions that lead to this procedure are derived in large part from the available data. Always take into account that the treatment will also be dependent on availability of resources and existing treatment guidelines, considered by other Pain Management Practitioners as being common knowledge and practice, at the time of the intervention. For Medico-Legal  purposes, it is also important to point out that variation in procedural techniques and pharmacological choices are the acceptable norm. The indications, contraindications, technique, and results of the above procedure should only be interpreted and judged by a Board-Certified Interventional Pain Specialist with extensive familiarity and expertise in the same exact procedure and technique.

## 2020-02-17 NOTE — Patient Instructions (Signed)

## 2020-02-17 NOTE — Progress Notes (Signed)
Safety precautions to be maintained throughout the outpatient stay will include: orient to surroundings, keep bed in low position, maintain call bell within reach at all times, provide assistance with transfer out of bed and ambulation.  

## 2020-02-18 ENCOUNTER — Telehealth: Payer: Self-pay | Admitting: *Deleted

## 2020-02-18 NOTE — Telephone Encounter (Signed)
Attempted to call for post procedure follow-up. Message left. 

## 2020-02-20 ENCOUNTER — Encounter: Payer: Medicare Other | Admitting: Student in an Organized Health Care Education/Training Program

## 2020-02-26 ENCOUNTER — Encounter: Payer: Self-pay | Admitting: *Deleted

## 2020-02-26 NOTE — Progress Notes (Signed)
Prior auth for repatha 140mg /ml auto injection done through covermymeds and approved starting 02/26/2020 to 02/25/2021

## 2020-03-02 ENCOUNTER — Other Ambulatory Visit: Payer: Self-pay

## 2020-03-02 ENCOUNTER — Other Ambulatory Visit: Payer: Self-pay | Admitting: Cardiology

## 2020-03-02 MED ORDER — AMLODIPINE BESYLATE 10 MG PO TABS: 10.0000 mg | ORAL_TABLET | Freq: Every day | ORAL | 3 refills | Status: DC

## 2020-03-16 ENCOUNTER — Other Ambulatory Visit: Payer: Self-pay | Admitting: *Deleted

## 2020-03-16 ENCOUNTER — Encounter: Payer: Self-pay | Admitting: Student in an Organized Health Care Education/Training Program

## 2020-03-16 ENCOUNTER — Encounter: Payer: Self-pay | Admitting: Cardiology

## 2020-03-16 ENCOUNTER — Ambulatory Visit: Payer: Medicare Other | Admitting: Cardiology

## 2020-03-16 VITALS — BP 122/62 | HR 66 | Ht 70.0 in | Wt 243.0 lb

## 2020-03-16 DIAGNOSIS — E782 Mixed hyperlipidemia: Secondary | ICD-10-CM

## 2020-03-16 DIAGNOSIS — I25119 Atherosclerotic heart disease of native coronary artery with unspecified angina pectoris: Secondary | ICD-10-CM

## 2020-03-16 DIAGNOSIS — I25118 Atherosclerotic heart disease of native coronary artery with other forms of angina pectoris: Secondary | ICD-10-CM | POA: Diagnosis not present

## 2020-03-16 DIAGNOSIS — I255 Ischemic cardiomyopathy: Secondary | ICD-10-CM | POA: Diagnosis not present

## 2020-03-16 DIAGNOSIS — I10 Essential (primary) hypertension: Secondary | ICD-10-CM

## 2020-03-16 MED ORDER — REPATHA SURECLICK 140 MG/ML ~~LOC~~ SOAJ: 140.0000 mg | SUBCUTANEOUS | 2 refills | Status: DC

## 2020-03-16 NOTE — Progress Notes (Signed)
Cardiology Office Note  Date: 03/16/2020   ID: Bernardo, Jodoin Mar 13, 1934, MRN [id number]  PCP:  Leonie Douglas, MD  Cardiologist:  Rozann Lesches, MD Electrophysiologist:  None   Chief Complaint  Patient presents with  . Cardiac follow-up    History of Present Illness: Michael Cobb is an 86 y.o. male former patient Dr. Bronson Ing now presenting to establish follow-up with me.  I reviewed his records and updated the chart.  He was last seen in July 2021 by Mr. Leonides Sake NP.  He reports no active angina symptoms at this time, NYHA class II dyspnea with typical activities.  He does have functional limitations related to chronic pain, uses a cane to ambulate, denies any recent falls.  He does not report any orthopnea or PND.  Cardiac catheterization in April 2019 revealed severe multivessel CAD, heavily calcified vessels and poor targets for revascularization.  Medical therapy was recommended by Dr. Angelena Form.  I reviewed his medications which are outlined below.  ECG today shows sinus rhythm with old inferolateral infarct pattern.  Last echocardiogram was in October 2020 at which point LVEF was 40 to 45%.  I told with him about getting an updated study.  I recommended that he stay on Repatha, he was also recently started on Jardiance by PCP.  His LDL came down from 227-82.  He has prior statin intolerance.  He is retired, previously worked in the [employer].  Past Medical History:  Diagnosis Date  . BPH (benign prostatic hyperplasia)   . CAD (coronary artery disease)    Multivessel CAD, heavily calcified  . Chronic renal insufficiency, stage 3 (moderate) (HCC)   . Essential hypertension   . History of skin cancer   . Ischemic cardiomyopathy   . Mixed hyperlipidemia   . Non-ST elevated myocardial infarction (non-STEMI) ([place])   . Statin intolerance   . Type 2 diabetes mellitus ([facility])     Past Surgical History:  Procedure Laterality Date  . APPENDECTOMY    .  BACK SURGERY    . LEFT HEART CATH AND CORONARY ANGIOGRAPHY N/A 06/27/2017   Procedure: LEFT HEART CATH AND CORONARY ANGIOGRAPHY;  Surgeon: Burnell Blanks, MD;  Location: [facility];  Service: Cardiovascular;  Laterality: N/A;  . SURGERY OF LIP      Current Outpatient Medications  Medication Sig Dispense Refill  . acetaminophen (TYLENOL) 500 MG tablet Take 500 mg by mouth every 6 (six) hours as needed for mild pain or moderate pain.    Marland Kitchen amLODipine (NORVASC) 10 MG tablet Take 1 tablet (10 mg total) by mouth daily. 90 tablet 3  . aspirin EC 81 MG tablet Take 81 mg by mouth at bedtime.     . cetirizine (ZYRTEC) 10 MG tablet Take 10 mg by mouth at bedtime.     . docusate sodium (COLACE) 100 MG capsule Take 100 mg by mouth 2 (two) times daily.    . ergocalciferol (VITAMIN D2) 1.25 MG (50000 UT) capsule Take by mouth.    . escitalopram (LEXAPRO) 10 MG tablet Take by mouth.    . Evolocumab (REPATHA SURECLICK) [id number] MG/ML SOAJ Inject 140 mg into the skin every 14 (fourteen) days. 6 pen 3  . ferrous sulfate 325 (65 FE) MG tablet Take 1 tablet (325 mg total) by mouth 2 (two) times daily with a meal. 30 tablet 3  . fluticasone (FLONASE) 50 MCG/ACT nasal spray Place 1 spray into both nostrils daily as needed for allergies or  rhinitis.     . furosemide (LASIX) 40 MG tablet Take 1 tablet (40 mg total) by mouth daily. 30 tablet 2  . gabapentin (NEURONTIN) 100 MG capsule Take 1-2 capsules (100-200 mg total) by mouth at bedtime. 60 capsule 2  . glipiZIDE (GLUCOTROL) 10 MG tablet Take 1 tablet by mouth 2 (two) times daily.    Derrill Memo ON 04/27/2020] HYDROcodone-acetaminophen (NORCO/VICODIN) 5-325 MG tablet Take 1 tablet by mouth every 8 (eight) hours as needed for severe pain. Must last 30 days. 90 tablet 0  . isosorbide mononitrate (IMDUR) 30 MG 24 hr tablet TAKE ONE TABLET BY MOUTH DAILY 90 tablet 2  . meclizine (ANTIVERT) 25 MG tablet Take 25 mg by mouth 3 (three) times daily as needed for  dizziness.    . Omega-3 Fatty Acids (FISH OIL PO) Take 1 tablet by mouth 2 (two) times daily.     . potassium chloride SA (KLOR-CON) 20 MEQ tablet TAKE ONE TABLET BY MOUTH DAILY. 90 tablet 3  . ramipril (ALTACE) 10 MG capsule Take 10 mg by mouth 2 (two) times daily.  0  . tamsulosin (FLOMAX) 0.4 MG CAPS capsule Take 0.4 mg by mouth at bedtime.     . vitamin B-12 1000 MCG tablet Take 1 tablet (1,000 mcg total) by mouth daily. 30 tablet 1   No current facility-administered medications for this visit.   Allergies:  Metformin and Morphine and related   ROS: No syncope.  Physical Exam: VS:  BP 122/62   Pulse 66   Ht 5\' 10"  (1.778 m)   Wt 243 lb (110.2 kg)   SpO2 98%   BMI 34.87 kg/m , BMI Body mass index is 34.87 kg/m.  Wt Readings from Last 3 Encounters:  03/16/20 243 lb (110.2 kg)  02/17/20 232 lb (105.2 kg)  01/06/20 232 lb (105.2 kg)    General: Elderly male, appears comfortable at rest.  Using a cane. HEENT: Conjunctiva and lids normal, wearing a mask. Neck: Supple, no elevated JVP or carotid bruits, no thyromegaly. Lungs: Clear to auscultation, nonlabored breathing at rest. Cardiac: Regular rate and rhythm, no S3, soft systolic murmur. Abdomen: Soft, nontender, bowel sounds present. Extremities: No pitting edema. Skin: Warm and dry. Musculoskeletal: No kyphosis. Neuropsychiatric: Alert and oriented x3, affect grossly appropriate.  Hearing loss evident.  ECG:  An ECG dated 12/28/2018 was personally reviewed today and demonstrated:  Sinus rhythm with nonspecific ST-T changes, old inferolateral infarct pattern, PVC.  Recent Labwork: 04/27/2019: BUN 31; Creatinine, Ser 1.66; Hemoglobin 11.0; Platelets 201; Potassium 6.3; Sodium 137     Component Value Date/Time   CHOL 305 (H) 03/25/2019 0904   TRIG 205 (H) 03/25/2019 0904   HDL 37 (L) 03/25/2019 0904   CHOLHDL 8.2 03/25/2019 0904   VLDL 41 (H) 03/25/2019 0904   LDLCALC 227 (H) 03/25/2019 0904  June 2021: Hemoglobin  11.5, platelets 213, cholesterol 152, triglycerides 193, HDL 37, LDL 82, hemoglobin A1c 9%  Other Studies Reviewed Today:  Cardiac catheterization 06/27/2017:  Mid RCA lesion is 60% stenosed.  Prox RCA to Mid RCA lesion is 20% stenosed.  Dist RCA lesion is 30% stenosed.  Post Atrio lesion is 30% stenosed.  Mid LM to Ost LAD lesion is 50% stenosed.  Ost LAD to Prox LAD lesion is 20% stenosed.  Ost 1st Diag lesion is 99% stenosed.  Prox LAD lesion is 90% stenosed.  Prox LAD to Mid LAD lesion is 50% stenosed.  Mid LAD lesion is 80% stenosed.  Mid LAD  to Dist LAD lesion is 90% stenosed.  Ost 1st Mrg to 1st Mrg lesion is 60% stenosed.  Prox Cx to Mid Cx lesion is 50% stenosed.  Ost 2nd Mrg to 2nd Mrg lesion is 99% stenosed.  Mid Cx to Dist Cx lesion is 99% stenosed.   1. Severe triple vessel CAD.  2. Heavily calcified LAD with severe stenosis extending from the proximal vessel into the distal vessel. Severe stenosis ostial small Diagonal branch.  3. Severe stenosis in the distal Circumflex at bifurcation with a small caliber obtuse marginal branch. The entire Circumflex is heavily calcified. The first OM branch has moderate disease.  4. Moderate stenosis mid RCA.  Echocardiogram 12/29/2018: 1. Left ventricular ejection fraction, by visual estimation, is 40 to  45%. The left ventricle has moderately decreased function. There is mildly  increased left ventricular hypertrophy.  2. Inferior and lateral wall severe hypokinesis.  3. Left ventricular diastolic Doppler parameters are consistent with  impaired relaxation pattern of LV diastolic filling.  4. Elevated left atrial and left ventricular end-diastolic pressures.  5. Global right ventricle has normal systolic function.The right  ventricular size is normal. No increase in right ventricular wall  thickness.  6. The aortic valve is tricuspid Aortic valve regurgitation was not  visualized by color flow Doppler. Mild  aortic valve sclerosis without  stenosis.  7. Left atrial size was normal.  8. Right atrial size was normal.  9. The mitral valve is abnormal. Mild mitral valve regurgitation.  10. The tricuspid valve is grossly normal. Tricuspid valve regurgitation  is trivial.  11. The pulmonic valve was grossly normal. Pulmonic valve regurgitation is  not visualized by color flow Doppler.  12. The inferior vena cava is normal in size with <50% respiratory  variability, suggesting right atrial pressure of 8 mmHg.   Assessment and Plan:  1. Multivessel CAD, heavily calcified, and with limited revascularization options based on cardiac catheterization in 2019.  He has continued on medical therapy at this point reports no progressive angina.  Currently on aspirin, Imdur, Altace, and Repatha.  No beta-blocker due to symptomatic bradycardia.  2. Ischemic cardiomyopathy, LVEF 40 to 45% as of October 2020.  Repeat echocardiogram will be obtained.  He reports no progressive swelling, no orthopnea or PND.  He is on Lasix with potassium supplement as needed.  No beta-blocker with symptomatic bradycardia, otherwise on Altace.  3.  Mixed hyperlipidemia with statin intolerance.  He has done well with Repatha, LDL down from 227-82.  I have recommended that he continue.  4. Essential hypertension, blood pressure is well controlled today.  5. CKD stage IIIb, last creatinine 1.66.  Medication Adjustments/Labs and Tests Ordered: Current medicines are reviewed at length with the patient today.  Concerns regarding medicines are outlined above.   Tests Ordered: Orders Placed This Encounter  Procedures  . EKG 12-Lead  . ECHOCARDIOGRAM COMPLETE    Medication Changes: No orders of the defined types were placed in this encounter.   Disposition:  Follow up 6 months in the [place] office.  Signed, Satira Sark, MD, [facility] 03/16/2020 9:50 AM    [facility] [place],  [place], [place] [address] Phone: [phone]; Fax: [phone]

## 2020-03-16 NOTE — Patient Instructions (Addendum)

## 2020-03-17 ENCOUNTER — Other Ambulatory Visit: Payer: Self-pay

## 2020-03-17 ENCOUNTER — Ambulatory Visit
Payer: Medicare Other | Attending: Student in an Organized Health Care Education/Training Program | Admitting: Student in an Organized Health Care Education/Training Program

## 2020-03-17 ENCOUNTER — Encounter: Payer: Self-pay | Admitting: Student in an Organized Health Care Education/Training Program

## 2020-03-17 DIAGNOSIS — M25552 Pain in left hip: Secondary | ICD-10-CM

## 2020-03-17 DIAGNOSIS — G894 Chronic pain syndrome: Secondary | ICD-10-CM | POA: Diagnosis not present

## 2020-03-17 NOTE — Progress Notes (Signed)
Patient: Michael Cobb  Service Category: E/M  Provider: Gillis Santa, MD  DOB: [birth year]  DOS: 03/17/2020  Location: Office  MRN: [id number]  Setting: Ambulatory outpatient  Referring Provider: Leeanne Rio, MD  Type: Established Patient  Specialty: Interventional Pain Management  PCP: Leonie Douglas, MD  Location: Home  Delivery: TeleHealth     Virtual Encounter - Pain Management PROVIDER NOTE: Information contained herein reflects review and annotations entered in association with encounter. Interpretation of such information and data should be left to medically-trained personnel. Information provided to patient can be located elsewhere in the medical record under "Patient Instructions". Document created using STT-dictation technology, any transcriptional errors that may result from process are unintentional.    Contact & Pharmacy Preferred: [phone] Home: [phone] (home) Mobile: There is no such number on file (mobile). E-mail: No e-mail address on record  [place], [place] - [place] [address] [place] [address] Phone: [phone] Fax: [phone]   Pre-screening  Mr. Hacker offered "in-person" vs "virtual" encounter. He indicated preferring virtual for this encounter.   Reason COVID-19*  Social distancing based on CDC and AMA recommendations.   I contacted MANJINDER BREAU on 03/17/2020 via telephone.      I clearly identified myself as Gillis Santa, MD. I verified that I was speaking with the correct person using two identifiers (Name: FABYAN LOUGHMILLER, and date of birth: [birth year]).  Consent I sought verbal advanced consent from [name] for virtual visit interactions. I informed Mr. Camplin of possible security and privacy concerns, risks, and limitations associated with providing "not-in-person" medical evaluation and management services. I also informed Mr. Gruenwald of the availability of "in-person" appointments. Finally, I  informed him that there would be a charge for the virtual visit and that he could be  personally, fully or partially, financially responsible for it. Mr. Taite expressed understanding and agreed to proceed.   Historic Elements   Mr. WILLIAN DONSON is a 86 y.o. year old, male patient evaluated today after our last contact on 02/17/2020. Mr. Wingert  has a past medical history of BPH (benign prostatic hyperplasia), CAD (coronary artery disease), Chronic renal insufficiency, stage 3 (moderate) ([place]), Essential hypertension, History of skin cancer, Ischemic cardiomyopathy, Mixed hyperlipidemia, Non-ST elevated myocardial infarction (non-STEMI) ([place]), Statin intolerance, and Type 2 diabetes mellitus ([place]). He also  has a past surgical history that includes Appendectomy; Back surgery; Surgery of lip; and LEFT HEART CATH AND CORONARY ANGIOGRAPHY (N/A, 06/27/2017). Mr. Rodier has a current medication list which includes the following prescription(s): acetaminophen, amlodipine, aspirin ec, cetirizine, docusate sodium, empagliflozin, ergocalciferol, escitalopram, repatha sureclick, ferrous sulfate, fluticasone, furosemide, glipizide, [START ON 04/27/2020] hydrocodone-acetaminophen, isosorbide mononitrate, meclizine, omega-3 fatty acids, potassium chloride sa, ramipril, tamsulosin, cyanocobalamin, and gabapentin. He  reports that he has never smoked. He has never used smokeless tobacco. He reports that he does not drink alcohol and does not use drugs. Mr. Hanak is allergic to metformin and morphine and related.   HPI  Today, he is being contacted for a post-procedure assessment.    Post-Procedure Evaluation  Procedure (02/17/2020):  Type: Intra-Articular Hip Injection #1  Primary Purpose: Diagnostic Region: Anterolateral hip joint area. Level: Lower pelvic and hip joint level. Target Area: Superior aspect of the hip joint cavity, going thru the superior portion of the capsular ligament. Approach: Anterior  approach. Laterality: Left-Sided  Sedation: Please see nurses note.  Effectiveness during initial hour after procedure(Ultra-Short Term Relief): 100 %   Local  anesthetic used: Long-acting (4-6 hours) Effectiveness: Defined as any analgesic benefit obtained secondary to the administration of local anesthetics. This carries significant diagnostic value as to the etiological location, or anatomical origin, of the pain. Duration of benefit is expected to coincide with the duration of the local anesthetic used.  Effectiveness during initial 4-6 hours after procedure(Short-Term Relief): 100 %   Long-term benefit: Defined as any relief past the pharmacologic duration of the local anesthetics.  Effectiveness past the initial 6 hours after procedure(Long-Term Relief): 100 % (ongoing with meds)  Current benefits: Defined as benefit that persist at this time.   Analgesia:  90-100% better Function: Somewhat improved ROM: Somewhat improved   Pharmacotherapy Assessment  Analgesic:  hydrocodone 5 mg 3 times daily as needed, quantity 90/month; MME equals 15    Monitoring: [place] PMP: PDMP reviewed during this encounter.       Pharmacotherapy: No side-effects or adverse reactions reported. Compliance: No problems identified. Effectiveness: Clinically acceptable. Plan: Refer to "POC".  UDS:  Summary  Date Value Ref Range Status  04/24/2019 Note  Final    Comment:    ==================================================================== Compliance Drug Analysis, Ur ==================================================================== Test                             Result       Flag       Units Drug Present and Declared for Prescription Verification   Acetaminophen                  PRESENT      EXPECTED   Guaifenesin                    PRESENT      EXPECTED    Guaifenesin may be administered as an over-the-counter or    prescription drug; it may also be present as a breakdown product of     methocarbamol. Drug Absent but Declared for Prescription Verification   Gabapentin                     Not Detected UNEXPECTED   Salicylate                     Not Detected UNEXPECTED    Aspirin, as indicated in the declared medication list, is not always    detected even when used as directed.   Dextromethorphan               Not Detected UNEXPECTED ==================================================================== Test                      Result    Flag   Units      Ref Range   Creatinine              75               mg/dL      >=20 ==================================================================== Declared Medications:  The flagging and interpretation on this report are based on the  following declared medications.  Unexpected results may arise from  inaccuracies in the declared medications.  **Note: The testing scope of this panel includes these medications:  Dextromethorphan (Mucinex DM)  Gabapentin  Guaifenesin (Mucinex DM)  **Note: The testing scope of this panel does not include small to  moderate amounts of these reported medications:  Acetaminophen  Aspirin  **Note: The testing scope of this panel does not  include the  following reported medications:  Allopurinol  Amlodipine  Cetirizine  Cyanocobalamin  Docusate  Evolocumab  Fluticasone  Furosemide  Glipizide  Iron  Isosorbide (Imdur)  Meclizine  Omega-3 Fatty Acids  Potassium (Klor-Con)  Ramipril  Tamsulosin ==================================================================== For clinical consultation, please call [phone]. ====================================================================     Laboratory Chemistry Profile   Renal Lab Results  Component Value Date   BUN 31 (H) 04/27/2019   CREATININE 1.66 (H) 04/27/2019   GFRAA 44 (L) 04/27/2019   GFRNONAA 38 (L) 04/27/2019     Hepatic Lab Results  Component Value Date   AST 20 12/29/2018   ALT 18 12/29/2018   ALBUMIN 3.0 (L)  12/29/2018   ALKPHOS 69 12/29/2018     Electrolytes Lab Results  Component Value Date   NA 137 04/27/2019   K 6.3 (HH) 04/27/2019   CL 111 04/27/2019   CALCIUM 8.9 04/27/2019   MG 1.9 12/31/2018     Bone No results found for: VD25OH, KD326ZT2WPY, KD9833AS5, KN3976BH4, 25OHVITD1, 25OHVITD2, 25OHVITD3, TESTOFREE, TESTOSTERONE   Inflammation (CRP: Acute Phase) (ESR: Chronic Phase) Lab Results  Component Value Date   CRP 2.4 (H) 11/20/2018   ESRSEDRATE 72 (H) 11/20/2018   LATICACIDVEN 1.7 09/09/2017       Note: Above Lab results reviewed.   Assessment  The primary encounter diagnosis was Arthralgia of left hip. Diagnoses of Left hip pain and Chronic pain syndrome were also pertinent to this visit.  Plan of Care   Mr. ABDULMALIK DARCO has a current medication list which includes the following long-term medication(s): amlodipine, cetirizine, escitalopram, ferrous sulfate, fluticasone, furosemide, glipizide, [START ON 04/27/2020] hydrocodone-acetaminophen, isosorbide mononitrate, potassium chloride sa, ramipril, and gabapentin.  Significant pain relief and improvement in function after left intra-articular hip steroid injection performed on 02/17/2020.  Patient finds it easier to perform ADLs.   He als finds benefit with hydrocodone 5 mgo 3 times daily as needed.  He has an appointment with me in March for medication management.  As needed order placed as below should the patient have return of his left hip pain and would like to repeat left hip steroid injection. Orders:  Orders Placed This Encounter  Procedures  . HIP INJECTION    For hip pain.    Standing Status:   Standing    Number of Occurrences:   1    Standing Expiration Date:   03/17/2021    Scheduling Instructions:     Side: Bilateral     Sedation: Patient's choice.     TIMEFRAME: PRN procedure. (Mr. Duley will call when needed.)   Follow-up plan:   Return for Keep sch. appt.      Interventional management  options:  Considering:   S/p L2/3 ESI 08/26/19, 01/06/20: Significant pain relief, repeat as needed Status post left intra-articular hip steroid injection 02/17/2020   Recent Visits Date Type Provider Dept  02/17/20 Procedure visit Gillis Santa, MD [facility]  01/27/20 Telemedicine Gillis Santa, MD [facility]  01/06/20 Procedure visit Gillis Santa, MD [facility]  Showing recent visits within past 90 days and meeting all other requirements Today's Visits Date Type Provider Dept  03/17/20 Telemedicine Gillis Santa, MD [facility]  Showing today's visits and meeting all other requirements Future Appointments Date Type Provider Dept  05/19/20 Appointment Gillis Santa, MD [facility]  Showing future appointments within next 90 days and meeting all other requirements  I discussed the assessment and treatment plan with the patient.  The patient was provided an opportunity to ask questions and all were answered. The patient agreed with the plan and demonstrated an understanding of the instructions.  Patient advised to call back or seek an in-person evaluation if the symptoms or condition worsens.  Duration of encounter: 20 minutes.  Note by: Gillis Santa, MD Date: 03/17/2020; Time: 2:48 PM

## 2020-04-09 ENCOUNTER — Ambulatory Visit (INDEPENDENT_AMBULATORY_CARE_PROVIDER_SITE_OTHER): Payer: Medicare Other

## 2020-04-09 DIAGNOSIS — I255 Ischemic cardiomyopathy: Secondary | ICD-10-CM | POA: Diagnosis not present

## 2020-04-09 LAB — ECHOCARDIOGRAM COMPLETE
AR max vel: 1.72 cm2
AV Area VTI: 1.66 cm2
AV Area mean vel: 1.66 cm2
AV Mean grad: 5.3 mmHg
AV Peak grad: 10.4 mmHg
Ao pk vel: 1.62 m/s
Area-P 1/2: 2.16 cm2
Calc EF: 42.8 %
MV M vel: 2.38 m/s
MV Peak grad: 22.7 mmHg
S' Lateral: 4.17 cm
Single Plane A2C EF: 43.8 %
Single Plane A4C EF: 40.8 %

## 2020-04-10 ENCOUNTER — Telehealth: Payer: Self-pay | Admitting: *Deleted

## 2020-04-10 NOTE — Telephone Encounter (Signed)
-----   Message from Satira Sark, MD sent at 04/09/2020  5:34 PM EST ----- Results reviewed.  LVEF stable in the range of 40 to 45%.  Would continue with current medications and follow-up plan.

## 2020-04-10 NOTE — Telephone Encounter (Signed)
Pt voiced understanding

## 2020-04-17 ENCOUNTER — Telehealth: Payer: Self-pay | Admitting: Cardiology

## 2020-04-17 NOTE — Telephone Encounter (Signed)
Pt c/o medication issue:  1. Name of Medication: potassium chloride SA (KLOR-CON) 20 MEQ tablet  2. How are you currently taking this medication (dosage and times per day)? 2 tablets  3. Are you having a reaction (difficulty breathing--STAT)? N/A  4. What is your medication issue? Michael Cobb shows prescription is 1 tablet a day but the patient told her Dr. Rockey Situ him to take 2 tablets. She needs to clarify because the patient is wanting this medication refilled.

## 2020-04-20 MED ORDER — POTASSIUM CHLORIDE CRYS ER 20 MEQ PO TBCR: 20.0000 meq | EXTENDED_RELEASE_TABLET | Freq: Every day | ORAL | 1 refills | Status: DC

## 2020-04-20 NOTE — Telephone Encounter (Signed)
Potassium 20 meq is documented daily, nothing documented in recent notes that pt was to be taking 2 tablets. Medication sent to pharmacy

## 2020-05-19 ENCOUNTER — Encounter: Payer: Self-pay | Admitting: Student in an Organized Health Care Education/Training Program

## 2020-05-19 ENCOUNTER — Ambulatory Visit
Payer: Medicare Other | Attending: Student in an Organized Health Care Education/Training Program | Admitting: Student in an Organized Health Care Education/Training Program

## 2020-05-19 ENCOUNTER — Other Ambulatory Visit: Payer: Self-pay

## 2020-05-19 VITALS — BP 146/72 | HR 62 | Temp 97.3°F | Resp 16 | Ht 70.0 in | Wt 243.0 lb

## 2020-05-19 DIAGNOSIS — Z981 Arthrodesis status: Secondary | ICD-10-CM | POA: Diagnosis present

## 2020-05-19 DIAGNOSIS — M5416 Radiculopathy, lumbar region: Secondary | ICD-10-CM | POA: Insufficient documentation

## 2020-05-19 DIAGNOSIS — G894 Chronic pain syndrome: Secondary | ICD-10-CM | POA: Diagnosis present

## 2020-05-19 DIAGNOSIS — M25552 Pain in left hip: Secondary | ICD-10-CM | POA: Insufficient documentation

## 2020-05-19 DIAGNOSIS — M25512 Pain in left shoulder: Secondary | ICD-10-CM

## 2020-05-19 DIAGNOSIS — G8929 Other chronic pain: Secondary | ICD-10-CM | POA: Diagnosis present

## 2020-05-19 MED ORDER — HYDROCODONE-ACETAMINOPHEN 5-325 MG PO TABS: 1.0000 | ORAL_TABLET | Freq: Three times a day (TID) | ORAL | 0 refills | Status: DC | PRN

## 2020-05-19 NOTE — Progress Notes (Signed)
PROVIDER NOTE: Information contained herein reflects review and annotations entered in association with encounter. Interpretation of such information and data should be left to medically-trained personnel. Information provided to patient can be located elsewhere in the medical record under "Patient Instructions". Document created using STT-dictation technology, any transcriptional errors that may result from process are unintentional.    Patient: Michael Cobb  Service Category: E/M  Provider: Gillis Santa, MD  DOB: [birth year]  DOS: 05/19/2020  Specialty: Interventional Pain Management  MRN: [id number]  Setting: Ambulatory outpatient  PCP: Leonie Douglas, MD  Type: Established Patient    Referring Provider: Leeanne Rio, MD  Location: Office  Delivery: Face-to-face     HPI  Michael Cobb, an 86 y.o. year old male, is here today because of his Arthralgia of left hip [M25.552]. Michael Cobb primary complain today is Hip Pain (Left ) and Other (Shoulder pain left) Last encounter: My last encounter with him was on 02/17/2020. Pertinent problems: Michael Cobb has DDD (degenerative disc disease), cervical; CKD (chronic kidney disease), stage III ([place]); Chronic left shoulder pain; Lumbar radiculopathy; Lumbar facet arthropathy; Spinal stenosis, lumbar region, with neurogenic claudication; History of lumbar fusion; Chronic radicular lumbar pain; Cervical facet joint syndrome; S/P cervical spinal fusion; Pain management contract signed; and Primary osteoarthritis of left shoulder on their pertinent problem list. Pain Assessment: Severity of Chronic pain is reported as a 5 /10. Location: Hip Left/downside of leg to knee. Onset: More than a month ago. Quality: Aching,Constant,Discomfort. Timing: Constant. Modifying factor(s): Medications. Vitals:  height is _0  (1.778 m) and weight is 243 lb (110.2 kg). His temperature is 97.3 F (36.3 C) (abnormal). His blood pressure is 146/72 (abnormal) and his  pulse is 62. His respiration is 16 and oxygen saturation is 99%.   Reason for encounter: medication management.    No change in medical history since last visit.  Patient's pain is at baseline.  Patient continues multimodal pain regimen as prescribed.  States that it provides pain relief and improvement in functional status.   Pharmacotherapy Assessment   Analgesic:  hydrocodone 5 mg 3 times daily as needed, quantity 90/month; MME equals 15    Monitoring: [place] PMP: PDMP reviewed during this encounter.       Pharmacotherapy: No side-effects or adverse reactions reported. Compliance: No problems identified. Effectiveness: Clinically acceptable.  Ignatius Specking, RN  05/19/2020  9:31 AM  Sign when Signing Visit Nursing Pain Medication Assessment:  Safety precautions to be maintained throughout the outpatient stay will include: orient to surroundings, keep bed in low position, maintain call bell within reach at all times, provide assistance with transfer out of bed and ambulation.  Medication Inspection Compliance: Pill count conducted under aseptic conditions, in front of the patient. Neither the pills nor the bottle was removed from the patient's sight at any time. Once count was completed pills were immediately returned to the patient in their original bottle.  Medication: hydrocodone Pill/Patch Count: 45 of 90 pills remain Pill/Patch Appearance: Markings consistent with prescribed medication Bottle Appearance: Standard pharmacy container. Clearly labeled. Filled Date: 2 / 25 / 2022 Last Medication intake:  Today    UDS:  Summary  Date Value Ref Range Status  04/24/2019 Note  Final    Comment:    ==================================================================== Compliance Drug Analysis, Ur ==================================================================== Test                             Result  Flag       Units Drug Present and Declared for Prescription Verification    Acetaminophen                  PRESENT      EXPECTED   Guaifenesin                    PRESENT      EXPECTED    Guaifenesin may be administered as an over-the-counter or    prescription drug; it may also be present as a breakdown product of    methocarbamol. Drug Absent but Declared for Prescription Verification   Gabapentin                     Not Detected UNEXPECTED   Salicylate                     Not Detected UNEXPECTED    Aspirin, as indicated in the declared medication list, is not always    detected even when used as directed.   Dextromethorphan               Not Detected UNEXPECTED ==================================================================== Test                      Result    Flag   Units      Ref Range   Creatinine              75               mg/dL      >=20 ==================================================================== Declared Medications:  The flagging and interpretation on this report are based on the  following declared medications.  Unexpected results may arise from  inaccuracies in the declared medications.  **Note: The testing scope of this panel includes these medications:  Dextromethorphan (Mucinex DM)  Gabapentin  Guaifenesin (Mucinex DM)  **Note: The testing scope of this panel does not include small to  moderate amounts of these reported medications:  Acetaminophen  Aspirin  **Note: The testing scope of this panel does not include the  following reported medications:  Allopurinol  Amlodipine  Cetirizine  Cyanocobalamin  Docusate  Evolocumab  Fluticasone  Furosemide  Glipizide  Iron  Isosorbide (Imdur)  Meclizine  Omega-3 Fatty Acids  Potassium (Klor-Con)  Ramipril  Tamsulosin ==================================================================== For clinical consultation, please call [phone]. ====================================================================      ROS  Constitutional: Denies any fever or  chills Gastrointestinal: No reported hemesis, hematochezia, vomiting, or acute GI distress Musculoskeletal: Shoulder pain, low back pain Neurological: No reported episodes of acute onset apraxia, aphasia, dysarthria, agnosia, amnesia, paralysis, loss of coordination, or loss of consciousness  Medication Review  Evolocumab, HYDROcodone-acetaminophen, Omega-3 Fatty Acids, acetaminophen, allopurinol, amLODipine, aspirin EC, cetirizine, cyanocobalamin, docusate sodium, empagliflozin, ergocalciferol, escitalopram, ferrous sulfate, fluticasone, furosemide, gabapentin, glipiZIDE, isosorbide mononitrate, meclizine, potassium chloride SA, ramipril, and tamsulosin  History Review  Allergy: Michael Cobb is allergic to metformin and morphine and related. Drug: Michael Cobb  reports no history of drug use. Alcohol:  reports no history of alcohol use. Tobacco:  reports that he has never smoked. He has never used smokeless tobacco. Social: Michael Cobb  reports that he has never smoked. He has never used smokeless tobacco. He reports that he does not drink alcohol and does not use drugs. Medical:  has a past medical history of BPH (benign prostatic hyperplasia), CAD (coronary artery disease), Chronic renal insufficiency, stage 3 (  moderate) ([place]), Essential hypertension, History of skin cancer, Ischemic cardiomyopathy, Mixed hyperlipidemia, Non-ST elevated myocardial infarction (non-STEMI) ([place]), Statin intolerance, and Type 2 diabetes mellitus ([place]). Surgical: Michael Cobb  has a past surgical history that includes Appendectomy; Back surgery; Surgery of lip; and LEFT HEART CATH AND CORONARY ANGIOGRAPHY (N/A, 06/27/2017). Family: family history includes CVA in his father; Hypertension in his father.  Laboratory Chemistry Profile   Renal Lab Results  Component Value Date   BUN 31 (H) 04/27/2019   CREATININE 1.66 (H) 04/27/2019   GFRAA 44 (L) 04/27/2019   GFRNONAA 38 (L) 04/27/2019     Hepatic Lab Results   Component Value Date   AST 20 12/29/2018   ALT 18 12/29/2018   ALBUMIN 3.0 (L) 12/29/2018   ALKPHOS 69 12/29/2018     Electrolytes Lab Results  Component Value Date   NA 137 04/27/2019   K 6.3 (HH) 04/27/2019   CL 111 04/27/2019   CALCIUM 8.9 04/27/2019   MG 1.9 12/31/2018     Bone No results found for: VD25OH, VO160VP7TGG, YI9485IO2, VO3500XF8, 25OHVITD1, 25OHVITD2, 25OHVITD3, TESTOFREE, TESTOSTERONE   Inflammation (CRP: Acute Phase) (ESR: Chronic Phase) Lab Results  Component Value Date   CRP 2.4 (H) 11/20/2018   ESRSEDRATE 72 (H) 11/20/2018   LATICACIDVEN 1.7 09/09/2017       Note: Above Lab results reviewed.  Recent Imaging Review  ECHOCARDIOGRAM COMPLETE    ECHOCARDIOGRAM REPORT       Patient Name:   DARYON REMMERT Date of Exam: 04/09/2020 Medical Rec #:  [id number]        Height:       70.0 in Accession #:    [id number]       Weight:       243.0 lb Date of Birth:  [birth year]         BSA:          2.267 m Patient Age:    41 years         BP:           122/62 mmHg Patient Gender: M                HR:           75 bpm. Exam Location:  [facility]  Procedure: 2D Echo, Cardiac Doppler and Color Doppler  Indications:     I25.5 Ischemic cardiomyopathy   History:         Patient has prior history of Echocardiogram examinations, most                  recent 12/29/2018. Cardiomyopathy and CHF, CAD, CKD; Risk                  Factors:Hypertension, Diabetes, Dyslipidemia and Non-Smoker.   Sonographer:     Jeneen Montgomery RDMS, RVT, RDCS Referring Phys:  [place] Diagnosing Phys: Carlyle Dolly MD  IMPRESSIONS   1. Left ventricular ejection fraction, by estimation, is 40 to 45%. The left ventricle has mildly decreased function. The left ventricle demonstrates global hypokinesis. Left ventricular diastolic parameters are consistent with Grade I diastolic  dysfunction (impaired relaxation).  2. Right ventricular systolic function is normal. The right  ventricular size is normal.  3. Left atrial size was mildly dilated.  4. The mitral valve is normal in structure. No evidence of mitral valve regurgitation. No evidence of mitral stenosis.  5. The aortic valve is tricuspid. There is mild calcification of the aortic valve. There  is mild thickening of the aortic valve. Aortic valve regurgitation is not visualized. No aortic stenosis is present.  Comparison(s): Echocardiogram done 12/29/18 showed an EF of 40-45%.  FINDINGS  Left Ventricle: Left ventricular ejection fraction, by estimation, is 40 to 45%. The left ventricle has mildly decreased function. The left ventricle demonstrates global hypokinesis. The left ventricular internal cavity size was normal in size. There is  no left ventricular hypertrophy. Left ventricular diastolic parameters are consistent with Grade I diastolic dysfunction (impaired relaxation). Normal left ventricular filling pressure.  Right Ventricle: The right ventricular size is normal. No increase in right ventricular wall thickness. Right ventricular systolic function is normal.  Left Atrium: Left atrial size was mildly dilated.  Right Atrium: Right atrial size was normal in size.  Pericardium: The pericardium was not well visualized.  Mitral Valve: The mitral valve is normal in structure. No evidence of mitral valve regurgitation. No evidence of mitral valve stenosis.  Tricuspid Valve: The tricuspid valve is normal in structure. Tricuspid valve regurgitation is not demonstrated. No evidence of tricuspid stenosis.  Aortic Valve: The aortic valve is tricuspid. There is mild calcification of the aortic valve. There is mild thickening of the aortic valve. There is mild aortic valve annular calcification. Aortic valve regurgitation is not visualized. No aortic stenosis  is present. Aortic valve mean gradient measures 5.3 mmHg. Aortic valve peak gradient measures 10.4 mmHg. Aortic valve area, by VTI measures 1.66  cm.  Pulmonic Valve: The pulmonic valve was not well visualized. Pulmonic valve regurgitation is not visualized. No evidence of pulmonic stenosis.  Aorta: The aortic root is normal in size and structure.  IAS/Shunts: The interatrial septum was not well visualized.    LEFT VENTRICLE PLAX 2D LVIDd:         5.40 cm      Diastology LVIDs:         4.17 cm      LV e' medial:    4.53 cm/s LV PW:         1.00 cm      LV E/e' medial:  14.9 LV IVS:        0.98 cm      LV e' lateral:   9.41 cm/s LVOT diam:     1.80 cm      LV E/e' lateral: 7.2 LV SV:         58 LV SV Index:   26 LVOT Area:     2.54 cm   LV Volumes (MOD) LV vol d, MOD A2C: 105.5 ml LV vol d, MOD A4C: 99.5 ml LV vol s, MOD A2C: 59.2 ml LV vol s, MOD A4C: 58.9 ml LV SV MOD A2C:     46.3 ml LV SV MOD A4C:     99.5 ml LV SV MOD BP:      43.9 ml  RIGHT VENTRICLE RV S prime:     11.00 cm/s TAPSE (M-mode): 1.6 cm  LEFT ATRIUM             Index       RIGHT ATRIUM           Index LA diam:        3.70 cm 1.63 cm/m  RA Area:     15.10 cm LA Vol (A2C):   64.6 ml 28.49 ml/m RA Volume:   37.10 ml  16.36 ml/m LA Vol (A4C):   75.1 ml 33.12 ml/m LA Biplane Vol: 69.8 ml 30.78 ml/m  AORTIC VALVE AV  Area (Vmax):    1.72 cm AV Area (Vmean):   1.66 cm AV Area (VTI):     1.66 cm AV Vmax:           161.53 cm/s AV Vmean:          109.594 cm/s AV VTI:            0.350 m AV Peak Grad:      10.4 mmHg AV Mean Grad:      5.3 mmHg LVOT Vmax:         109.00 cm/s LVOT Vmean:        71.700 cm/s LVOT VTI:          0.229 m LVOT/AV VTI ratio: 0.65   AORTA Ao Root diam: 3.60 cm  MITRAL VALVE                TRICUSPID VALVE MV Area (PHT):              TR Peak grad:   5.2 mmHg MV Decel Time: 351 msec     TR Vmax:        114.00 cm/s MR Peak grad: 22.7 mmHg MR Vmax:      238.00 cm/s   SHUNTS MV E velocity: 67.30 cm/s   Systemic VTI:  0.23 m MV A velocity: 102.00 cm/s  Systemic Diam: 1.80 cm MV E/A ratio:  0.66  Carlyle Dolly  MD Electronically signed by Carlyle Dolly MD Signature Date/Time: 04/09/2020/3:33:41 PM      Final   Note: Reviewed        Physical Exam  General appearance: Well nourished, well developed, and well hydrated. In no apparent acute distress Mental status: Alert, oriented x 3 (person, place, & time)       Respiratory: No evidence of acute respiratory distress Eyes: PERLA Vitals: BP (!) 146/72   Pulse 62   Temp (!) 97.3 F (36.3 C)   Resp 16   Ht _0  (1.778 m)   Wt 243 lb (110.2 kg)   SpO2 99%   BMI 34.87 kg/m  BMI: Estimated body mass index is 34.87 kg/m as calculated from the following:   Height as of this encounter: _1  (1.778 m).   Weight as of this encounter: 243 lb (110.2 kg). Ideal: Ideal body weight: 73 kg (160 lb 15 oz) Adjusted ideal body weight: 87.9 kg (193 lb 12.2 oz)   Lumbar Exam  Skin & Axial Inspection:Well healed scar from previous spine surgery detected Alignment:Symmetrical Functional WIO:MBTD restricted ROMaffecting both sides Stability:No instability detected Muscle Tone/Strength:Functionally intact. No obvious neuro-muscular anomalies detected. Sensory (Neurological):Dermatomal pain pattern Palpation:No palpable anomalies Provocative Tests: Hyperextension/rotation test:deferred today Lumbar quadrant test (Kemp's test):(+)bilateral for foraminal stenosis Lateral bending test:(+)ipsilateral radicular pain, bilaterally. Positive for bilateral foraminal stenosis  Gait & Posture Assessment  Ambulation:Patient ambulates using a cane Gait:Antalgic Posture:Difficulty standing up straight, due to pain  Lower Extremity Exam    Side:Right lower extremity  Side:Left lower extremity  Stability:No instability observed  Stability:No instability observed  Skin & Extremity Inspection:Skin color, temperature, and hair growth are WNL. No peripheral edema or cyanosis. No masses, redness, swelling,  asymmetry, or associated skin lesions. No contractures.  Skin & Extremity Inspection:Skin color, temperature, and hair growth are WNL. No peripheral edema or cyanosis. No masses, redness, swelling, asymmetry, or associated skin lesions. No contractures.  Functional HRC:BULA restricted ROMfor all joints of the lower extremity   Functional GTX:MIWO restricted ROMfor all joints of the lower extremity  Muscle Tone/Strength:Functionally intact. No obvious neuro-muscular anomalies detected.  Muscle Tone/Strength:Functionally intact. No obvious neuro-muscular anomalies detected.  Sensory (Neurological):Dermatomal pain pattern  Sensory (Neurological):Dermatomal pain pattern  DTR: Patellar:deferred today Achilles:deferred today Plantar:deferred today  DTR: Patellar:deferred today Achilles:deferred today Plantar:deferred today  Palpation:No palpable anomalies  Palpation:No palpable anomalies   Assessment   Status Diagnosis  Controlled Controlled Controlled 1. Arthralgia of left hip   2. Left hip pain   3. Chronic radicular lumbar pain   4. Lumbar radiculopathy   5. History of lumbar fusion   6. Chronic left shoulder pain   7. Chronic pain syndrome       Plan of Care  Michael Cobb has a current medication list which includes the following long-term medication(s): amlodipine, cetirizine, escitalopram, ferrous sulfate, fluticasone, furosemide, glipizide, isosorbide mononitrate, potassium chloride sa, ramipril, gabapentin, [START ON 05/31/2020] hydrocodone-acetaminophen, [START ON 06/30/2020] hydrocodone-acetaminophen, and [START ON 07/30/2020] hydrocodone-acetaminophen.  Pharmacotherapy (Medications Ordered): Meds ordered this encounter  Medications  . HYDROcodone-acetaminophen (NORCO/VICODIN) 5-325 MG tablet    Sig: Take 1 tablet by mouth every 8 (eight) hours as needed for severe pain. Must last 30 days.    Dispense:  90 tablet     Refill:  0    Chronic Pain. (STOP Act - Not applicable). Fill one day early if closed on scheduled refill date.  Marland Kitchen HYDROcodone-acetaminophen (NORCO/VICODIN) 5-325 MG tablet    Sig: Take 1 tablet by mouth every 8 (eight) hours as needed for severe pain. Must last 30 days.    Dispense:  90 tablet    Refill:  0    Chronic Pain. (STOP Act - Not applicable). Fill one day early if closed on scheduled refill date.  Marland Kitchen HYDROcodone-acetaminophen (NORCO/VICODIN) 5-325 MG tablet    Sig: Take 1 tablet by mouth every 8 (eight) hours as needed for severe pain. Must last 30 days.    Dispense:  90 tablet    Refill:  0    Chronic Pain. (STOP Act - Not applicable). Fill one day early if closed on scheduled refill date.   Orders:  Orders Placed This Encounter  Procedures  . Lumbar Epidural Injection    Standing Status:   Standing    Number of Occurrences:   9    Standing Expiration Date:   05/19/2021    Scheduling Instructions:     Purpose: Palliative     Indication: Lower extremity pain/Sciatica unspecified side (M54.30).     Side: Midline     Level: TBD     Sedation: Patient's choice.     TIMEFRAME: PRN procedure. (Mr. Wesch will call when needed.)    Order Specific Question:   Where will this procedure be performed?    Answer:   [facility]  . ToxASSURE Select 13 (MW), Urine    Volume: 30 ml(s). Minimum 3 ml of urine is needed. Document temperature of fresh sample. Indications: Long term (current) use of opiate analgesic (D22.025)    Order Specific Question:   Release to patient    Answer:   Immediate   Continue gabapentin as prescribed.  No refills needed.  Follow-up plan:   Return in about 3 months (around 08/19/2020) for Medication Management, in person.      Interventional management options:  Considering:   S/p L2/3 ESI 08/26/19, 01/06/20: Significant pain relief, repeat as needed Status post left intra-articular hip steroid injection 02/17/2020  Recent Visits Date Type  Provider Dept  03/17/20 Telemedicine Gillis Santa, MD [facility]  Showing recent visits within past 90 days and meeting all other requirements Today's Visits Date Type Provider Dept  05/19/20 Office Visit Gillis Santa, MD [facility]  Showing today's visits and meeting all other requirements Future Appointments No visits were found meeting these conditions. Showing future appointments within next 90 days and meeting all other requirements  I discussed the assessment and treatment plan with the patient. The patient was provided an opportunity to ask questions and all were answered. The patient agreed with the plan and demonstrated an understanding of the instructions.  Patient advised to call back or seek an in-person evaluation if the symptoms or condition worsens.  Duration of encounter:30 minutes.  Note by: Gillis Santa, MD Date: 05/19/2020; Time: 9:41 AM

## 2020-05-19 NOTE — Progress Notes (Deleted)
PROVIDER NOTE: Information contained herein reflects review and annotations entered in association with encounter. Interpretation of such information and data should be left to medically-trained personnel. Information provided to patient can be located elsewhere in the medical record under "Patient Instructions". Document created using STT-dictation technology, any transcriptional errors that may result from process are unintentional.    Patient: Michael Cobb  Service Category: E/M  Provider: Gillis Santa, MD  DOB: 02/05/1935  DOS: 05/19/2020  Specialty: Interventional Pain Management  MRN: [id number]  Setting: Ambulatory outpatient  PCP: Leonie Douglas, MD  Type: Established Patient    Referring Provider: Leeanne Rio, MD  Location: Office  Delivery: Face-to-face     HPI  [name], a 86 y.o. year old male, is here today because of his No primary diagnosis found.. Michael Cobb primary complain today is Hip Pain (Left ) and Other (Shoulder pain left) Last encounter: My last encounter with him was on 02/17/2020. Pertinent problems: Michael Cobb has DDD (degenerative disc disease), cervical; CKD (chronic kidney disease), stage III ([place]); Chronic left shoulder pain; Lumbar radiculopathy; Lumbar facet arthropathy; Spinal stenosis, lumbar region, with neurogenic claudication; History of lumbar fusion; Chronic radicular lumbar pain; Cervical facet joint syndrome; S/P cervical spinal fusion; Pain management contract signed; and Primary osteoarthritis of left shoulder on their pertinent problem list. Pain Assessment: Severity of Chronic pain is reported as a 5 /10. Location: Hip Left/downside of leg to knee. Onset: More than a month ago. Quality: Aching,Constant,Discomfort. Timing: Constant. Modifying factor(s): Medications. Vitals:  height is _0  (1.778 m) and weight is 243 lb (110.2 kg). His temperature is 97.3 F (36.3 C) (abnormal). His blood pressure is 146/72 (abnormal) and his pulse  is 62. His respiration is 16 and oxygen saturation is 99%.   Reason for encounter: medication management.    No change in medical history since last visit.  Patient's pain is at baseline.  Patient continues multimodal pain regimen as prescribed.  States that it provides pain relief and improvement in functional status.   Pharmacotherapy Assessment   Analgesic:  hydrocodone 5 mg 3 times daily as needed, quantity 90/month; MME equals 15    Monitoring: [place] PMP: PDMP not reviewed this encounter.       Pharmacotherapy: No side-effects or adverse reactions reported. Compliance: No problems identified. Effectiveness: Clinically acceptable.  Ignatius Specking, RN  05/19/2020  9:31 AM  Sign when Signing Visit Nursing Pain Medication Assessment:  Safety precautions to be maintained throughout the outpatient stay will include: orient to surroundings, keep bed in low position, maintain call bell within reach at all times, provide assistance with transfer out of bed and ambulation.  Medication Inspection Compliance: Pill count conducted under aseptic conditions, in front of the patient. Neither the pills nor the bottle was removed from the patient's sight at any time. Once count was completed pills were immediately returned to the patient in their original bottle.  Medication: hydrocodone Pill/Patch Count: 45 of 90 pills remain Pill/Patch Appearance: Markings consistent with prescribed medication Bottle Appearance: Standard pharmacy container. Clearly labeled. Filled Date: 2 / 25 / 2022 Last Medication intake:  Today   UDS:  Summary  Date Value Ref Range Status  04/24/2019 Note  Final    Comment:    ==================================================================== Compliance Drug Analysis, Ur ==================================================================== Test                             Result  Flag       Units Drug Present and Declared for Prescription Verification    Acetaminophen                  PRESENT      EXPECTED   Guaifenesin                    PRESENT      EXPECTED    Guaifenesin may be administered as an over-the-counter or    prescription drug; it may also be present as a breakdown product of    methocarbamol. Drug Absent but Declared for Prescription Verification   Gabapentin                     Not Detected UNEXPECTED   Salicylate                     Not Detected UNEXPECTED    Aspirin, as indicated in the declared medication list, is not always    detected even when used as directed.   Dextromethorphan               Not Detected UNEXPECTED ==================================================================== Test                      Result    Flag   Units      Ref Range   Creatinine              75               mg/dL      >=20 ==================================================================== Declared Medications:  The flagging and interpretation on this report are based on the  following declared medications.  Unexpected results may arise from  inaccuracies in the declared medications.  **Note: The testing scope of this panel includes these medications:  Dextromethorphan (Mucinex DM)  Gabapentin  Guaifenesin (Mucinex DM)  **Note: The testing scope of this panel does not include small to  moderate amounts of these reported medications:  Acetaminophen  Aspirin  **Note: The testing scope of this panel does not include the  following reported medications:  Allopurinol  Amlodipine  Cetirizine  Cyanocobalamin  Docusate  Evolocumab  Fluticasone  Furosemide  Glipizide  Iron  Isosorbide (Imdur)  Meclizine  Omega-3 Fatty Acids  Potassium (Klor-Con)  Ramipril  Tamsulosin ==================================================================== For clinical consultation, please call [phone]. ====================================================================      ROS  Constitutional: Denies any fever or  chills Gastrointestinal: No reported hemesis, hematochezia, vomiting, or acute GI distress Musculoskeletal: Denies any acute onset joint swelling, redness, loss of ROM, or weakness Neurological: No reported episodes of acute onset apraxia, aphasia, dysarthria, agnosia, amnesia, paralysis, loss of coordination, or loss of consciousness  Medication Review  Evolocumab, HYDROcodone-acetaminophen, Omega-3 Fatty Acids, acetaminophen, allopurinol, amLODipine, aspirin EC, cetirizine, cyanocobalamin, docusate sodium, empagliflozin, ergocalciferol, escitalopram, ferrous sulfate, fluticasone, furosemide, gabapentin, glipiZIDE, isosorbide mononitrate, meclizine, potassium chloride SA, ramipril, and tamsulosin  History Review  Allergy: Michael Cobb is allergic to metformin and morphine and related. Drug: Michael Cobb  reports no history of drug use. Alcohol:  reports no history of alcohol use. Tobacco:  reports that he has never smoked. He has never used smokeless tobacco. Social: Michael Cobb  reports that he has never smoked. He has never used smokeless tobacco. He reports that he does not drink alcohol and does not use drugs. Medical:  has a past medical history of BPH (benign prostatic hyperplasia), CAD (coronary  artery disease), Chronic renal insufficiency, stage 3 (moderate) ([place]), Essential hypertension, History of skin cancer, Ischemic cardiomyopathy, Mixed hyperlipidemia, Non-ST elevated myocardial infarction (non-STEMI) ([place]), Statin intolerance, and Type 2 diabetes mellitus ([place]). Surgical: Michael Cobb  has a past surgical history that includes Appendectomy; Back surgery; Surgery of lip; and LEFT HEART CATH AND CORONARY ANGIOGRAPHY (N/A, 06/27/2017). Family: family history includes CVA in his father; Hypertension in his father.  Laboratory Chemistry Profile   Renal Lab Results  Component Value Date   BUN 31 (H) 04/27/2019   CREATININE 1.66 (H) 04/27/2019   GFRAA 44 (L) 04/27/2019   GFRNONAA 38 (L)  04/27/2019     Hepatic Lab Results  Component Value Date   AST 20 12/29/2018   ALT 18 12/29/2018   ALBUMIN 3.0 (L) 12/29/2018   ALKPHOS 69 12/29/2018     Electrolytes Lab Results  Component Value Date   NA 137 04/27/2019   K 6.3 (HH) 04/27/2019   CL 111 04/27/2019   CALCIUM 8.9 04/27/2019   MG 1.9 12/31/2018     Bone No results found for: VD25OH, [id number], ME1583EN4, MH6808UP1, 25OHVITD1, 25OHVITD2, 25OHVITD3, TESTOFREE, TESTOSTERONE   Inflammation (CRP: Acute Phase) (ESR: Chronic Phase) Lab Results  Component Value Date   CRP 2.4 (H) 11/20/2018   ESRSEDRATE 72 (H) 11/20/2018   LATICACIDVEN 1.7 09/09/2017       Note: Above Lab results reviewed.  Recent Imaging Review  ECHOCARDIOGRAM COMPLETE    ECHOCARDIOGRAM REPORT       Patient Name:   SAN LOHMEYER Date of Exam: 04/09/2020 Medical Rec #:  [id number]        Height:       70.0 in Accession #:    [id number]       Weight:       243.0 lb Date of Birth:  [birth year]         BSA:          2.267 m Patient Age:    3 years         BP:           122/62 mmHg Patient Gender: M                HR:           75 bpm. Exam Location:  [facility]  Procedure: 2D Echo, Cardiac Doppler and Color Doppler  Indications:     I25.5 Ischemic cardiomyopathy   History:         Patient has prior history of Echocardiogram examinations, most                  recent 12/29/2018. Cardiomyopathy and CHF, CAD, CKD; Risk                  Factors:Hypertension, Diabetes, Dyslipidemia and Non-Smoker.   Sonographer:     Jeneen Montgomery RDMS, RVT, RDCS Referring Phys:  [place] Diagnosing Phys: Carlyle Dolly MD  IMPRESSIONS   1. Left ventricular ejection fraction, by estimation, is 40 to 45%. The left ventricle has mildly decreased function. The left ventricle demonstrates global hypokinesis. Left ventricular diastolic parameters are consistent with Grade I diastolic  dysfunction (impaired relaxation).  2. Right ventricular systolic  function is normal. The right ventricular size is normal.  3. Left atrial size was mildly dilated.  4. The mitral valve is normal in structure. No evidence of mitral valve regurgitation. No evidence of mitral stenosis.  5. The aortic valve is tricuspid. There is  mild calcification of the aortic valve. There is mild thickening of the aortic valve. Aortic valve regurgitation is not visualized. No aortic stenosis is present.  Comparison(s): Echocardiogram done 12/29/18 showed an EF of 40-45%.  FINDINGS  Left Ventricle: Left ventricular ejection fraction, by estimation, is 40 to 45%. The left ventricle has mildly decreased function. The left ventricle demonstrates global hypokinesis. The left ventricular internal cavity size was normal in size. There is  no left ventricular hypertrophy. Left ventricular diastolic parameters are consistent with Grade I diastolic dysfunction (impaired relaxation). Normal left ventricular filling pressure.  Right Ventricle: The right ventricular size is normal. No increase in right ventricular wall thickness. Right ventricular systolic function is normal.  Left Atrium: Left atrial size was mildly dilated.  Right Atrium: Right atrial size was normal in size.  Pericardium: The pericardium was not well visualized.  Mitral Valve: The mitral valve is normal in structure. No evidence of mitral valve regurgitation. No evidence of mitral valve stenosis.  Tricuspid Valve: The tricuspid valve is normal in structure. Tricuspid valve regurgitation is not demonstrated. No evidence of tricuspid stenosis.  Aortic Valve: The aortic valve is tricuspid. There is mild calcification of the aortic valve. There is mild thickening of the aortic valve. There is mild aortic valve annular calcification. Aortic valve regurgitation is not visualized. No aortic stenosis  is present. Aortic valve mean gradient measures 5.3 mmHg. Aortic valve peak gradient measures 10.4 mmHg. Aortic valve area,  by VTI measures 1.66 cm.  Pulmonic Valve: The pulmonic valve was not well visualized. Pulmonic valve regurgitation is not visualized. No evidence of pulmonic stenosis.  Aorta: The aortic root is normal in size and structure.  IAS/Shunts: The interatrial septum was not well visualized.    LEFT VENTRICLE PLAX 2D LVIDd:         5.40 cm      Diastology LVIDs:         4.17 cm      LV e' medial:    4.53 cm/s LV PW:         1.00 cm      LV E/e' medial:  14.9 LV IVS:        0.98 cm      LV e' lateral:   9.41 cm/s LVOT diam:     1.80 cm      LV E/e' lateral: 7.2 LV SV:         58 LV SV Index:   26 LVOT Area:     2.54 cm   LV Volumes (MOD) LV vol d, MOD A2C: 105.5 ml LV vol d, MOD A4C: 99.5 ml LV vol s, MOD A2C: 59.2 ml LV vol s, MOD A4C: 58.9 ml LV SV MOD A2C:     46.3 ml LV SV MOD A4C:     99.5 ml LV SV MOD BP:      43.9 ml  RIGHT VENTRICLE RV S prime:     11.00 cm/s TAPSE (M-mode): 1.6 cm  LEFT ATRIUM             Index       RIGHT ATRIUM           Index LA diam:        3.70 cm 1.63 cm/m  RA Area:     15.10 cm LA Vol (A2C):   64.6 ml 28.49 ml/m RA Volume:   37.10 ml  16.36 ml/m LA Vol (A4C):   75.1 ml 33.12 ml/m LA Biplane Vol: 69.8  ml 30.78 ml/m  AORTIC VALVE AV Area (Vmax):    1.72 cm AV Area (Vmean):   1.66 cm AV Area (VTI):     1.66 cm AV Vmax:           161.53 cm/s AV Vmean:          109.594 cm/s AV VTI:            0.350 m AV Peak Grad:      10.4 mmHg AV Mean Grad:      5.3 mmHg LVOT Vmax:         109.00 cm/s LVOT Vmean:        71.700 cm/s LVOT VTI:          0.229 m LVOT/AV VTI ratio: 0.65   AORTA Ao Root diam: 3.60 cm  MITRAL VALVE                TRICUSPID VALVE MV Area (PHT):              TR Peak grad:   5.2 mmHg MV Decel Time: 351 msec     TR Vmax:        114.00 cm/s MR Peak grad: 22.7 mmHg MR Vmax:      238.00 cm/s   SHUNTS MV E velocity: 67.30 cm/s   Systemic VTI:  0.23 m MV A velocity: 102.00 cm/s  Systemic Diam: 1.80 cm MV E/A ratio:   0.66  Carlyle Dolly MD Electronically signed by Carlyle Dolly MD Signature Date/Time: 04/09/2020/3:33:41 PM      Final   Note: Reviewed        Physical Exam  General appearance: Well nourished, well developed, and well hydrated. In no apparent acute distress Mental status: Alert, oriented x 3 (person, place, & time)       Respiratory: No evidence of acute respiratory distress Eyes: PERLA Vitals: BP (!) 146/72   Pulse 62   Temp (!) 97.3 F (36.3 C)   Resp 16   Ht _0  (1.778 m)   Wt 243 lb (110.2 kg)   SpO2 99%   BMI 34.87 kg/m  BMI: Estimated body mass index is 34.87 kg/m as calculated from the following:   Height as of this encounter: _1  (1.778 m).   Weight as of this encounter: 243 lb (110.2 kg). Ideal: Ideal body weight: 73 kg (160 lb 15 oz) Adjusted ideal body weight: 87.9 kg (193 lb 12.2 oz)  Assessment   Status Diagnosis  Controlled Controlled Controlled No diagnosis found.   Updated Problems: No problems updated.  Plan of Care  Problem-specific:  No problem-specific Assessment & Plan notes found for this encounter.  Michael Cobb has a current medication list which includes the following long-term medication(s): amlodipine, cetirizine, escitalopram, ferrous sulfate, fluticasone, furosemide, glipizide, hydrocodone-acetaminophen, isosorbide mononitrate, potassium chloride sa, ramipril, and gabapentin.  Pharmacotherapy (Medications Ordered): No orders of the defined types were placed in this encounter.  Orders:  No orders of the defined types were placed in this encounter.  Follow-up plan:   No follow-ups on file.      Interventional management options:  Considering:   S/p L2/3 ESI 08/26/19, 01/06/20: Significant pain relief, repeat as needed Status post left intra-articular hip steroid injection 02/17/2020   Recent Visits Date Type Provider Dept  02/17/20 Procedure visit Gillis Santa, MD [facility]  01/27/20 Telemedicine  Gillis Santa, MD [facility]  01/06/20 Procedure visit Gillis Santa, MD [facility]  Showing recent  visits within past 90 days and meeting all other requirements Today's Visits Date Type Provider Dept  03/17/20 Telemedicine Gillis Santa, MD [facility]  Showing today's visits and meeting all other requirements Future Appointments Date Type Provider Dept  05/19/20 Appointment Gillis Santa, MD [facility]  Showing future appointments within next 90 days and meeting all other requirements  I discussed the assessment and treatment plan with the patient. The patient was provided an opportunity to ask questions and all were answered. The patient agreed with the plan and demonstrated an understanding of the instructions.  Patient advised to call back or seek an in-person evaluation if the symptoms or condition worsens.  Duration of encounter: 20 minutes.  Note by: Gillis Santa, MD Date: 03/17/2020; Time: 2:48 PM    Recent Visits Date Type Provider Dept  03/17/20 Telemedicine Gillis Santa, MD [facility]  Showing recent visits within past 90 days and meeting all other requirements Today's Visits Date Type Provider Dept  05/19/20 Office Visit Gillis Santa, MD [facility]  Showing today's visits and meeting all other requirements Future Appointments No visits were found meeting these conditions. Showing future appointments within next 90 days and meeting all other requirements  I discussed the assessment and treatment plan with the patient. The patient was provided an opportunity to ask questions and all were answered. The patient agreed with the plan and demonstrated an understanding of the instructions.  Patient advised to call back or seek an in-person evaluation if the symptoms or condition worsens.  Duration of encounter: *** minutes.  Note by: Gillis Santa, MD Date: 05/19/2020; Time: 9:35 AM

## 2020-05-19 NOTE — Patient Instructions (Signed)
____________________________________________________________________________________________  Preparing for your procedure (without sedation)  Procedure appointments are limited to planned procedures: . No Prescription Refills. . No disability issues will be discussed. . No medication changes will be discussed.  Instructions: . Oral Intake: Do not eat or drink anything for at least 6 hours prior to your procedure. (Exception: Blood Pressure Medication. See below.) . Transportation: Unless otherwise stated by your physician, you may drive yourself after the procedure. . Blood Pressure Medicine: Do not forget to take your blood pressure medicine with a sip of water the morning of the procedure. If your Diastolic (lower reading)is above 100 mmHg, elective cases will be cancelled/rescheduled. . Blood thinners: These will need to be stopped for procedures. Notify our staff if you are taking any blood thinners. Depending on which one you take, there will be specific instructions on how and when to stop it. . Diabetics on insulin: Notify the staff so that you can be scheduled 1st case in the morning. If your diabetes requires high dose insulin, take only  of your normal insulin dose the morning of the procedure and notify the staff that you have done so. . Preventing infections: Shower with an antibacterial soap the morning of your procedure.  . Build-up your immune system: Take 1000 mg of Vitamin C with every meal (3 times a day) the day prior to your procedure. . Antibiotics: Inform the staff if you have a condition or reason that requires you to take antibiotics before dental procedures. . Pregnancy: If you are pregnant, call and cancel the procedure. . Sickness: If you have a cold, fever, or any active infections, call and cancel the procedure. . Arrival: You must be in the facility at least 30 minutes prior to your scheduled procedure. . Children: Do not bring any children with you. . Dress  appropriately: Bring dark clothing that you would not mind if they get stained. . Valuables: Do not bring any jewelry or valuables.  Reasons to call and reschedule or cancel your procedure: (Following these recommendations will minimize the risk of a serious complication.) . Surgeries: Avoid having procedures within 2 weeks of any surgery. (Avoid for 2 weeks before or after any surgery). . Flu Shots: Avoid having procedures within 2 weeks of a flu shots or . (Avoid for 2 weeks before or after immunizations). . Barium: Avoid having a procedure within 7-10 days after having had a radiological study involving the use of radiological contrast. (Myelograms, Barium swallow or enema study). . Heart attacks: Avoid any elective procedures or surgeries for the initial 6 months after a "Myocardial Infarction" (Heart Attack). . Blood thinners: It is imperative that you stop these medications before procedures. Let us know if you if you take any blood thinner.  . Infection: Avoid procedures during or within two weeks of an infection (including chest colds or gastrointestinal problems). Symptoms associated with infections include: Localized redness, fever, chills, night sweats or profuse sweating, burning sensation when voiding, cough, congestion, stuffiness, runny nose, sore throat, diarrhea, nausea, vomiting, cold or Flu symptoms, recent or current infections. It is specially important if the infection is over the area that we intend to treat. . Heart and lung problems: Symptoms that may suggest an active cardiopulmonary problem include: cough, chest pain, breathing difficulties or shortness of breath, dizziness, ankle swelling, uncontrolled high or unusually low blood pressure, and/or palpitations. If you are experiencing any of these symptoms, cancel your procedure and contact your primary care physician for an evaluation.  Remember:  Regular   Business hours are:  Monday to Thursday 8:00 AM to 4:00  PM  Provider's Schedule: Milinda Pointer, MD:  Procedure days: Tuesday and Thursday 7:30 AM to 4:00 PM  Gillis Santa, MD:  Procedure days: Monday and Wednesday 7:30 AM to 4:00 PM ____________________________________________________________________________________________   Epidural Steroid Injection  An epidural steroid injection is a shot of steroid medicine and numbing medicine that is given into the space between the spinal cord and the bones of the back (epidural space). The shot helps relieve pain caused by an irritated or swollen nerve root. The amount of pain relief you get from the injection depends on what is causing the nerve to be swollen and irritated, and how long your pain lasts. You are more likely to benefit from this injection if your pain is strong and comes on suddenly rather than if you have had long-term (chronic) pain. Tell a health care provider about:  Any allergies you have.  All medicines you are taking, including vitamins, herbs, eye drops, creams, and over-the-counter medicines.  Any problems you or family members have had with anesthetic medicines.  Any blood disorders you have.  Any surgeries you have had.  Any medical conditions you have.  Whether you are pregnant or may be pregnant. What are the risks? Generally, this is a safe procedure. However, problems may occur, including:  Headache.  Bleeding.  Infection.  Allergic reaction to medicines.  Nerve damage. What happens before the procedure? Staying hydrated Follow instructions from your health care provider about hydration, which may include:  Up to 2 hours before the procedure - you may continue to drink clear liquids, such as water, clear fruit juice, black coffee, and plain tea. Eating and drinking restrictions Follow instructions from your health care provider about eating and drinking, which may include:  8 hours before the procedure - stop eating heavy meals or foods, such as  meat, fried foods, or fatty foods.  6 hours before the procedure - stop eating light meals or foods, such as toast or cereal.  6 hours before the procedure - stop drinking milk or drinks that contain milk.  2 hours before the procedure - stop drinking clear liquids. Medicines  You may be given medicines to lower anxiety.  Ask your health care provider about: ? Changing or stopping your regular medicines. This is especially important if you are taking diabetes medicines or blood thinners. ? Taking medicines such as aspirin and ibuprofen. These medicines can thin your blood. Do not take these medicines unless your health care provider tells you to take them. ? Taking over-the-counter medicines, vitamins, herbs, and supplements. General instructions  Ask your health care provider what steps will be taken to prevent infection.  Plan to have a responsible adult take you home from the hospital or clinic.  If you will be going home right after the procedure, plan to have a responsible adult care for you for the time you are told. This is important. What happens during the procedure?  An IV will be inserted into one of your veins.  You will be given one or more of the following: ? A medicine to help you relax (sedative). ? A medicine to numb the area (local anesthetic).  You will be asked to lie on your abdomen or sit.  The injection site will be cleaned.  A needle will be inserted through your skin into the epidural space. This may cause you some discomfort. An X-ray machine will be used to guide the  needle as close as possible to the affected nerve.  A steroid medicine and a local anesthetic will be injected into the epidural space.  The needle and IV will be removed.  A bandage (dressing) will be put over the injection site. The procedure may vary among health care providers and hospitals. What can I expect after the procedure?  Your blood pressure, heart rate, breathing rate,  and blood oxygen level will be monitored until you leave the hospital or clinic.  Your arm or leg may feel weak or numb for a few hours.  The injection site may feel sore. Follow these instructions at home: Injection site care  You may remove the bandage (dressing) after 24 hours.  Check your injection site every day for signs of infection. Check for: ? Redness, swelling, or pain. ? Fluid or blood. ? Warmth. ? Pus or a bad smell. Managing pain, stiffness, and swelling  For 24 hours after the procedure: ? Avoid using heat on the injection site. ? Do not take baths, swim, or use a hot tub until your health care provider approves. Ask your health care provider if you may take showers. You may only be allowed to take sponge baths.   If directed, put ice on the injection site. To do this: ? Put ice in a plastic bag. ? Place a towel between your skin and the bag. ? Leave the ice on for 20 minutes, 2-3 times a day.   Activity  If you were given a sedative during the procedure, it can affect you for several hours. Do not drive or operate machinery until your health care provider says that it is safe.  Return to your normal activities as told by your health care provider. Ask your health care provider what activities are safe for you. General instructions  Take over-the-counter and prescription medicines only as told by your health care provider.  Drink enough fluid to keep your urine pale yellow.  Keep all follow-up visits as told by your health care provider. This is important. Contact a health care provider if:  You have any of these signs of infection: ? Redness, swelling, or pain around your injection site. ? Fluid or blood coming from your injection site. ? Warmth coming from your injection site. ? Pus or a bad smell coming from your injection site. ? A fever.  You continue to have pain and soreness around the injection site, even after taking over-the-counter pain  medicine.  You have severe, sudden, or lasting nausea or vomiting. Get help right away if:  You have severe pain at the injection site that is not relieved by medicines.  You develop a severe headache or a stiff neck.  You become sensitive to light.  You have any new numbness or weakness in your legs or arms.  You lose control of your bladder or bowel movements.  You have trouble breathing. Summary  An epidural steroid injection is a shot of steroid medicine and numbing medicine that is given into the epidural space.  The shot helps relieve pain caused by an irritated or swollen nerve root.  You are more likely to benefit from this injection if your pain is strong and comes on suddenly rather than if you have had chronic pain. This information is not intended to replace advice given to you by your health care provider. Make sure you discuss any questions you have with your health care provider. Document Revised: 06/21/2019 Document Reviewed: 09/03/2018 [employer] Patient Education  [place].

## 2020-05-19 NOTE — Progress Notes (Signed)
Nursing Pain Medication Assessment:  Safety precautions to be maintained throughout the outpatient stay will include: orient to surroundings, keep bed in low position, maintain call bell within reach at all times, provide assistance with transfer out of bed and ambulation.  Medication Inspection Compliance: Pill count conducted under aseptic conditions, in front of the patient. Neither the pills nor the bottle was removed from the patient's sight at any time. Once count was completed pills were immediately returned to the patient in their original bottle.  Medication: hydrocodone Pill/Patch Count: 45 of 90 pills remain Pill/Patch Appearance: Markings consistent with prescribed medication Bottle Appearance: Standard pharmacy container. Clearly labeled. Filled Date: 2 / 25 / 2022 Last Medication intake:  Today

## 2020-05-28 LAB — TOXASSURE SELECT 13 (MW), URINE

## 2020-08-18 ENCOUNTER — Encounter: Payer: Self-pay | Admitting: Student in an Organized Health Care Education/Training Program

## 2020-08-18 ENCOUNTER — Other Ambulatory Visit: Payer: Self-pay

## 2020-08-18 ENCOUNTER — Ambulatory Visit
Payer: Medicare Other | Attending: Student in an Organized Health Care Education/Training Program | Admitting: Student in an Organized Health Care Education/Training Program

## 2020-08-18 VITALS — BP 155/76 | HR 56 | Temp 96.8°F | Resp 18 | Ht 70.0 in | Wt 240.0 lb

## 2020-08-18 DIAGNOSIS — M48062 Spinal stenosis, lumbar region with neurogenic claudication: Secondary | ICD-10-CM | POA: Diagnosis present

## 2020-08-18 DIAGNOSIS — G8929 Other chronic pain: Secondary | ICD-10-CM | POA: Insufficient documentation

## 2020-08-18 DIAGNOSIS — M5416 Radiculopathy, lumbar region: Secondary | ICD-10-CM | POA: Insufficient documentation

## 2020-08-18 DIAGNOSIS — Z981 Arthrodesis status: Secondary | ICD-10-CM | POA: Diagnosis present

## 2020-08-18 DIAGNOSIS — G894 Chronic pain syndrome: Secondary | ICD-10-CM | POA: Diagnosis present

## 2020-08-18 DIAGNOSIS — M25552 Pain in left hip: Secondary | ICD-10-CM | POA: Diagnosis present

## 2020-08-18 MED ORDER — HYDROCODONE-ACETAMINOPHEN 5-325 MG PO TABS: 1.0000 | ORAL_TABLET | Freq: Three times a day (TID) | ORAL | 0 refills | Status: DC | PRN

## 2020-08-18 MED ORDER — GABAPENTIN 100 MG PO CAPS: 100.0000 mg | ORAL_CAPSULE | Freq: Every day | ORAL | 2 refills | Status: DC

## 2020-08-18 NOTE — Progress Notes (Signed)
PROVIDER NOTE: Information contained herein reflects review and annotations entered in association with encounter. Interpretation of such information and data should be left to medically-trained personnel. Information provided to patient can be located elsewhere in the medical record under "Patient Instructions". Document created using STT-dictation technology, any transcriptional errors that may result from process are unintentional.    Patient: Michael Cobb  Service Category: E/M  Provider: Gillis Santa, MD  DOB: 1934/07/28  DOS: 08/18/2020  Specialty: Interventional Pain Management  MRN: [id number]  Setting: Ambulatory outpatient  PCP: Leonie Douglas, MD  Type: Established Patient    Referring Provider: Leonie Douglas, MD  Location: Office  Delivery: Face-to-face     HPI  Michael Cobb, a 86 y.o. year old male, is here today because of his Chronic pain syndrome [G89.4]. Mr. Misner primary complain today is Hip Pain (left) Last encounter: My last encounter with him was on 05/19/20 Pertinent problems: Mr. Tomasik has DDD (degenerative disc disease), cervical; CKD (chronic kidney disease), stage III ([place]); Chronic left shoulder pain; Lumbar radiculopathy; Lumbar facet arthropathy; Spinal stenosis, lumbar region, with neurogenic claudication; History of lumbar fusion; Chronic radicular lumbar pain; Cervical facet joint syndrome; S/P cervical spinal fusion; Pain management contract signed; and Primary osteoarthritis of left shoulder on their pertinent problem list. Pain Assessment: Severity of Chronic pain is reported as a 0-No pain/10. Location: Hip Left/ . Onset: More than a month ago. Quality: Aching. Timing: Constant. Modifying factor(s): medication. Vitals:  height is '5\' 10"'  (1.778 m) and weight is 240 lb (108.9 kg). His temperature is 96.8 F (36 C) (abnormal). His blood pressure is 155/76 (abnormal) and his pulse is 56 (abnormal). His respiration is 18 and oxygen saturation is 99%.    Reason for encounter: medication management.    No change in medical history since last visit.  Patient's pain is at baseline.  Patient continues multimodal pain regimen as prescribed.  States that it provides pain relief and improvement in functional status. Patient denies any constipation or history of falls.  Pharmacotherapy Assessment   Analgesic:  hydrocodone 5 mg 3 times daily as needed, quantity 90/month; MME equals 15    Monitoring: [place] PMP: PDMP reviewed during this encounter.       Pharmacotherapy: No side-effects or adverse reactions reported. Compliance: No problems identified. Effectiveness: Clinically acceptable.  Dewayne Shorter, RN  08/18/2020 10:22 AM  Sign when Signing Visit Nursing Pain Medication Assessment:  Safety precautions to be maintained throughout the outpatient stay will include: orient to surroundings, keep bed in low position, maintain call bell within reach at all times, provide assistance with transfer out of bed and ambulation.  Medication Inspection Compliance: Pill count conducted under aseptic conditions, in front of the patient. Neither the pills nor the bottle was removed from the patient's sight at any time. Once count was completed pills were immediately returned to the patient in their original bottle.  Medication: Hydrocodone/APAP Pill/Patch Count:  32 of 90 pills remain Pill/Patch Appearance: Markings consistent with prescribed medication Bottle Appearance: Standard pharmacy container. Clearly labeled. Filled Date: 05 / 28 / 2022 Last Medication intake:  Today      UDS:  Summary  Date Value Ref Range Status  05/19/2020 Note  Final    Comment:    ==================================================================== [facility] (MW) ==================================================================== Test                             Result  Flag       Units  Drug Present and Declared for Prescription Verification    Hydrocodone                    487          EXPECTED   ng/mg creat   Hydromorphone                  255          EXPECTED   ng/mg creat   Dihydrocodeine                 136          EXPECTED   ng/mg creat   Norhydrocodone                 519          EXPECTED   ng/mg creat    Sources of hydrocodone include scheduled prescription medications.    Hydromorphone, dihydrocodeine and norhydrocodone are expected    metabolites of hydrocodone. Hydromorphone and dihydrocodeine are    also available as scheduled prescription medications.  ==================================================================== Test                      Result    Flag   Units      Ref Range   Creatinine              69               mg/dL      >=20 ==================================================================== Declared Medications:  The flagging and interpretation on this report are based on the  following declared medications.  Unexpected results may arise from  inaccuracies in the declared medications.   **Note: The testing scope of this panel includes these medications:   Hydrocodone (Norco)   **Note: The testing scope of this panel does not include the  following reported medications:   Acetaminophen (Tylenol)  Acetaminophen (Norco)  Allopurinol (Zyloprim)  Amlodipine (Norvasc)  Aspirin  Cetirizine (Zyrtec)  Cyanocobalamin  Docusate (Colace)  Empagliflozin (Jardiance)  Escitalopram (Lexapro)  Evolocumab (Repatha)  Fluticasone (Flonase)  Furosemide (Lasix)  Gabapentin (Neurontin)  Glipizide (Glucotrol)  Iron  Isosorbide (Imdur)  Meclizine (Antivert)  Potassium (Klor-Con)  Ramipril (Altace)  Tamsulosin (Flomax)  Vitamin D2 ==================================================================== For clinical consultation, please call [phone]. ====================================================================      ROS  Constitutional: Denies any fever or chills Gastrointestinal: No  reported hemesis, hematochezia, vomiting, or acute GI distress Musculoskeletal:  Shoulder pain, low back pain Neurological: No reported episodes of acute onset apraxia, aphasia, dysarthria, agnosia, amnesia, paralysis, loss of coordination, or loss of consciousness  Medication Review  HYDROcodone-acetaminophen, Omega-3 Fatty Acids, acetaminophen, allopurinol, amLODipine, aspirin EC, cetirizine, cyanocobalamin, docusate sodium, ergocalciferol, escitalopram, ferrous sulfate, fluticasone, furosemide, gabapentin, glipiZIDE, isosorbide mononitrate, meclizine, potassium chloride SA, ramipril, and tamsulosin  History Review  Allergy: Mr. Hornaday is allergic to metformin and morphine and related. Drug: Mr. Boudoin  reports no history of drug use. Alcohol:  reports no history of alcohol use. Tobacco:  reports that he has never smoked. He has never used smokeless tobacco. Social: Mr. Belsky  reports that he has never smoked. He has never used smokeless tobacco. He reports that he does not drink alcohol and does not use drugs. Medical:  has a past medical history of BPH (benign prostatic hyperplasia), CAD (coronary artery disease), Chronic renal insufficiency, stage 3 (moderate) ([place]), Essential hypertension, History of skin cancer, Ischemic  cardiomyopathy, Mixed hyperlipidemia, Non-ST elevated myocardial infarction (non-STEMI) ([place]), Statin intolerance, and Type 2 diabetes mellitus ([place]). Surgical: Mr. Ress  has a past surgical history that includes Appendectomy; Back surgery; Surgery of lip; and LEFT HEART CATH AND CORONARY ANGIOGRAPHY (N/A, 06/27/2017). Family: family history includes CVA in his father; Hypertension in his father.  Laboratory Chemistry Profile   Renal Lab Results  Component Value Date   BUN 31 (H) 04/27/2019   CREATININE 1.66 (H) 04/27/2019   GFRAA 44 (L) 04/27/2019   GFRNONAA 38 (L) 04/27/2019     Hepatic Lab Results  Component Value Date   AST 20 12/29/2018   ALT 18  12/29/2018   ALBUMIN 3.0 (L) 12/29/2018   ALKPHOS 69 12/29/2018     Electrolytes Lab Results  Component Value Date   NA 137 04/27/2019   K 6.3 (HH) 04/27/2019   CL 111 04/27/2019   CALCIUM 8.9 04/27/2019   MG 1.9 12/31/2018     Bone No results found for: VD25OH, WY616OH7GBM, SX1155MC8, YE2336PQ2, 25OHVITD1, 25OHVITD2, 25OHVITD3, TESTOFREE, TESTOSTERONE   Inflammation (CRP: Acute Phase) (ESR: Chronic Phase) Lab Results  Component Value Date   CRP 2.4 (H) 11/20/2018   ESRSEDRATE 72 (H) 11/20/2018   LATICACIDVEN 1.7 09/09/2017       Note: Above Lab results reviewed.  Recent Imaging Review  ECHOCARDIOGRAM COMPLETE    ECHOCARDIOGRAM REPORT       Patient Name:   MONTREAL STEIDLE Date of Exam: 04/09/2020 Medical Rec #:  [id number]        Height:       70.0 in Accession #:    [id number]       Weight:       243.0 lb Date of Birth:  Jun 27, 1934         BSA:          2.267 m Patient Age:    41 years         BP:           122/62 mmHg Patient Gender: M                HR:           75 bpm. Exam Location:  [facility]  Procedure: 2D Echo, Cardiac Doppler and Color Doppler  Indications:     I25.5 Ischemic cardiomyopathy   History:         Patient has prior history of Echocardiogram examinations, most                  recent 12/29/2018. Cardiomyopathy and CHF, CAD, CKD; Risk                  Factors:Hypertension, Diabetes, Dyslipidemia and Non-Smoker.   Sonographer:     Jeneen [place] RDMS, RVT, RDCS Referring Phys:  [place] Diagnosing Phys: Carlyle Dolly MD  IMPRESSIONS   1. Left ventricular ejection fraction, by estimation, is 40 to 45%. The left ventricle has mildly decreased function. The left ventricle demonstrates global hypokinesis. Left ventricular diastolic parameters are consistent with Grade I diastolic  dysfunction (impaired relaxation).  2. Right ventricular systolic function is normal. The right ventricular size is normal.  3. Left atrial size was mildly  dilated.  4. The mitral valve is normal in structure. No evidence of mitral valve regurgitation. No evidence of mitral stenosis.  5. The aortic valve is tricuspid. There is mild calcification of the aortic valve. There is mild thickening of the aortic valve. Aortic valve  regurgitation is not visualized. No aortic stenosis is present.  Comparison(s): Echocardiogram done 12/29/18 showed an EF of 40-45%.  FINDINGS  Left Ventricle: Left ventricular ejection fraction, by estimation, is 40 to 45%. The left ventricle has mildly decreased function. The left ventricle demonstrates global hypokinesis. The left ventricular internal cavity size was normal in size. There is  no left ventricular hypertrophy. Left ventricular diastolic parameters are consistent with Grade I diastolic dysfunction (impaired relaxation). Normal left ventricular filling pressure.  Right Ventricle: The right ventricular size is normal. No increase in right ventricular wall thickness. Right ventricular systolic function is normal.  Left Atrium: Left atrial size was mildly dilated.  Right Atrium: Right atrial size was normal in size.  Pericardium: The pericardium was not well visualized.  Mitral Valve: The mitral valve is normal in structure. No evidence of mitral valve regurgitation. No evidence of mitral valve stenosis.  Tricuspid Valve: The tricuspid valve is normal in structure. Tricuspid valve regurgitation is not demonstrated. No evidence of tricuspid stenosis.  Aortic Valve: The aortic valve is tricuspid. There is mild calcification of the aortic valve. There is mild thickening of the aortic valve. There is mild aortic valve annular calcification. Aortic valve regurgitation is not visualized. No aortic stenosis  is present. Aortic valve mean gradient measures 5.3 mmHg. Aortic valve peak gradient measures 10.4 mmHg. Aortic valve area, by VTI measures 1.66 cm.  Pulmonic Valve: The pulmonic valve was not well visualized.  Pulmonic valve regurgitation is not visualized. No evidence of pulmonic stenosis.  Aorta: The aortic root is normal in size and structure.  IAS/Shunts: The interatrial septum was not well visualized.    LEFT VENTRICLE PLAX 2D LVIDd:         5.40 cm      Diastology LVIDs:         4.17 cm      LV e' medial:    4.53 cm/s LV PW:         1.00 cm      LV E/e' medial:  14.9 LV IVS:        0.98 cm      LV e' lateral:   9.41 cm/s LVOT diam:     1.80 cm      LV E/e' lateral: 7.2 LV SV:         58 LV SV Index:   26 LVOT Area:     2.54 cm   LV Volumes (MOD) LV vol d, MOD A2C: 105.5 ml LV vol d, MOD A4C: 99.5 ml LV vol s, MOD A2C: 59.2 ml LV vol s, MOD A4C: 58.9 ml LV SV MOD A2C:     46.3 ml LV SV MOD A4C:     99.5 ml LV SV MOD BP:      43.9 ml  RIGHT VENTRICLE RV S prime:     11.00 cm/s TAPSE (M-mode): 1.6 cm  LEFT ATRIUM             Index       RIGHT ATRIUM           Index LA diam:        3.70 cm 1.63 cm/m  RA Area:     15.10 cm LA Vol (A2C):   64.6 ml 28.49 ml/m RA Volume:   37.10 ml  16.36 ml/m LA Vol (A4C):   75.1 ml 33.12 ml/m LA Biplane Vol: 69.8 ml 30.78 ml/m  AORTIC VALVE AV Area (Vmax):    1.72 cm AV Area (  Vmean):   1.66 cm AV Area (VTI):     1.66 cm AV Vmax:           161.53 cm/s AV Vmean:          109.594 cm/s AV VTI:            0.350 m AV Peak Grad:      10.4 mmHg AV Mean Grad:      5.3 mmHg LVOT Vmax:         109.00 cm/s LVOT Vmean:        71.700 cm/s LVOT VTI:          0.229 m LVOT/AV VTI ratio: 0.65   AORTA Ao Root diam: 3.60 cm  MITRAL VALVE                TRICUSPID VALVE MV Area (PHT):              TR Peak grad:   5.2 mmHg MV Decel Time: 351 msec     TR Vmax:        114.00 cm/s MR Peak grad: 22.7 mmHg MR Vmax:      238.00 cm/s   SHUNTS MV E velocity: 67.30 cm/s   Systemic VTI:  0.23 m MV A velocity: 102.00 cm/s  Systemic Diam: 1.80 cm MV E/A ratio:  0.66  Carlyle Dolly MD Electronically signed by Carlyle Dolly MD Signature Date/Time:  04/09/2020/3:33:41 PM      Final   Note: Reviewed        Physical Exam  General appearance: Well nourished, well developed, and well hydrated. In no apparent acute distress Mental status: Alert, oriented x 3 (person, place, & time)       Respiratory: No evidence of acute respiratory distress Eyes: PERLA Vitals: BP (!) 155/76   Pulse (!) 56   Temp (!) 96.8 F (36 C)   Resp 18   Ht '5\' 10"'  (1.778 m)   Wt 240 lb (108.9 kg)   SpO2 99%   BMI 34.44 kg/m  BMI: Estimated body mass index is 34.44 kg/m as calculated from the following:   Height as of this encounter: '5\' 10"'  (1.778 m).   Weight as of this encounter: 240 lb (108.9 kg). Ideal: Ideal body weight: 73 kg (160 lb 15 oz) Adjusted ideal body weight: 87.3 kg (192 lb 9 oz)    Lumbar Exam  Skin & Axial Inspection: Well healed scar from previous spine surgery detected Alignment: Symmetrical Functional ROM: Pain restricted ROM affecting both sides Stability: No instability detected Muscle Tone/Strength: Functionally intact. No obvious neuro-muscular anomalies detected. Sensory (Neurological): Dermatomal pain pattern Palpation: No palpable anomalies       Provocative Tests: Hyperextension/rotation test: deferred today       Lumbar quadrant test (Kemp's test): (+) bilateral for foraminal stenosis Lateral bending test: (+) ipsilateral radicular pain, bilaterally. Positive for bilateral foraminal stenosis  Gait & Posture Assessment  Ambulation: Patient ambulates using a cane Gait: Antalgic Posture: Difficulty standing up straight, due to pain    Lower Extremity Exam      Side: Right lower extremity   Side: Left lower extremity  Stability: No instability observed           Stability: No instability observed          Skin & Extremity Inspection: Skin color, temperature, and hair growth are WNL. No peripheral edema or cyanosis. No masses, redness, swelling, asymmetry, or associated skin lesions. No contractures.   Skin & Extremity  Inspection: Skin color, temperature, and hair growth are WNL. No peripheral edema or cyanosis. No masses, redness, swelling, asymmetry, or associated skin lesions. No contractures.  Functional ROM: Pain restricted ROM for all joints of the lower extremity           Functional ROM: Pain restricted ROM for all joints of the lower extremity          Muscle Tone/Strength: Functionally intact. No obvious neuro-muscular anomalies detected.   Muscle Tone/Strength: Functionally intact. No obvious neuro-muscular anomalies detected.  Sensory (Neurological): Dermatomal pain pattern         Sensory (Neurological): Dermatomal pain pattern        DTR: Patellar: deferred today Achilles: deferred today Plantar: deferred today   DTR: Patellar: deferred today Achilles: deferred today Plantar: deferred today  Palpation: No palpable anomalies   Palpation: No palpable anomalies    Assessment   Status Diagnosis  Controlled Controlled Controlled 1. Chronic pain syndrome   2. Chronic radicular lumbar pain   3. Lumbar radiculopathy   4. Arthralgia of left hip   5. Left hip pain   6. Spinal stenosis, lumbar region, with neurogenic claudication   7. History of lumbar fusion       Plan of Care  Mr. KAYLUB DETIENNE has a current medication list which includes the following long-term medication(s): amlodipine, cetirizine, escitalopram, ferrous sulfate, fluticasone, furosemide, glipizide, isosorbide mononitrate, potassium chloride sa, ramipril, gabapentin, [START ON 08/31/2020] hydrocodone-acetaminophen, [START ON 09/30/2020] hydrocodone-acetaminophen, and [START ON 10/30/2020] hydrocodone-acetaminophen.  Pharmacotherapy (Medications Ordered): Meds ordered this encounter  Medications   HYDROcodone-acetaminophen (NORCO/VICODIN) 5-325 MG tablet    Sig: Take 1 tablet by mouth every 8 (eight) hours as needed for severe pain. Must last 30 days.    Dispense:  90 tablet    Refill:  0    Chronic Pain. (STOP Act - Not  applicable). Fill one day early if closed on scheduled refill date.   HYDROcodone-acetaminophen (NORCO/VICODIN) 5-325 MG tablet    Sig: Take 1 tablet by mouth every 8 (eight) hours as needed for severe pain. Must last 30 days.    Dispense:  90 tablet    Refill:  0    Chronic Pain. (STOP Act - Not applicable). Fill one day early if closed on scheduled refill date.   HYDROcodone-acetaminophen (NORCO/VICODIN) 5-325 MG tablet    Sig: Take 1 tablet by mouth every 8 (eight) hours as needed for severe pain. Must last 30 days.    Dispense:  90 tablet    Refill:  0    Chronic Pain. (STOP Act - Not applicable). Fill one day early if closed on scheduled refill date.   gabapentin (NEURONTIN) 100 MG capsule    Sig: Take 1-2 capsules (100-200 mg total) by mouth at bedtime.    Dispense:  60 capsule    Refill:  2   UDS up-to-date and appropriate.  PMP checked and appropriate.  Follow-up plan:   Return in about 3 months (around 11/18/2020) for Medication Management, in person.      Interventional management options:  Considering:   S/p L2/3 ESI 08/26/19, 01/06/20: Significant pain relief, repeat as needed Status post left intra-articular hip steroid injection 02/17/2020  Recent Visits No visits were found meeting these conditions. Showing recent visits within past 90 days and meeting all other requirements Today's Visits Date Type Provider Dept  08/18/20 Office Visit Gillis Santa, MD [facility]  Showing today's visits and meeting all other requirements Future Appointments No visits  were found meeting these conditions. Showing future appointments within next 90 days and meeting all other requirements I discussed the assessment and treatment plan with the patient. The patient was provided an opportunity to ask questions and all were answered. The patient agreed with the plan and demonstrated an understanding of the instructions.  Patient advised to call back or seek an in-person  evaluation if the symptoms or condition worsens.  Duration of encounter:30 minutes.  Note by: Gillis Santa, MD Date: 08/18/2020; Time: 10:43 AM

## 2020-08-18 NOTE — Progress Notes (Signed)
Nursing Pain Medication Assessment:  Safety precautions to be maintained throughout the outpatient stay will include: orient to surroundings, keep bed in low position, maintain call bell within reach at all times, provide assistance with transfer out of bed and ambulation.  Medication Inspection Compliance: Pill count conducted under aseptic conditions, in front of the patient. Neither the pills nor the bottle was removed from the patient's sight at any time. Once count was completed pills were immediately returned to the patient in their original bottle.  Medication: Hydrocodone/APAP Pill/Patch Count:  32 of 90 pills remain Pill/Patch Appearance: Markings consistent with prescribed medication Bottle Appearance: Standard pharmacy container. Clearly labeled. Filled Date: 05 / 28 / 2022 Last Medication intake:  Today

## 2020-10-25 NOTE — Progress Notes (Signed)
Cardiology Office Note  Date: 10/26/2020   ID: Michael, Cobb [birth year], MRN [id number]  PCP:  Michael Douglas, MD  Cardiologist:  Rozann Lesches, MD Electrophysiologist:  None   Chief Complaint  Patient presents with   Cardiac follow-up    History of Present Illness: Michael Cobb is an 86 y.o. male last seen in January.  He is here for a follow-up visit.  Overall, stable in terms of angina symptoms on current therapy.  He has used nitroglycerin on a few occasions.  Cardiac catheterization in April 2019 revealed severe multivessel CAD, heavily calcified vessels and poor targets for revascularization.  Medical therapy was recommended by Dr. Angelena Form.  Follow-up echocardiogram in February revealed LVEF 40 to 45% range with global hypokinesis and mild diastolic dysfunction.  We went over his current regimen.  I did talk with him about considering a switch from Altace to [facility], for now he was comfortable with his current medications.  He did have to stop Jardiance temporarily due to increased cost, but this has settled back down and he has resumed the medication.  Otherwise remains on aspirin, Norvasc, Lasix, and potassium supplement.  He is not on beta-blocker due to symptomatic bradycardia.  Past Medical History:  Diagnosis Date   BPH (benign prostatic hyperplasia)    CAD (coronary artery disease)    Multivessel CAD, heavily calcified   Chronic renal insufficiency, stage 3 (moderate) (HCC)    Essential hypertension    History of skin cancer    Ischemic cardiomyopathy    Mixed hyperlipidemia    Non-ST elevated myocardial infarction (non-STEMI) (HCC)    Statin intolerance    Type 2 diabetes mellitus ([place])     Past Surgical History:  Procedure Laterality Date   APPENDECTOMY     BACK SURGERY     LEFT HEART CATH AND CORONARY ANGIOGRAPHY N/A 06/27/2017   Procedure: LEFT HEART CATH AND CORONARY ANGIOGRAPHY;  Surgeon: Burnell Blanks, MD;  Location: [facility];  Service: Cardiovascular;  Laterality: N/A;   SURGERY OF LIP      Current Outpatient Medications  Medication Sig Dispense Refill   acetaminophen (TYLENOL) 500 MG tablet Take 500 mg by mouth every 6 (six) hours as needed for mild pain or moderate pain.     allopurinol (ZYLOPRIM) 300 MG tablet Take 300 mg by mouth daily.     amLODipine (NORVASC) 10 MG tablet Take 1 tablet (10 mg total) by mouth daily. 90 tablet 3   aspirin EC 81 MG tablet Take 81 mg by mouth at bedtime.      cetirizine (ZYRTEC) 10 MG tablet Take 10 mg by mouth at bedtime.      docusate sodium (COLACE) 100 MG capsule Take 100 mg by mouth 2 (two) times daily.     escitalopram (LEXAPRO) 10 MG tablet Take by mouth.     ferrous sulfate 325 (65 FE) MG tablet Take 1 tablet (325 mg total) by mouth 2 (two) times daily with a meal. 30 tablet 3   fluticasone (FLONASE) 50 MCG/ACT nasal spray Place 1 spray into both nostrils daily as needed for allergies or rhinitis.      furosemide (LASIX) 40 MG tablet Take 1 tablet (40 mg total) by mouth daily. 30 tablet 2   gabapentin (NEURONTIN) 100 MG capsule Take 1-2 capsules (100-200 mg total) by mouth at bedtime. 60 capsule 2   glipiZIDE (GLUCOTROL) 10 MG tablet Take 1 tablet by mouth 2 (two) times daily.  HYDROcodone-acetaminophen (NORCO/VICODIN) 5-325 MG tablet Take 1 tablet by mouth every 8 (eight) hours as needed for severe pain. Must last 30 days. 90 tablet 0   [START ON 10/30/2020] HYDROcodone-acetaminophen (NORCO/VICODIN) 5-325 MG tablet Take 1 tablet by mouth every 8 (eight) hours as needed for severe pain. Must last 30 days. 90 tablet 0   isosorbide mononitrate (IMDUR) 30 MG 24 hr tablet TAKE ONE TABLET BY MOUTH DAILY 90 tablet 2   JARDIANCE 25 MG TABS tablet Take 25 mg by mouth daily.     meclizine (ANTIVERT) 25 MG tablet Take 25 mg by mouth 3 (three) times daily as needed for dizziness.     Omega-3 Fatty Acids (FISH OIL PO) Take 1 tablet by mouth 2 (two) times daily.       potassium chloride SA (KLOR-CON) 20 MEQ tablet Take 1 tablet (20 mEq total) by mouth daily. 90 tablet 1   ramipril (ALTACE) 10 MG capsule Take 10 mg by mouth 2 (two) times daily.  0   tamsulosin (FLOMAX) 0.4 MG CAPS capsule Take 0.4 mg by mouth at bedtime.      vitamin B-12 1000 MCG tablet Take 1 tablet (1,000 mcg total) by mouth daily. 30 tablet 1   HYDROcodone-acetaminophen (NORCO/VICODIN) 5-325 MG tablet Take 1 tablet by mouth every 8 (eight) hours as needed for severe pain. Must last 30 days. 90 tablet 0   No current facility-administered medications for this visit.   Allergies:  Metformin and Morphine and related   ROS: Hearing loss, uses a cane to ambulate, no recent falls.  Physical Exam: VS:  BP (!) 150/52   Pulse 62   Ht '5\' 10"'$  (1.778 m)   Wt 240 lb 12.8 oz (109.2 kg)   SpO2 97%   BMI 34.55 kg/m , BMI Body mass index is 34.55 kg/m.  Wt Readings from Last 3 Encounters:  10/26/20 240 lb 12.8 oz (109.2 kg)  08/18/20 240 lb (108.9 kg)  05/19/20 243 lb (110.2 kg)    General: Elderly male, appears comfortable at rest.  Using a cane. HEENT: Conjunctiva and lids normal, wearing a mask. Neck: Supple, no elevated JVP or carotid bruits, no thyromegaly. Lungs: Clear to auscultation, nonlabored breathing at rest. Cardiac: Regular rate and rhythm, no S3, 1/6 systolic murmur. Extremities: No pitting edema.  ECG:  An ECG dated 03/16/2020 was personally reviewed today and demonstrated:  Sinus rhythm with old anterior/inferior infarct pattern.  Recent Labwork: No results found for requested labs within last 8760 hours.     Component Value Date/Time   CHOL 305 (H) 03/25/2019 0904   TRIG 205 (H) 03/25/2019 0904   HDL 37 (L) 03/25/2019 0904   CHOLHDL 8.2 03/25/2019 0904   VLDL 41 (H) 03/25/2019 0904   LDLCALC 227 (H) 03/25/2019 0904  June 2021: Hemoglobin 11.5, platelets 213, cholesterol 152, triglycerides 193, HDL 37, LDL 82, hemoglobin A1c 9%  Other Studies Reviewed  Today:  Echocardiogram 04/09/2020:  1. Left ventricular ejection fraction, by estimation, is 40 to 45%. The  left ventricle has mildly decreased function. The left ventricle  demonstrates global hypokinesis. Left ventricular diastolic parameters are  consistent with Grade I diastolic  dysfunction (impaired relaxation).   2. Right ventricular systolic function is normal. The right ventricular  size is normal.   3. Left atrial size was mildly dilated.   4. The mitral valve is normal in structure. No evidence of mitral valve  regurgitation. No evidence of mitral stenosis.   5. The aortic valve is  tricuspid. There is mild calcification of the  aortic valve. There is mild thickening of the aortic valve. Aortic valve  regurgitation is not visualized. No aortic stenosis is present.   Assessment and Plan:  1.  Multivessel CAD with limited revascularization options based on heavy calcification and plan for medical therapy.  He is doing reasonably well on current regimen.  Continue aspirin, Imdur, Altace, Norvasc, and as needed nitroglycerin.  2.  Ischemic cardiomyopathy, LVEF stable in the range of 40 to 45%.  I did talk with him about considering a switch from Altace to [facility], but he preferred staying the current course.  He is also on Jardiance and Lasix with potassium supplement.  No beta-blocker with history of symptomatic bradycardia.  3.  Mixed hyperlipidemia, I have recommended that he continue Repatha given history of statin intolerance.  Medication Adjustments/Labs and Tests Ordered: Current medicines are reviewed at length with the patient today.  Concerns regarding medicines are outlined above.   Tests Ordered: No orders of the defined types were placed in this encounter.   Medication Changes: No orders of the defined types were placed in this encounter.   Disposition:  Follow up  6 months.  Signed, Satira Sark, MD, [facility] 10/26/2020 11:40 AM    [place] [place] [facility] [place], [place], [place] [address] Phone: [phone]; Fax: [phone]

## 2020-10-26 ENCOUNTER — Encounter: Payer: Self-pay | Admitting: Cardiology

## 2020-10-26 ENCOUNTER — Ambulatory Visit: Payer: Medicare Other | Admitting: Cardiology

## 2020-10-26 VITALS — BP 150/52 | HR 62 | Ht 70.0 in | Wt 240.8 lb

## 2020-10-26 DIAGNOSIS — Z789 Other specified health status: Secondary | ICD-10-CM

## 2020-10-26 DIAGNOSIS — I255 Ischemic cardiomyopathy: Secondary | ICD-10-CM

## 2020-10-26 DIAGNOSIS — I25119 Atherosclerotic heart disease of native coronary artery with unspecified angina pectoris: Secondary | ICD-10-CM | POA: Diagnosis not present

## 2020-10-26 DIAGNOSIS — E782 Mixed hyperlipidemia: Secondary | ICD-10-CM | POA: Diagnosis not present

## 2020-10-26 NOTE — Patient Instructions (Signed)

## 2020-10-27 ENCOUNTER — Other Ambulatory Visit: Payer: Self-pay | Admitting: Student in an Organized Health Care Education/Training Program

## 2020-10-27 DIAGNOSIS — M5416 Radiculopathy, lumbar region: Secondary | ICD-10-CM

## 2020-10-27 DIAGNOSIS — G894 Chronic pain syndrome: Secondary | ICD-10-CM

## 2020-10-27 DIAGNOSIS — G8929 Other chronic pain: Secondary | ICD-10-CM

## 2020-11-17 ENCOUNTER — Encounter: Payer: Self-pay | Admitting: Student in an Organized Health Care Education/Training Program

## 2020-11-17 ENCOUNTER — Ambulatory Visit
Payer: Medicare Other | Attending: Student in an Organized Health Care Education/Training Program | Admitting: Student in an Organized Health Care Education/Training Program

## 2020-11-17 ENCOUNTER — Other Ambulatory Visit: Payer: Self-pay

## 2020-11-17 VITALS — BP 152/76 | HR 56 | Temp 97.0°F | Resp 16 | Ht 70.0 in | Wt 240.0 lb

## 2020-11-17 DIAGNOSIS — M25552 Pain in left hip: Secondary | ICD-10-CM | POA: Diagnosis present

## 2020-11-17 DIAGNOSIS — G894 Chronic pain syndrome: Secondary | ICD-10-CM | POA: Diagnosis present

## 2020-11-17 DIAGNOSIS — M48062 Spinal stenosis, lumbar region with neurogenic claudication: Secondary | ICD-10-CM | POA: Diagnosis present

## 2020-11-17 DIAGNOSIS — G8929 Other chronic pain: Secondary | ICD-10-CM | POA: Insufficient documentation

## 2020-11-17 DIAGNOSIS — M5416 Radiculopathy, lumbar region: Secondary | ICD-10-CM | POA: Diagnosis present

## 2020-11-17 MED ORDER — GABAPENTIN 100 MG PO CAPS: 100.0000 mg | ORAL_CAPSULE | Freq: Every day | ORAL | 2 refills | Status: DC

## 2020-11-17 MED ORDER — HYDROCODONE-ACETAMINOPHEN 5-325 MG PO TABS: 1.0000 | ORAL_TABLET | Freq: Three times a day (TID) | ORAL | 0 refills | Status: DC | PRN

## 2020-11-17 NOTE — Progress Notes (Signed)
PROVIDER NOTE: Information contained herein reflects review and annotations entered in association with encounter. Interpretation of such information and data should be left to medically-trained personnel. Information provided to patient can be located elsewhere in the medical record under "Patient Instructions". Document created using STT-dictation technology, any transcriptional errors that may result from process are unintentional.    Patient: Michael Cobb  Service Category: E/M  Provider: Gillis Santa, MD  DOB: 1934/07/17  DOS: 11/17/2020  Specialty: Interventional Pain Management  MRN: [id number]  Setting: Ambulatory outpatient  PCP: Michael Douglas, MD  Type: Established Patient    Referring Provider: Leonie Douglas, MD  Location: Office  Delivery: Face-to-face     HPI  [name], a 86 y.o. year old male, is here today because of his Chronic radicular lumbar pain [M54.16, G89.29]. [name] primary complain today is Shoulder Pain (left) and Hip Pain (left) Last encounter: My last encounter with him was on 08/18/20 Pertinent problems: Michael Cobb has DDD (degenerative disc disease), cervical; CKD (chronic kidney disease), stage III ([place]); Chronic left shoulder pain; Lumbar radiculopathy; Lumbar facet arthropathy; Spinal stenosis, lumbar region, with neurogenic claudication; History of lumbar fusion; Chronic radicular lumbar pain; Cervical facet joint syndrome; S/P cervical spinal fusion; Pain management contract signed; and Primary osteoarthritis of left shoulder on their pertinent problem list. Pain Assessment: Severity of Chronic pain is reported as a 5 /10. Location: Shoulder Left/shoulder pain radiates down left arm. Onset: More than a month ago. Quality: Sharp. Timing: Intermittent. Modifying factor(s): medications. Vitals:  height is '5\' 10"'  (1.778 m) and weight is 240 lb (108.9 kg). His temporal temperature is 97 F (36.1 C) (abnormal). His blood pressure is 152/76  (abnormal) and his pulse is 56 (abnormal). His respiration is 16 and oxygen saturation is 99%.   Reason for encounter: medication management.    No change in medical history since last visit.  Patient's pain is at baseline.  Patient continues multimodal pain regimen as prescribed.  States that it provides pain relief and improvement in functional status.  Of note patient has been without his medications since August 27.  He was unaware that he still had a prescription at his pharmacy for hydrocodone that he could fill.  I reminded him to either contact us or his pharmacy if he is ever without his medications as the patient states the last 2 weeks have been difficult in terms of pain management. Patient denies any constipation or history of falls.     Pharmacotherapy Assessment    Analgesic:  hydrocodone 5 mg 3 times daily as needed, quantity 90/month; MME equals 15    Monitoring: [place] PMP: PDMP reviewed during this encounter.       Pharmacotherapy: No side-effects or adverse reactions reported. Compliance: No problems identified. Effectiveness: Clinically acceptable.  Landis Martins, RN  11/17/2020 10:40 AM  Sign when Signing Visit Nursing Pain Medication Assessment:  Safety precautions to be maintained throughout the outpatient stay will include: orient to surroundings, keep bed in low position, maintain call bell within reach at all times, provide assistance with transfer out of bed and ambulation.  Medication Inspection Compliance: Pill count conducted under aseptic conditions, in front of the patient. Neither the pills nor the bottle was removed from the patient's sight at any time. Once count was completed pills were immediately returned to the patient in their original bottle.  Medication: Hydrocodone/APAP Pill/Patch Count:  0 of 90 pills remain Pill/Patch Appearance: Markings consistent with prescribed medication Bottle Appearance: Standard  pharmacy container. Clearly labeled. Filled  Date: 07 / 27 / 2022 Last Medication intake:  Today      UDS:  Summary  Date Value Ref Range Status  05/19/2020 Note  Final    Comment:    ==================================================================== [facility] (MW) ==================================================================== Test                             Result       Flag       Units  Drug Present and Declared for Prescription Verification   Hydrocodone                    487          EXPECTED   ng/mg creat   Hydromorphone                  255          EXPECTED   ng/mg creat   Dihydrocodeine                 136          EXPECTED   ng/mg creat   Norhydrocodone                 519          EXPECTED   ng/mg creat    Sources of hydrocodone include scheduled prescription medications.    Hydromorphone, dihydrocodeine and norhydrocodone are expected    metabolites of hydrocodone. Hydromorphone and dihydrocodeine are    also available as scheduled prescription medications.  ==================================================================== Test                      Result    Flag   Units      Ref Range   Creatinine              69               mg/dL      >=20 ==================================================================== Declared Medications:  The flagging and interpretation on this report are based on the  following declared medications.  Unexpected results may arise from  inaccuracies in the declared medications.   **Note: The testing scope of this panel includes these medications:   Hydrocodone (Norco)   **Note: The testing scope of this panel does not include the  following reported medications:   Acetaminophen (Tylenol)  Acetaminophen (Norco)  Allopurinol (Zyloprim)  Amlodipine (Norvasc)  Aspirin  Cetirizine (Zyrtec)  Cyanocobalamin  Docusate (Colace)  Empagliflozin (Jardiance)  Escitalopram (Lexapro)  Evolocumab (Repatha)  Fluticasone (Flonase)  Furosemide (Lasix)  Gabapentin  (Neurontin)  Glipizide (Glucotrol)  Iron  Isosorbide (Imdur)  Meclizine (Antivert)  Potassium (Klor-Con)  Ramipril (Altace)  Tamsulosin (Flomax)  Vitamin D2 ==================================================================== For clinical consultation, please call [phone]. ====================================================================      ROS  Constitutional: Denies any fever or chills Gastrointestinal: No reported hemesis, hematochezia, vomiting, or acute GI distress Musculoskeletal:  Shoulder pain, low back pain Neurological: No reported episodes of acute onset apraxia, aphasia, dysarthria, agnosia, amnesia, paralysis, loss of coordination, or loss of consciousness  Medication Review  HYDROcodone-acetaminophen, Omega-3 Fatty Acids, allopurinol, amLODipine, aspirin EC, cetirizine, cyanocobalamin, docusate sodium, escitalopram, ferrous sulfate, fluticasone, furosemide, gabapentin, glipiZIDE, insulin glargine, isosorbide mononitrate, meclizine, nitroGLYCERIN, potassium chloride SA, ramipril, and tamsulosin  History Review  Allergy: Michael Cobb is allergic to metformin and morphine and related. Drug: Michael Cobb  reports no  history of drug use. Alcohol:  reports no history of alcohol use. Tobacco:  reports that he has never smoked. He has never used smokeless tobacco. Social: Michael Cobb  reports that he has never smoked. He has never used smokeless tobacco. He reports that he does not drink alcohol and does not use drugs. Medical:  has a past medical history of BPH (benign prostatic hyperplasia), CAD (coronary artery disease), Chronic renal insufficiency, stage 3 (moderate) ([place]), Essential hypertension, History of skin cancer, Ischemic cardiomyopathy, Mixed hyperlipidemia, Non-ST elevated myocardial infarction (non-STEMI) ([place]), Statin intolerance, and Type 2 diabetes mellitus ([place]). Surgical: Michael Cobb  has a past surgical history that includes Appendectomy; Back surgery;  Surgery of lip; and LEFT HEART CATH AND CORONARY ANGIOGRAPHY (N/A, 06/27/2017). Family: family history includes CVA in his father; Hypertension in his father.  Laboratory Chemistry Profile   Renal Lab Results  Component Value Date   BUN 31 (H) 04/27/2019   CREATININE 1.66 (H) 04/27/2019   GFRAA 44 (L) 04/27/2019   GFRNONAA 38 (L) 04/27/2019     Hepatic Lab Results  Component Value Date   AST 20 12/29/2018   ALT 18 12/29/2018   ALBUMIN 3.0 (L) 12/29/2018   ALKPHOS 69 12/29/2018     Electrolytes Lab Results  Component Value Date   NA 137 04/27/2019   K 6.3 (HH) 04/27/2019   CL 111 04/27/2019   CALCIUM 8.9 04/27/2019   MG 1.9 12/31/2018     Bone No results found for: VD25OH, [id number], PJ0932IZ1, IW5809XI3, 25OHVITD1, 25OHVITD2, 25OHVITD3, TESTOFREE, TESTOSTERONE   Inflammation (CRP: Acute Phase) (ESR: Chronic Phase) Lab Results  Component Value Date   CRP 2.4 (H) 11/20/2018   ESRSEDRATE 72 (H) 11/20/2018   LATICACIDVEN 1.7 09/09/2017       Note: Above Lab results reviewed.  Recent Imaging Review  ECHOCARDIOGRAM COMPLETE    ECHOCARDIOGRAM REPORT       Patient Name:   DELVON CHIPPS Date of Exam: 04/09/2020 Medical Rec #:  [id number]        Height:       70.0 in Accession #:    [id number]       Weight:       243.0 lb Date of Birth:  11/27/1934         BSA:          2.267 m Patient Age:    89 years         BP:           122/62 mmHg Patient Gender: M                HR:           75 bpm. Exam Location:  [facility]  Procedure: 2D Echo, Cardiac Doppler and Color Doppler  Indications:     I25.5 Ischemic cardiomyopathy   History:         Patient has prior history of Echocardiogram examinations, most                  recent 12/29/2018. Cardiomyopathy and CHF, CAD, CKD; Risk                  Factors:Hypertension, Diabetes, Dyslipidemia and Non-Smoker.   Sonographer:     Jeneen Montgomery RDMS, RVT, RDCS Referring Phys:  [place] Diagnosing Phys: Carlyle Dolly MD  IMPRESSIONS   1. Left ventricular ejection fraction, by estimation, is 40 to 45%. The left ventricle has mildly decreased function. The left  ventricle demonstrates global hypokinesis. Left ventricular diastolic parameters are consistent with Grade I diastolic  dysfunction (impaired relaxation).  2. Right ventricular systolic function is normal. The right ventricular size is normal.  3. Left atrial size was mildly dilated.  4. The mitral valve is normal in structure. No evidence of mitral valve regurgitation. No evidence of mitral stenosis.  5. The aortic valve is tricuspid. There is mild calcification of the aortic valve. There is mild thickening of the aortic valve. Aortic valve regurgitation is not visualized. No aortic stenosis is present.  Comparison(s): Echocardiogram done 12/29/18 showed an EF of 40-45%.  FINDINGS  Left Ventricle: Left ventricular ejection fraction, by estimation, is 40 to 45%. The left ventricle has mildly decreased function. The left ventricle demonstrates global hypokinesis. The left ventricular internal cavity size was normal in size. There is  no left ventricular hypertrophy. Left ventricular diastolic parameters are consistent with Grade I diastolic dysfunction (impaired relaxation). Normal left ventricular filling pressure.  Right Ventricle: The right ventricular size is normal. No increase in right ventricular wall thickness. Right ventricular systolic function is normal.  Left Atrium: Left atrial size was mildly dilated.  Right Atrium: Right atrial size was normal in size.  Pericardium: The pericardium was not well visualized.  Mitral Valve: The mitral valve is normal in structure. No evidence of mitral valve regurgitation. No evidence of mitral valve stenosis.  Tricuspid Valve: The tricuspid valve is normal in structure. Tricuspid valve regurgitation is not demonstrated. No evidence of tricuspid stenosis.  Aortic Valve: The aortic valve is  tricuspid. There is mild calcification of the aortic valve. There is mild thickening of the aortic valve. There is mild aortic valve annular calcification. Aortic valve regurgitation is not visualized. No aortic stenosis  is present. Aortic valve mean gradient measures 5.3 mmHg. Aortic valve peak gradient measures 10.4 mmHg. Aortic valve area, by VTI measures 1.66 cm.  Pulmonic Valve: The pulmonic valve was not well visualized. Pulmonic valve regurgitation is not visualized. No evidence of pulmonic stenosis.  Aorta: The aortic root is normal in size and structure.  IAS/Shunts: The interatrial septum was not well visualized.    LEFT VENTRICLE PLAX 2D LVIDd:         5.40 cm      Diastology LVIDs:         4.17 cm      LV e' medial:    4.53 cm/s LV PW:         1.00 cm      LV E/e' medial:  14.9 LV IVS:        0.98 cm      LV e' lateral:   9.41 cm/s LVOT diam:     1.80 cm      LV E/e' lateral: 7.2 LV SV:         58 LV SV Index:   26 LVOT Area:     2.54 cm   LV Volumes (MOD) LV vol d, MOD A2C: 105.5 ml LV vol d, MOD A4C: 99.5 ml LV vol s, MOD A2C: 59.2 ml LV vol s, MOD A4C: 58.9 ml LV SV MOD A2C:     46.3 ml LV SV MOD A4C:     99.5 ml LV SV MOD BP:      43.9 ml  RIGHT VENTRICLE RV S prime:     11.00 cm/s TAPSE (M-mode): 1.6 cm  LEFT ATRIUM             Index  RIGHT ATRIUM           Index LA diam:        3.70 cm 1.63 cm/m  RA Area:     15.10 cm LA Vol (A2C):   64.6 ml 28.49 ml/m RA Volume:   37.10 ml  16.36 ml/m LA Vol (A4C):   75.1 ml 33.12 ml/m LA Biplane Vol: 69.8 ml 30.78 ml/m  AORTIC VALVE AV Area (Vmax):    1.72 cm AV Area (Vmean):   1.66 cm AV Area (VTI):     1.66 cm AV Vmax:           161.53 cm/s AV Vmean:          109.594 cm/s AV VTI:            0.350 m AV Peak Grad:      10.4 mmHg AV Mean Grad:      5.3 mmHg LVOT Vmax:         109.00 cm/s LVOT Vmean:        71.700 cm/s LVOT VTI:          0.229 m LVOT/AV VTI ratio: 0.65   AORTA Ao Root diam: 3.60  cm  MITRAL VALVE                TRICUSPID VALVE MV Area (PHT):              TR Peak grad:   5.2 mmHg MV Decel Time: 351 msec     TR Vmax:        114.00 cm/s MR Peak grad: 22.7 mmHg MR Vmax:      238.00 cm/s   SHUNTS MV E velocity: 67.30 cm/s   Systemic VTI:  0.23 m MV A velocity: 102.00 cm/s  Systemic Diam: 1.80 cm MV E/A ratio:  0.66  Carlyle Dolly MD Electronically signed by Carlyle Dolly MD Signature Date/Time: 04/09/2020/3:33:41 PM      Final   Note: Reviewed        Physical Exam  General appearance: Well nourished, well developed, and well hydrated. In no apparent acute distress Mental status: Alert, oriented x 3 (person, place, & time)       Respiratory: No evidence of acute respiratory distress Eyes: PERLA Vitals: BP (!) 152/76   Pulse (!) 56   Temp (!) 97 F (36.1 C) (Temporal)   Resp 16   Ht '5\' 10"'  (1.778 m)   Wt 240 lb (108.9 kg)   SpO2 99%   BMI 34.44 kg/m  BMI: Estimated body mass index is 34.44 kg/m as calculated from the following:   Height as of this encounter: '5\' 10"'  (1.778 m).   Weight as of this encounter: 240 lb (108.9 kg). Ideal: Ideal body weight: 73 kg (160 lb 15 oz) Adjusted ideal body weight: 87.3 kg (192 lb 9 oz)    Lumbar Exam  Skin & Axial Inspection: Well healed scar from previous spine surgery detected Alignment: Symmetrical Functional ROM: Pain restricted ROM affecting both sides Stability: No instability detected Muscle Tone/Strength: Functionally intact. No obvious neuro-muscular anomalies detected. Sensory (Neurological): Dermatomal pain pattern Palpation: No palpable anomalies       Provocative Tests: Hyperextension/rotation test: deferred today       Lumbar quadrant test (Kemp's test): (+) bilateral for foraminal stenosis Lateral bending test: (+) ipsilateral radicular pain, bilaterally. Positive for bilateral foraminal stenosis  Gait & Posture Assessment  Ambulation: Patient ambulates using a cane Gait: Antalgic Posture:  Difficulty standing up straight, due  to pain    Lower Extremity Exam      Side: Right lower extremity   Side: Left lower extremity  Stability: No instability observed           Stability: No instability observed          Skin & Extremity Inspection: Skin color, temperature, and hair growth are WNL. No peripheral edema or cyanosis. No masses, redness, swelling, asymmetry, or associated skin lesions. No contractures.   Skin & Extremity Inspection: Skin color, temperature, and hair growth are WNL. No peripheral edema or cyanosis. No masses, redness, swelling, asymmetry, or associated skin lesions. No contractures.  Functional ROM: Pain restricted ROM for all joints of the lower extremity           Functional ROM: Pain restricted ROM for all joints of the lower extremity          Muscle Tone/Strength: Functionally intact. No obvious neuro-muscular anomalies detected.   Muscle Tone/Strength: Functionally intact. No obvious neuro-muscular anomalies detected.  Sensory (Neurological): Dermatomal pain pattern         Sensory (Neurological): Dermatomal pain pattern        DTR: Patellar: deferred today Achilles: deferred today Plantar: deferred today   DTR: Patellar: deferred today Achilles: deferred today Plantar: deferred today  Palpation: No palpable anomalies   Palpation: No palpable anomalies    Assessment   Status Diagnosis  Controlled Controlled Controlled 1. Chronic radicular lumbar pain   2. Lumbar radiculopathy   3. Chronic pain syndrome   4. Arthralgia of left hip   5. Left hip pain   6. Spinal stenosis, lumbar region, with neurogenic claudication        Plan of Care  Michael Cobb has a current medication list which includes the following long-term medication(s): amlodipine, cetirizine, escitalopram, escitalopram, ferrous sulfate, fluticasone, furosemide, furosemide, glipizide, insulin glargine, isosorbide mononitrate, potassium chloride sa, ramipril, gabapentin, [START ON  12/17/2020] hydrocodone-acetaminophen, [START ON 01/16/2021] hydrocodone-acetaminophen, and [START ON 02/15/2021] hydrocodone-acetaminophen.  Pharmacotherapy (Medications Ordered): Meds ordered this encounter  Medications   HYDROcodone-acetaminophen (NORCO/VICODIN) 5-325 MG tablet    Sig: Take 1 tablet by mouth every 8 (eight) hours as needed for severe pain. Must last 30 days.    Dispense:  90 tablet    Refill:  0    Chronic Pain. (STOP Act - Not applicable). Fill one day early if closed on scheduled refill date.   HYDROcodone-acetaminophen (NORCO/VICODIN) 5-325 MG tablet    Sig: Take 1 tablet by mouth every 8 (eight) hours as needed for severe pain. Must last 30 days.    Dispense:  90 tablet    Refill:  0    Chronic Pain. (STOP Act - Not applicable). Fill one day early if closed on scheduled refill date.   HYDROcodone-acetaminophen (NORCO/VICODIN) 5-325 MG tablet    Sig: Take 1 tablet by mouth every 8 (eight) hours as needed for severe pain. Must last 30 days.    Dispense:  90 tablet    Refill:  0    Chronic Pain. (STOP Act - Not applicable). Fill one day early if closed on scheduled refill date.   gabapentin (NEURONTIN) 100 MG capsule    Sig: Take 1-2 capsules (100-200 mg total) by mouth at bedtime.    Dispense:  60 capsule    Refill:  2    UDS up-to-date and appropriate.  PMP checked and appropriate.  Follow-up plan:   Return in about 4 months (around 03/17/2021) for Medication Management, in  person.      Interventional management options:  Considering:   S/p L2/3 ESI 08/26/19, 01/06/20: Significant pain relief, repeat as needed Status post left intra-articular hip steroid injection 02/17/2020  Recent Visits No visits were found meeting these conditions. Showing recent visits within past 90 days and meeting all other requirements Today's Visits Date Type Provider Dept  11/17/20 Office Visit Gillis Santa, MD [facility]  Showing today's visits and meeting all  other requirements Future Appointments No visits were found meeting these conditions. Showing future appointments within next 90 days and meeting all other requirements I discussed the assessment and treatment plan with the patient. The patient was provided an opportunity to ask questions and all were answered. The patient agreed with the plan and demonstrated an understanding of the instructions.  Patient advised to call back or seek an in-person evaluation if the symptoms or condition worsens.  Duration of encounter:30 minutes.  Note by: Gillis Santa, MD Date: 11/17/2020; Time: 11:07 AM

## 2020-11-17 NOTE — Progress Notes (Signed)
Nursing Pain Medication Assessment:  Safety precautions to be maintained throughout the outpatient stay will include: orient to surroundings, keep bed in low position, maintain call bell within reach at all times, provide assistance with transfer out of bed and ambulation.  Medication Inspection Compliance: Pill count conducted under aseptic conditions, in front of the patient. Neither the pills nor the bottle was removed from the patient's sight at any time. Once count was completed pills were immediately returned to the patient in their original bottle.  Medication: Hydrocodone/APAP Pill/Patch Count:  0 of 90 pills remain Pill/Patch Appearance: Markings consistent with prescribed medication Bottle Appearance: Standard pharmacy container. Clearly labeled. Filled Date: 07 / 27 / 2022 Last Medication intake:  Today

## 2020-11-25 ENCOUNTER — Telehealth: Payer: Self-pay

## 2020-11-25 NOTE — Telephone Encounter (Signed)
Received referral on pt. Called pt 8/17 & 9/15. Left VM. No return call. Closed referral

## 2021-01-25 ENCOUNTER — Other Ambulatory Visit: Payer: Self-pay | Admitting: Family Medicine

## 2021-02-11 IMAGING — CT CT ANGIO CHEST
2 of 6 series · 18 of 46 positions shown · IV contrast (isovue)
Comparison: CT 11/18/2018, chest x-ray 12/28/2018

CLINICAL DATA: Shortness of breath

EXAM:
CT ANGIOGRAPHY CHEST WITH CONTRAST
TECHNIQUE: Multidetector CT imaging of the chest was performed using the
standard protocol during bolus administration of intravenous
contrast. Multiplanar CT image reconstructions and MIPs were
obtained to evaluate the vascular anatomy.
CONTRAST:  100 mL Isovue 370 intravenous

[Series 5: pe axial thins · axial · 0.78mm/px · z∈[+1013,+1299]mm · 15 of 314 slices shown]
[im 14/314  lung]
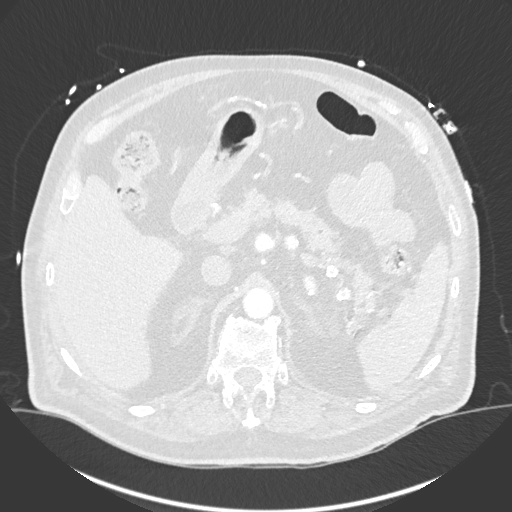
[im 41/314  soft-tissue]
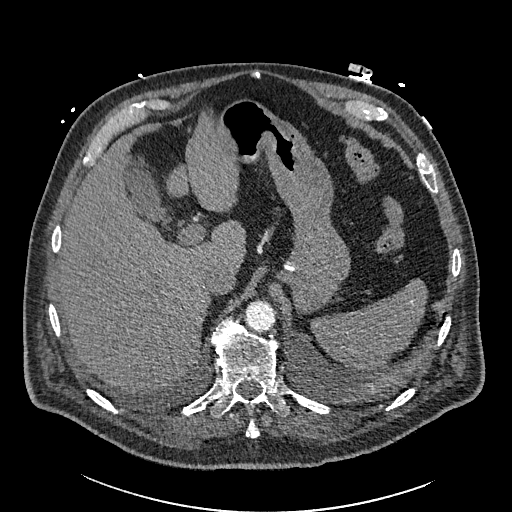
[im 55/314  lung]
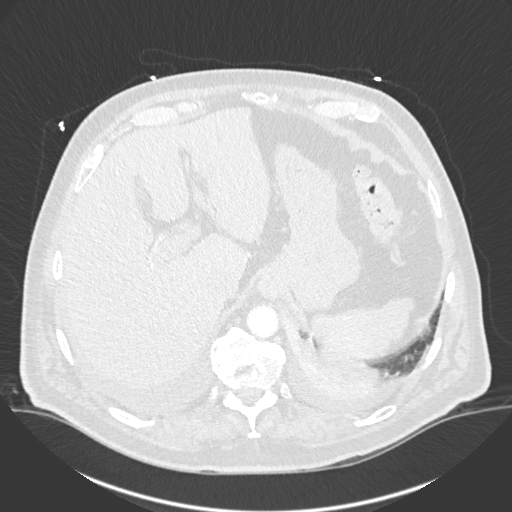
[im 82/314  soft-tissue]
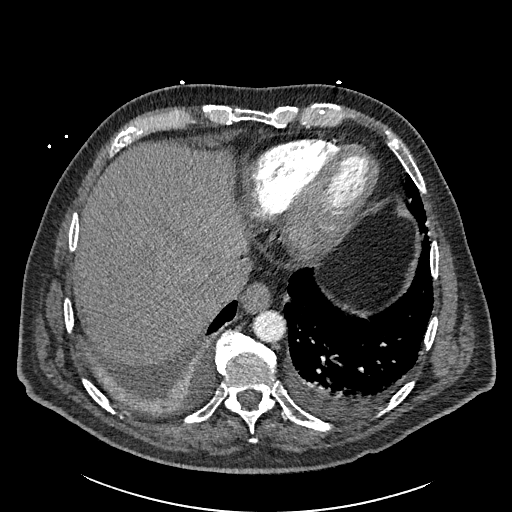
[im 96/314  lung]
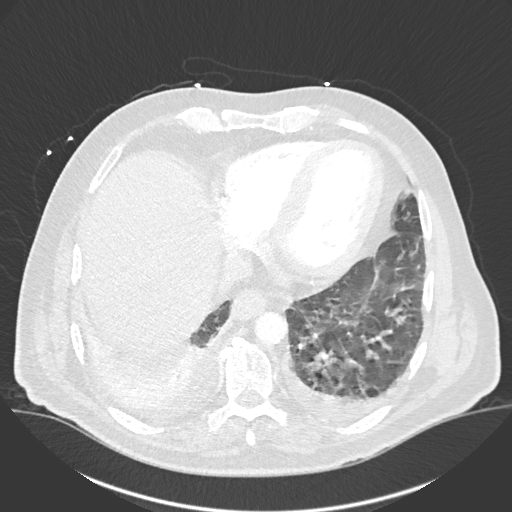
[im 123/314  soft-tissue]
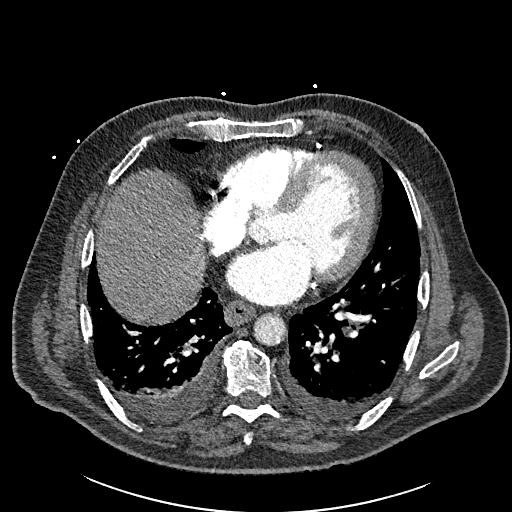
[im 137/314  lung]
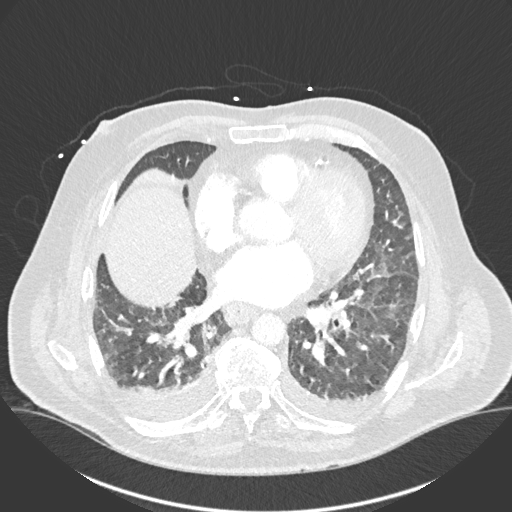
[im 164/314  soft-tissue]
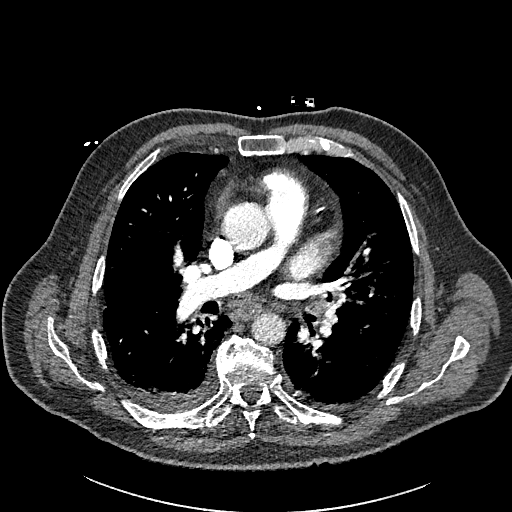
[im 177/314  lung]
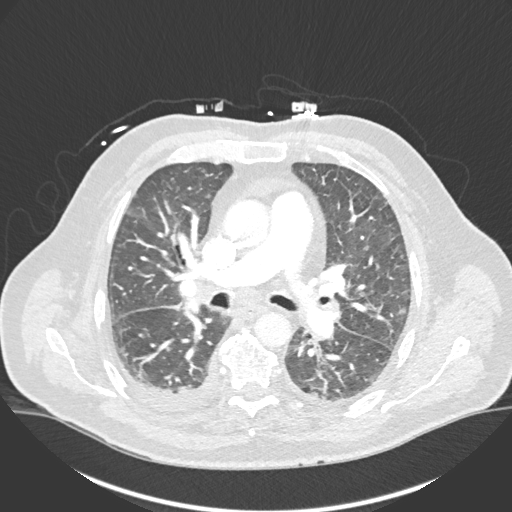
[im 191/314  soft-tissue]
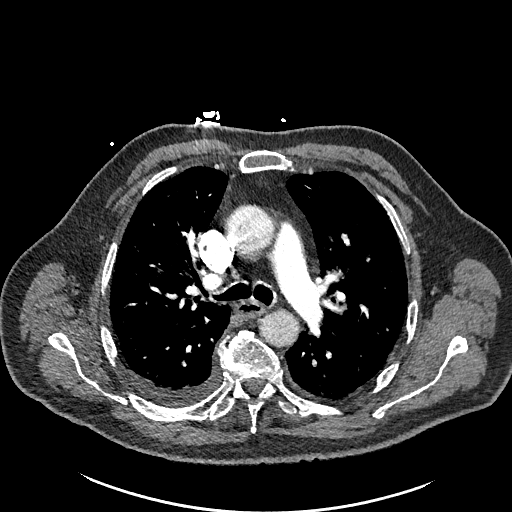
[im 218/314  lung]
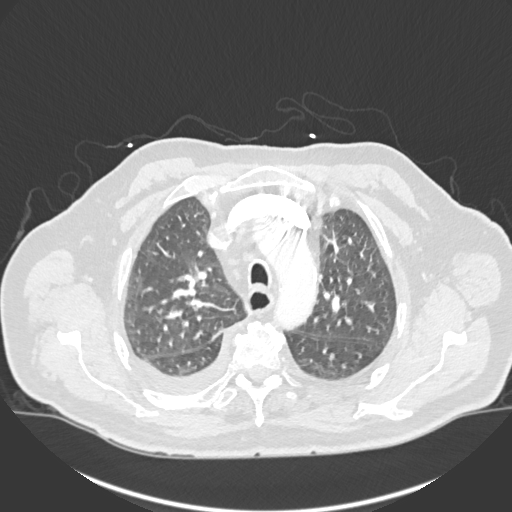
[im 232/314  soft-tissue]
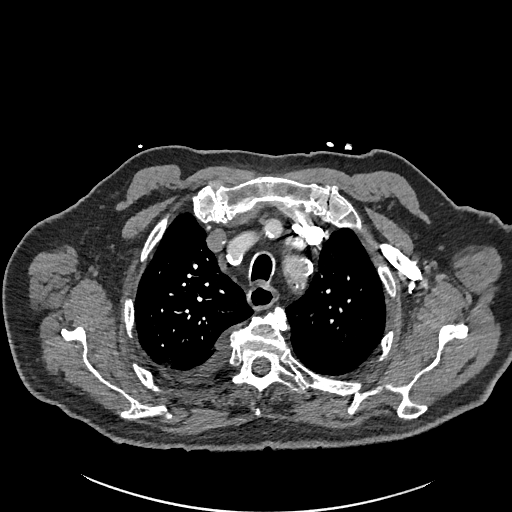
[im 259/314  lung]
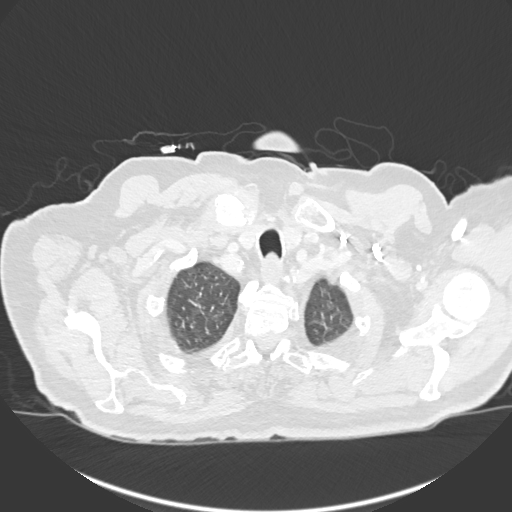
[im 273/314  soft-tissue]
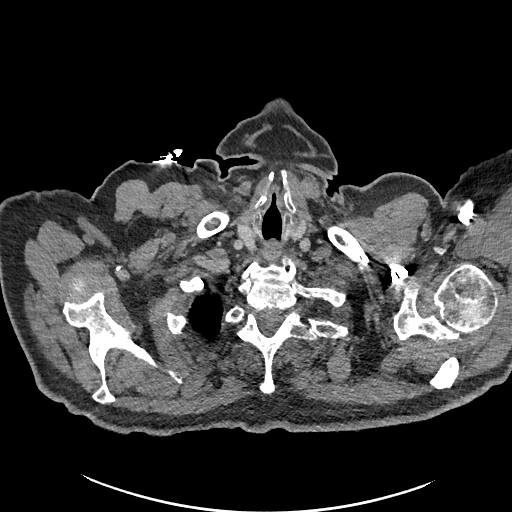
[im 300/314  lung]
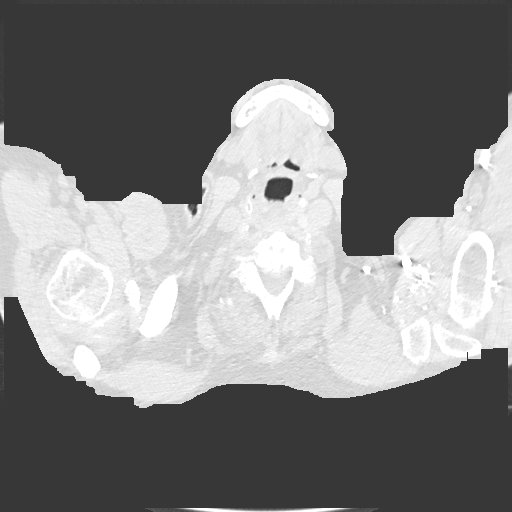

[Series 7: cor soft · coronal · 0.63mm/px · 3 of 179 slices shown]
[im 45/179  soft-tissue]
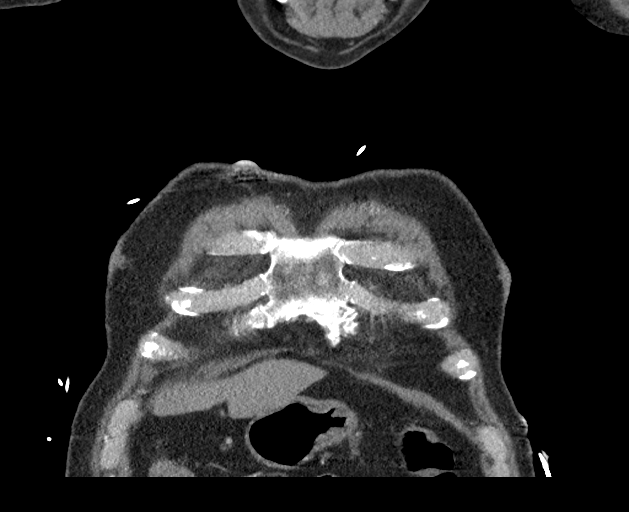
[im 90/179  soft-tissue]
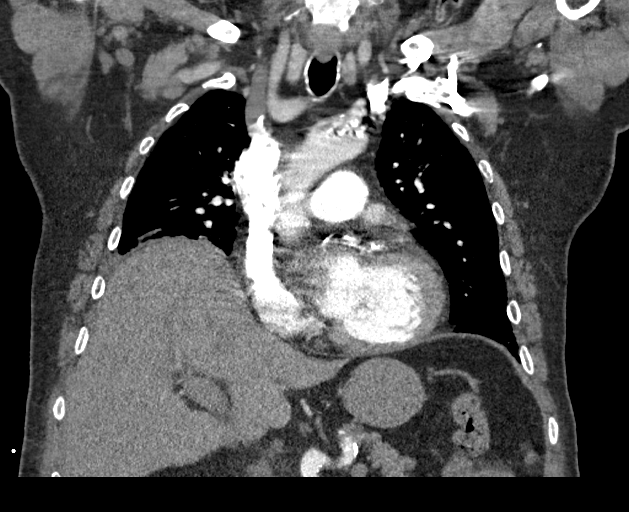
[im 134/179  soft-tissue]
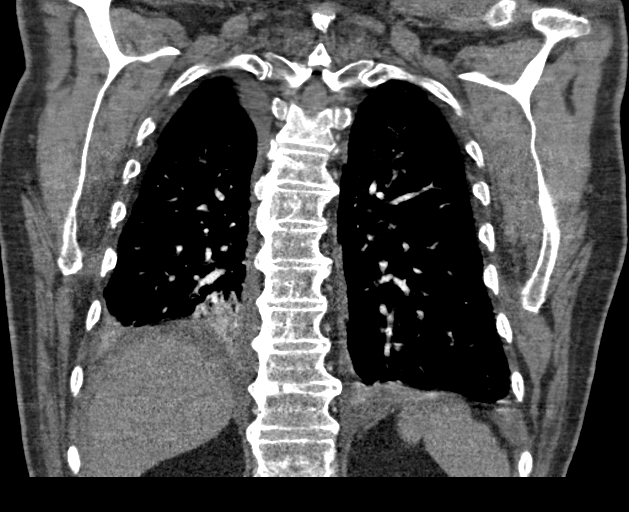

[18 of 46 positions shown; findings below may reference images not displayed]

FINDINGS: Cardiovascular: Satisfactory opacification of the pulmonary arteries
to the segmental level. No evidence of pulmonary embolism.
Nonaneurysmal aorta. Moderate aortic atherosclerosis. Coronary
vascular calcification. Mild cardiomegaly. No pericardial effusion.

Mediastinum/Nodes: Midline trachea. No thyroid mass. Subcentimeter
mediastinal lymph nodes. Mild circumferential wall thickening of the
distal esophagus.

Lungs/Pleura: New small bilateral pleural effusions. Diffuse
bilateral septal thickening. Partial resolution of previously noted
primarily peripheral ground-glass densities. No new ground-glass
abnormality. Partial atelectasis at both lower lobes.

Upper Abdomen: No acute abnormality.

Musculoskeletal: No chest wall abnormality. No acute or significant
osseous findings.

Review of the MIP images confirms the above findings.
IMPRESSION: 1. Negative for acute pulmonary embolus.
2. Cardiomegaly with interim development of small bilateral pleural
effusions. Diffuse bilateral septal thickening suggesting mild
pulmonary edema.
3. Improved appearance of previously noted peripheral ground-glass
densities with mild residual density in the upper lobes. Partial
atelectasis at the lower lobes.

Aortic Atherosclerosis ([id number].L).

## 2021-02-16 ENCOUNTER — Other Ambulatory Visit: Payer: Self-pay | Admitting: Student in an Organized Health Care Education/Training Program

## 2021-02-16 DIAGNOSIS — G894 Chronic pain syndrome: Secondary | ICD-10-CM

## 2021-02-16 DIAGNOSIS — G8929 Other chronic pain: Secondary | ICD-10-CM

## 2021-02-16 DIAGNOSIS — M5416 Radiculopathy, lumbar region: Secondary | ICD-10-CM

## 2021-03-09 ENCOUNTER — Encounter: Payer: Medicare Other | Admitting: Student in an Organized Health Care Education/Training Program

## 2021-03-17 ENCOUNTER — Other Ambulatory Visit: Payer: Self-pay

## 2021-03-17 ENCOUNTER — Encounter: Payer: Self-pay | Admitting: Student in an Organized Health Care Education/Training Program

## 2021-03-17 ENCOUNTER — Ambulatory Visit
Payer: Medicare Other | Attending: Student in an Organized Health Care Education/Training Program | Admitting: Student in an Organized Health Care Education/Training Program

## 2021-03-17 VITALS — BP 112/79 | HR 57 | Temp 97.0°F | Resp 20 | Ht 70.0 in | Wt 240.0 lb

## 2021-03-17 DIAGNOSIS — Z981 Arthrodesis status: Secondary | ICD-10-CM | POA: Diagnosis not present

## 2021-03-17 DIAGNOSIS — M25552 Pain in left hip: Secondary | ICD-10-CM | POA: Diagnosis not present

## 2021-03-17 DIAGNOSIS — G894 Chronic pain syndrome: Secondary | ICD-10-CM | POA: Insufficient documentation

## 2021-03-17 DIAGNOSIS — G8929 Other chronic pain: Secondary | ICD-10-CM | POA: Insufficient documentation

## 2021-03-17 DIAGNOSIS — M5416 Radiculopathy, lumbar region: Secondary | ICD-10-CM | POA: Insufficient documentation

## 2021-03-17 DIAGNOSIS — M25512 Pain in left shoulder: Secondary | ICD-10-CM | POA: Insufficient documentation

## 2021-03-17 DIAGNOSIS — M48062 Spinal stenosis, lumbar region with neurogenic claudication: Secondary | ICD-10-CM | POA: Diagnosis not present

## 2021-03-17 MED ORDER — HYDROCODONE-ACETAMINOPHEN 5-325 MG PO TABS: 1.0000 | ORAL_TABLET | Freq: Three times a day (TID) | ORAL | 0 refills | Status: AC | PRN

## 2021-03-17 NOTE — Progress Notes (Signed)
Nursing Pain Medication Assessment:  Safety precautions to be maintained throughout the outpatient stay will include: orient to surroundings, keep bed in low position, maintain call bell within reach at all times, provide assistance with transfer out of bed and ambulation.  Medication Inspection Compliance: Michael Cobb did not comply with our request to bring his pills to be counted. He was reminded that bringing the medication bottles, even when empty, is a requirement.  Medication: None brought in. Pill/Patch Count: None available to be counted. Bottle Appearance: No container available. Did not bring bottle(s) to appointment. Filled Date: N/A Last Medication intake:  Ran out of medicine more than 48 hours ago

## 2021-03-17 NOTE — Progress Notes (Signed)
PROVIDER NOTE: Information contained herein reflects review and annotations entered in association with encounter. Interpretation of such information and data should be left to medically-trained personnel. Information provided to patient can be located elsewhere in the medical record under "Patient Instructions". Document created using STT-dictation technology, any transcriptional errors that may result from process are unintentional.    Patient: Michael Cobb  Service Category: E/M  Provider: Gillis Santa, MD  DOB: Oct 07, 1934  DOS: 03/17/2021  Specialty: Interventional Pain Management  MRN: [id number]  Setting: Ambulatory outpatient  PCP: Leonie Douglas, MD  Type: Established Patient    Referring Provider: Leonie Douglas, MD  Location: Office  Delivery: Face-to-face     HPI  Mr. TOM MACPHERSON, a 87 y.o. year old male, is here today because of his Chronic radicular lumbar pain [M54.16, G89.29]. Mr. Dearden primary complain today is Neck Pain and Shoulder Pain (bilateral)  Last encounter: My last encounter with him was on 11/17/20  Pertinent problems: Mr. Bolds has DDD (degenerative disc disease), cervical; CKD (chronic kidney disease), stage III ([place]); Chronic left shoulder pain; Lumbar radiculopathy; Lumbar facet arthropathy; Spinal stenosis, lumbar region, with neurogenic claudication; History of lumbar fusion; Chronic radicular lumbar pain; Cervical facet joint syndrome; S/P cervical spinal fusion; Pain management contract signed; and Primary osteoarthritis of left shoulder on their pertinent problem list. Pain Assessment: Severity of Chronic pain is reported as a 8 /10. Location: Neck Upper/bilateral shoulders. Onset: More than a month ago. Quality: Aching, Constant (only when lying down). Timing: Intermittent. Modifying factor(s): sitting up, not lying down, medications. Vitals:  height is '5\' 10"'  (1.778 m) and weight is 240 lb (108.9 kg). His temporal temperature is 97 F (36.1 C)  (abnormal). His blood pressure is 112/79 and his pulse is 57 (abnormal). His respiration is 20 and oxygen saturation is 98%.   Reason for encounter: medication management.    Patient had MI (02/27/21) LHC: refractory angina. 95% mid-LAD stenosis and 99% distal LAD stenosis TIMI 2 flow 12/24: NSTEMI with PCI x2 to mid and distal LAD Follows up with cardiology and overall doing well from cardiac standpoint Presents today for med refill for chronic pain related to cervical DDD and bilateral shoulder OA.   Pharmacotherapy Assessment  Analgesic:  hydrocodone 5 mg 3 times daily as needed, quantity 90/month; MME equals 15    Monitoring: [place] PMP: PDMP reviewed during this encounter.       Pharmacotherapy: No side-effects or adverse reactions reported. Compliance: No problems identified. Effectiveness: Clinically acceptable.  UDS:  Summary  Date Value Ref Range Status  05/19/2020 Note  Final    Comment:    ==================================================================== [employer] [facility] 13 (MW) ==================================================================== Test                             Result       Flag       Units  Drug Present and Declared for Prescription Verification   Hydrocodone                    487          EXPECTED   ng/mg creat   Hydromorphone                  255          EXPECTED   ng/mg creat   Dihydrocodeine  136          EXPECTED   ng/mg creat   Norhydrocodone                 519          EXPECTED   ng/mg creat    Sources of hydrocodone include scheduled prescription medications.    Hydromorphone, dihydrocodeine and norhydrocodone are expected    metabolites of hydrocodone. Hydromorphone and dihydrocodeine are    also available as scheduled prescription medications.  ==================================================================== Test                      Result    Flag   Units      Ref Range   Creatinine              69                mg/dL      >=20 ==================================================================== Declared Medications:  The flagging and interpretation on this report are based on the  following declared medications.  Unexpected results may arise from  inaccuracies in the declared medications.   **Note: The testing scope of this panel includes these medications:   Hydrocodone (Norco)   **Note: The testing scope of this panel does not include the  following reported medications:   Acetaminophen (Tylenol)  Acetaminophen (Norco)  Allopurinol (Zyloprim)  Amlodipine (Norvasc)  Aspirin  Cetirizine (Zyrtec)  Cyanocobalamin  Docusate (Colace)  Empagliflozin (Jardiance)  Escitalopram (Lexapro)  Evolocumab (Repatha)  Fluticasone (Flonase)  Furosemide (Lasix)  Gabapentin (Neurontin)  Glipizide (Glucotrol)  Iron  Isosorbide (Imdur)  Meclizine (Antivert)  Potassium (Klor-Con)  Ramipril (Altace)  Tamsulosin (Flomax)  Vitamin D2 ==================================================================== For clinical consultation, please call [phone]. ====================================================================       ROS  Constitutional: Denies any fever or chills Gastrointestinal: No reported hemesis, hematochezia, vomiting, or acute GI distress Musculoskeletal:  Shoulder pain, low back pain Neurological: No reported episodes of acute onset apraxia, aphasia, dysarthria, agnosia, amnesia, paralysis, loss of coordination, or loss of consciousness  Medication Review  HYDROcodone-acetaminophen, Omega-3 Fatty Acids, amLODipine, aspirin EC, cetirizine, cyanocobalamin, docusate sodium, ferrous sulfate, fluticasone, furosemide, gabapentin, glipiZIDE, insulin glargine, isosorbide mononitrate, meclizine, nitroGLYCERIN, potassium chloride SA, ramipril, and tamsulosin  History Review  Allergy: Mr. Vester is allergic to metformin and morphine and related. Drug: Mr. Angelino  reports no history of  drug use. Alcohol:  reports no history of alcohol use. Tobacco:  reports that he has never smoked. He has never used smokeless tobacco. Social: Mr. Ladnier  reports that he has never smoked. He has never used smokeless tobacco. He reports that he does not drink alcohol and does not use drugs. Medical:  has a past medical history of BPH (benign prostatic hyperplasia), CAD (coronary artery disease), Chronic renal insufficiency, stage 3 (moderate) ([place]), Essential hypertension, History of skin cancer, Ischemic cardiomyopathy, Mixed hyperlipidemia, Non-ST elevated myocardial infarction (non-STEMI) ([place]), Statin intolerance, and Type 2 diabetes mellitus ([place]). Surgical: Mr. Nelles  has a past surgical history that includes Appendectomy; Back surgery; Surgery of lip; and LEFT HEART CATH AND CORONARY ANGIOGRAPHY (N/A, 06/27/2017). Family: family history includes CVA in his father; Hypertension in his father.  Laboratory Chemistry Profile   Renal Lab Results  Component Value Date   BUN 31 (H) 04/27/2019   CREATININE 1.66 (H) 04/27/2019   GFRAA 44 (L) 04/27/2019   GFRNONAA 38 (L) 04/27/2019     Hepatic Lab Results  Component Value Date   AST 20  12/29/2018   ALT 18 12/29/2018   ALBUMIN 3.0 (L) 12/29/2018   ALKPHOS 69 12/29/2018     Electrolytes Lab Results  Component Value Date   NA 137 04/27/2019   K 6.3 (HH) 04/27/2019   CL 111 04/27/2019   CALCIUM 8.9 04/27/2019   MG 1.9 12/31/2018     Bone No results found for: VD25OH, [id number], [id number], [id number], 25OHVITD1, 25OHVITD2, 25OHVITD3, TESTOFREE, TESTOSTERONE   Inflammation (CRP: Acute Phase) (ESR: Chronic Phase) Lab Results  Component Value Date   CRP 2.4 (H) 11/20/2018   ESRSEDRATE 72 (H) 11/20/2018   LATICACIDVEN 1.7 09/09/2017       Note: Above Lab results reviewed.  Recent Imaging Review  ECHOCARDIOGRAM COMPLETE    ECHOCARDIOGRAM REPORT       Patient Name:   JONATHAN CORPUS Date of Exam: 04/09/2020 Medical Rec #:   [id number]        Height:       70.0 in Accession #:    [id number]       Weight:       243.0 lb Date of Birth:  [birth year]         BSA:          2.267 m Patient Age:    34 years         BP:           122/62 mmHg Patient Gender: M                HR:           75 bpm. Exam Location:  [facility]  Procedure: 2D Echo, Cardiac Doppler and Color Doppler  Indications:     I25.5 Ischemic cardiomyopathy   History:         Patient has prior history of Echocardiogram examinations, most                  recent 12/29/2018. Cardiomyopathy and CHF, CAD, CKD; Risk                  Factors:Hypertension, Diabetes, Dyslipidemia and Non-Smoker.   Sonographer:     Jeneen Montgomery RDMS, RVT, RDCS Referring Phys:  [place] Diagnosing Phys: Carlyle Dolly MD  IMPRESSIONS   1. Left ventricular ejection fraction, by estimation, is 40 to 45%. The left ventricle has mildly decreased function. The left ventricle demonstrates global hypokinesis. Left ventricular diastolic parameters are consistent with Grade I diastolic  dysfunction (impaired relaxation).  2. Right ventricular systolic function is normal. The right ventricular size is normal.  3. Left atrial size was mildly dilated.  4. The mitral valve is normal in structure. No evidence of mitral valve regurgitation. No evidence of mitral stenosis.  5. The aortic valve is tricuspid. There is mild calcification of the aortic valve. There is mild thickening of the aortic valve. Aortic valve regurgitation is not visualized. No aortic stenosis is present.  Comparison(s): Echocardiogram done 12/29/18 showed an EF of 40-45%.  FINDINGS  Left Ventricle: Left ventricular ejection fraction, by estimation, is 40 to 45%. The left ventricle has mildly decreased function. The left ventricle demonstrates global hypokinesis. The left ventricular internal cavity size was normal in size. There is  no left ventricular hypertrophy. Left ventricular diastolic parameters are  consistent with Grade I diastolic dysfunction (impaired relaxation). Normal left ventricular filling pressure.  Right Ventricle: The right ventricular size is normal. No increase in right ventricular wall thickness. Right ventricular systolic function is normal.  Left Atrium:  Left atrial size was mildly dilated.  Right Atrium: Right atrial size was normal in size.  Pericardium: The pericardium was not well visualized.  Mitral Valve: The mitral valve is normal in structure. No evidence of mitral valve regurgitation. No evidence of mitral valve stenosis.  Tricuspid Valve: The tricuspid valve is normal in structure. Tricuspid valve regurgitation is not demonstrated. No evidence of tricuspid stenosis.  Aortic Valve: The aortic valve is tricuspid. There is mild calcification of the aortic valve. There is mild thickening of the aortic valve. There is mild aortic valve annular calcification. Aortic valve regurgitation is not visualized. No aortic stenosis  is present. Aortic valve mean gradient measures 5.3 mmHg. Aortic valve peak gradient measures 10.4 mmHg. Aortic valve area, by VTI measures 1.66 cm.  Pulmonic Valve: The pulmonic valve was not well visualized. Pulmonic valve regurgitation is not visualized. No evidence of pulmonic stenosis.  Aorta: The aortic root is normal in size and structure.  IAS/Shunts: The interatrial septum was not well visualized.    LEFT VENTRICLE PLAX 2D LVIDd:         5.40 cm      Diastology LVIDs:         4.17 cm      LV e' medial:    4.53 cm/s LV PW:         1.00 cm      LV E/e' medial:  14.9 LV IVS:        0.98 cm      LV e' lateral:   9.41 cm/s LVOT diam:     1.80 cm      LV E/e' lateral: 7.2 LV SV:         58 LV SV Index:   26 LVOT Area:     2.54 cm   LV Volumes (MOD) LV vol d, MOD A2C: 105.5 ml LV vol d, MOD A4C: 99.5 ml LV vol s, MOD A2C: 59.2 ml LV vol s, MOD A4C: 58.9 ml LV SV MOD A2C:     46.3 ml LV SV MOD A4C:     99.5 ml LV SV MOD BP:       43.9 ml  RIGHT VENTRICLE RV S prime:     11.00 cm/s TAPSE (M-mode): 1.6 cm  LEFT ATRIUM             Index       RIGHT ATRIUM           Index LA diam:        3.70 cm 1.63 cm/m  RA Area:     15.10 cm LA Vol (A2C):   64.6 ml 28.49 ml/m RA Volume:   37.10 ml  16.36 ml/m LA Vol (A4C):   75.1 ml 33.12 ml/m LA Biplane Vol: 69.8 ml 30.78 ml/m  AORTIC VALVE AV Area (Vmax):    1.72 cm AV Area (Vmean):   1.66 cm AV Area (VTI):     1.66 cm AV Vmax:           161.53 cm/s AV Vmean:          109.594 cm/s AV VTI:            0.350 m AV Peak Grad:      10.4 mmHg AV Mean Grad:      5.3 mmHg LVOT Vmax:         109.00 cm/s LVOT Vmean:        71.700 cm/s LVOT VTI:  0.229 m LVOT/AV VTI ratio: 0.65   AORTA Ao Root diam: 3.60 cm  MITRAL VALVE                TRICUSPID VALVE MV Area (PHT):              TR Peak grad:   5.2 mmHg MV Decel Time: 351 msec     TR Vmax:        114.00 cm/s MR Peak grad: 22.7 mmHg MR Vmax:      238.00 cm/s   SHUNTS MV E velocity: 67.30 cm/s   Systemic VTI:  0.23 m MV A velocity: 102.00 cm/s  Systemic Diam: 1.80 cm MV E/A ratio:  0.66  Carlyle Dolly MD Electronically signed by Carlyle Dolly MD Signature Date/Time: 04/09/2020/3:33:41 PM      Final    Note: Reviewed        Physical Exam  General appearance: Well nourished, well developed, and well hydrated. In no apparent acute distress Mental status: Alert, oriented x 3 (person, place, & time)       Respiratory: No evidence of acute respiratory distress Eyes: PERLA Vitals: BP 112/79    Pulse (!) 57    Temp (!) 97 F (36.1 C) (Temporal)    Resp 20    Ht '5\' 10"'  (1.778 m)    Wt 240 lb (108.9 kg)    SpO2 98%    BMI 34.44 kg/m  BMI: Estimated body mass index is 34.44 kg/m as calculated from the following:   Height as of this encounter: '5\' 10"'  (1.778 m).   Weight as of this encounter: 240 lb (108.9 kg). Ideal: Ideal body weight: 73 kg (160 lb 15 oz) Adjusted ideal body weight: 87.3 kg (192 lb 9  oz)    Lumbar Exam  Skin & Axial Inspection: Well healed scar from previous spine surgery detected Alignment: Symmetrical Functional ROM: Pain restricted ROM affecting both sides Stability: No instability detected Muscle Tone/Strength: Functionally intact. No obvious neuro-muscular anomalies detected. Sensory (Neurological): Dermatomal pain pattern Palpation: No palpable anomalies       Provocative Tests: Hyperextension/rotation test: deferred today       Lumbar quadrant test (Kemp's test): (+) bilateral for foraminal stenosis Lateral bending test: (+) ipsilateral radicular pain, bilaterally. Positive for bilateral foraminal stenosis  Gait & Posture Assessment  Ambulation: Patient ambulates using a cane Gait: Antalgic Posture: Difficulty standing up straight, due to pain    Lower Extremity Exam      Side: Right lower extremity   Side: Left lower extremity  Stability: No instability observed           Stability: No instability observed          Skin & Extremity Inspection: Skin color, temperature, and hair growth are WNL. No peripheral edema or cyanosis. No masses, redness, swelling, asymmetry, or associated skin lesions. No contractures.   Skin & Extremity Inspection: Skin color, temperature, and hair growth are WNL. No peripheral edema or cyanosis. No masses, redness, swelling, asymmetry, or associated skin lesions. No contractures.  Functional ROM: Pain restricted ROM for all joints of the lower extremity           Functional ROM: Pain restricted ROM for all joints of the lower extremity          Muscle Tone/Strength: Functionally intact. No obvious neuro-muscular anomalies detected.   Muscle Tone/Strength: Functionally intact. No obvious neuro-muscular anomalies detected.  Sensory (Neurological): Dermatomal pain pattern  Sensory (Neurological): Dermatomal pain pattern        DTR: Patellar: deferred today Achilles: deferred today Plantar: deferred today   DTR: Patellar:  deferred today Achilles: deferred today Plantar: deferred today  Palpation: No palpable anomalies   Palpation: No palpable anomalies    Assessment   Status Diagnosis  Controlled Controlled Controlled 1. Chronic radicular lumbar pain   2. Lumbar radiculopathy   3. Arthralgia of left hip   4. Left hip pain   5. Spinal stenosis, lumbar region, with neurogenic claudication   6. History of lumbar fusion   7. Chronic left shoulder pain   8. Chronic pain syndrome         Plan of Care  Mr. ALEXZAVIER GIRARDIN has a current medication list which includes the following long-term medication(s): amlodipine, cetirizine, ferrous sulfate, fluticasone, furosemide, gabapentin, glipizide, insulin glargine, isosorbide mononitrate, potassium chloride sa, hydrocodone-acetaminophen, [START ON 04/16/2021] hydrocodone-acetaminophen, [START ON 05/16/2021] hydrocodone-acetaminophen, and ramipril.  Pharmacotherapy (Medications Ordered): Meds ordered this encounter  Medications   HYDROcodone-acetaminophen (NORCO/VICODIN) 5-325 MG tablet    Sig: Take 1 tablet by mouth every 8 (eight) hours as needed for severe pain. Must last 30 days.    Dispense:  90 tablet    Refill:  0    Chronic Pain. (STOP Act - Not applicable). Fill one day early if closed on scheduled refill date.   HYDROcodone-acetaminophen (NORCO/VICODIN) 5-325 MG tablet    Sig: Take 1 tablet by mouth every 8 (eight) hours as needed for severe pain. Must last 30 days.    Dispense:  90 tablet    Refill:  0    Chronic Pain. (STOP Act - Not applicable). Fill one day early if closed on scheduled refill date.   HYDROcodone-acetaminophen (NORCO/VICODIN) 5-325 MG tablet    Sig: Take 1 tablet by mouth every 8 (eight) hours as needed for severe pain. Must last 30 days.    Dispense:  90 tablet    Refill:  0    Chronic Pain. (STOP Act - Not applicable). Fill one day early if closed on scheduled refill date.   Continue Gabapentin as prescribed UDS  up-to-date and appropriate.  PMP checked and appropriate.  Follow-up plan:   Return in about 3 months (around 06/15/2021) for Medication Management, in person.      Interventional management options:  Considering:   S/p L2/3 ESI 08/26/19, 01/06/20: Significant pain relief, repeat as needed Status post left intra-articular hip steroid injection 02/17/2020  Recent Visits No visits were found meeting these conditions. Showing recent visits within past 90 days and meeting all other requirements Today's Visits Date Type Provider Dept  03/17/21 Office Visit Gillis Santa, MD [facility]  Showing today's visits and meeting all other requirements Future Appointments No visits were found meeting these conditions. Showing future appointments within next 90 days and meeting all other requirements  I discussed the assessment and treatment plan with the patient. The patient was provided an opportunity to ask questions and all were answered. The patient agreed with the plan and demonstrated an understanding of the instructions.  Patient advised to call back or seek an in-person evaluation if the symptoms or condition worsens.  Duration of encounter:30 minutes.  Note by: Gillis Santa, MD Date: 03/17/2021; Time: 3:11 PM

## 2021-03-23 ENCOUNTER — Encounter: Payer: Medicare Other | Admitting: Student in an Organized Health Care Education/Training Program

## 2021-03-26 ENCOUNTER — Ambulatory Visit: Payer: Medicare Other | Admitting: Student

## 2021-03-26 ENCOUNTER — Telehealth: Payer: Self-pay | Admitting: Student

## 2021-03-26 NOTE — Telephone Encounter (Signed)
Pt had a appt on 03/26/21 and canceled day of because he wasn't aware of the appt. I called him to remind him of the appt. I had to lvm as pt did not answer. His appt was rescheduled for March, Bernerd Pho asked me to use a 7 day hold spot to get him in sooner because of his recent NSTEMI new appt is 04/22/2021. I called to make him aware and, got vm once again. Made [place] aware. Going to mail pt a appointment letter in hopes he will make it to his appt on 2/16.

## 2021-03-26 NOTE — Progress Notes (Deleted)
Cardiology Office Note    Date:  03/26/2021   ID:  Joncarlo, Friberg 08-03-1934, MRN [id number]  PCP:  Leonie Douglas, MD  Cardiologist: Rozann Lesches, MD    No chief complaint on file.   History of Present Illness:    Michael Cobb is a 87 y.o. male CAD (s/p cath in 06/2017 showing multivessel CAD and poor targets for revascularization), HFrEF (EF 40-45% in 12/2018 and 04/2020), history of symptomatic bradycardia, HTN, HLD and Type 2 DM who presents to the office today for hospital follow-up.  He most recently presented to [facility] on 02/25/2021 for evaluation of worsening chest pain and shortness of breath.  He was found to have an NSTEMI with troponin values peaking at 12,028. He was therefore transferred to [facility] for further management. Repeat echocardiogram showed his EF was reduced at 30 to 35% with anterior apical hypokinesis and mild MR.  He initially required Lasix for diuresis and underwent cardiac catheterization on 02/26/2021 which showed diffuse CAD with 95% mid LAD stenosis, 99% distal LAD stenosis, 70% mid RCA stenosis, 70% distal RCA stenosis, occluded mid LCx, 40% distal left main stenosis, 80% ostial LAD stenosis, occluded D1 and 6% OM1 stenosis.  He underwent successful DES placement to the mid LAD and DES placement to the distal LAD.  He was started on ASA and Plavix along with Zetia 10 mg daily, Toprol-XL 25 mg daily, Entresto 24-26 mg twice daily and Spironolactone 25 mg daily.  Past Medical History:  Diagnosis Date   BPH (benign prostatic hyperplasia)    CAD (coronary artery disease)    Multivessel CAD, heavily calcified   Chronic renal insufficiency, stage 3 (moderate) (HCC)    Essential hypertension    History of skin cancer    Ischemic cardiomyopathy    Mixed hyperlipidemia    Non-ST elevated myocardial infarction (non-STEMI) (HCC)    Statin intolerance    Type 2 diabetes mellitus ([place])     Past Surgical History:  Procedure  Laterality Date   APPENDECTOMY     BACK SURGERY     LEFT HEART CATH AND CORONARY ANGIOGRAPHY N/A 06/27/2017   Procedure: LEFT HEART CATH AND CORONARY ANGIOGRAPHY;  Surgeon: Burnell Blanks, MD;  Location: [facility];  Service: Cardiovascular;  Laterality: N/A;   SURGERY OF LIP      Current Medications: Outpatient Medications Prior to Visit  Medication Sig Dispense Refill   amLODipine (NORVASC) 10 MG tablet TAKE ONE TABLET BY MOUTH DAILY 90 tablet 3   aspirin EC 81 MG tablet Take 81 mg by mouth at bedtime.      cetirizine (ZYRTEC) 10 MG tablet Take 10 mg by mouth at bedtime.      docusate sodium (COLACE) 100 MG capsule Take 100 mg by mouth 2 (two) times daily.     ferrous sulfate 325 (65 FE) MG tablet Take 1 tablet (325 mg total) by mouth 2 (two) times daily with a meal. 30 tablet 3   fluticasone (FLONASE) 50 MCG/ACT nasal spray Place 1 spray into both nostrils daily as needed for allergies or rhinitis.      furosemide (LASIX) 40 MG tablet Take 1 tablet (40 mg total) by mouth daily. 30 tablet 2   gabapentin (NEURONTIN) 100 MG capsule Take 1-2 capsules (100-200 mg total) by mouth at bedtime. 60 capsule 2   glipiZIDE (GLUCOTROL) 10 MG tablet Take 1 tablet by mouth 2 (two) times daily.     HYDROcodone-acetaminophen (NORCO/VICODIN) 5-325  MG tablet Take 1 tablet by mouth every 8 (eight) hours as needed for severe pain. Must last 30 days. 90 tablet 0   [START ON 04/16/2021] HYDROcodone-acetaminophen (NORCO/VICODIN) 5-325 MG tablet Take 1 tablet by mouth every 8 (eight) hours as needed for severe pain. Must last 30 days. 90 tablet 0   [START ON 05/16/2021] HYDROcodone-acetaminophen (NORCO/VICODIN) 5-325 MG tablet Take 1 tablet by mouth every 8 (eight) hours as needed for severe pain. Must last 30 days. 90 tablet 0   insulin glargine (LANTUS) 100 UNIT/ML injection Inject 10 Units into the skin daily.     isosorbide mononitrate (IMDUR) 30 MG 24 hr tablet TAKE ONE TABLET BY MOUTH DAILY 90  tablet 2   meclizine (ANTIVERT) 25 MG tablet Take 25 mg by mouth 3 (three) times daily as needed for dizziness.     nitroGLYCERIN (NITROSTAT) 0.4 MG SL tablet Place 0.4 mg under the tongue every 5 (five) minutes as needed for chest pain.     Omega-3 Fatty Acids (FISH OIL PO) Take 1 tablet by mouth 2 (two) times daily.      potassium chloride SA (KLOR-CON) 20 MEQ tablet Take 1 tablet (20 mEq total) by mouth daily. 90 tablet 1   ramipril (ALTACE) 10 MG capsule Take 10 mg by mouth 2 (two) times daily. (Patient not taking: Reported on 03/17/2021)  0   tamsulosin (FLOMAX) 0.4 MG CAPS capsule Take 0.4 mg by mouth at bedtime.      vitamin B-12 1000 MCG tablet Take 1 tablet (1,000 mcg total) by mouth daily. 30 tablet 1   No facility-administered medications prior to visit.     Allergies:   Metformin and Morphine and related   Social History   Socioeconomic History   Marital status: Divorced    Spouse name: Not on file   Number of children: Not on file   Years of education: Not on file   Highest education level: Not on file  Occupational History   Not on file  Tobacco Use   Smoking status: Never   Smokeless tobacco: Never  Vaping Use   Vaping Use: Never used  Substance and Sexual Activity   Alcohol use: Never   Drug use: Never   Sexual activity: Not on file  Other Topics Concern   Not on file  Social History Narrative   Not on file   Social Determinants of Health   Financial Resource Strain: Not on file  Food Insecurity: Not on file  Transportation Needs: Not on file  Physical Activity: Not on file  Stress: Not on file  Social Connections: Not on file     Family History:  The patient's ***family history includes CVA in his father; Hypertension in his father.   Review of Systems:    Please see the history of present illness.     All other systems reviewed and are otherwise negative except as noted above.   Physical Exam:    VS:  There were no vitals taken for this  visit.   General: Well developed, well nourished,male appearing in no acute distress. Head: Normocephalic, atraumatic. Neck: No carotid bruits. JVD not elevated.  Lungs: Respirations regular and unlabored, without wheezes or rales.  Heart: ***Regular rate and rhythm. No S3 or S4.  No murmur, no rubs, or gallops appreciated. Abdomen: Appears non-distended. No obvious abdominal masses. Msk:  Strength and tone appear normal for age. No obvious joint deformities or effusions. Extremities: No clubbing or cyanosis. No edema.  Distal pedal  pulses are 2+ bilaterally. Neuro: Alert and oriented X 3. Moves all extremities spontaneously. No focal deficits noted. Psych:  Responds to questions appropriately with a normal affect. Skin: No rashes or lesions noted  Wt Readings from Last 3 Encounters:  03/17/21 240 lb (108.9 kg)  11/17/20 240 lb (108.9 kg)  10/26/20 240 lb 12.8 oz (109.2 kg)        Studies/Labs Reviewed:   EKG:  EKG is*** ordered today.  The ekg ordered today demonstrates ***  Recent Labs: No results found for requested labs within last 8760 hours.   Lipid Panel    Component Value Date/Time   CHOL 305 (H) 03/25/2019 0904   TRIG 205 (H) 03/25/2019 0904   HDL 37 (L) 03/25/2019 0904   CHOLHDL 8.2 03/25/2019 0904   VLDL 41 (H) 03/25/2019 0904   LDLCALC 227 (H) 03/25/2019 0904    Additional studies/ records that were reviewed today include:   Echocardiogram: 04/2020 IMPRESSIONS     1. Left ventricular ejection fraction, by estimation, is 40 to 45%. The  left ventricle has mildly decreased function. The left ventricle  demonstrates global hypokinesis. Left ventricular diastolic parameters are  consistent with Grade I diastolic  dysfunction (impaired relaxation).   2. Right ventricular systolic function is normal. The right ventricular  size is normal.   3. Left atrial size was mildly dilated.   4. The mitral valve is normal in structure. No evidence of mitral valve   regurgitation. No evidence of mitral stenosis.   5. The aortic valve is tricuspid. There is mild calcification of the  aortic valve. There is mild thickening of the aortic valve. Aortic valve  regurgitation is not visualized. No aortic stenosis is present.   Comparison(s): Echocardiogram done 12/29/18 showed an EF of 40-45%.   Echocardiogram: 02/2021 Summary    1. The left ventricular systolic function is severely decreased, LVEF is  visually estimated at 30-35%.    2. There is anteroapical hypokinesis.    3. There is mild mitral valve regurgitation.    4. The right ventricle is not well visualized but probably normal in size  and function.    5. Technically difficult study.    6. An ultrasound enhancing agent was used to improve the visualization of  the left ventricular cavity and endocardial borders.    Cardiac Catheterization: 02/2021 Findings:  Elevated LVEDP at 29 mm Hg  Anterior wall hypokinesis with reduced LV systolic function  Diffuse coronary artery calcium  Coronary artery disease including 95% mid-LAD stenosis and 99% distal LAD  stenosis with TIMI 2 flow.  Other coronary artery disease including 70% mid-RCA stenosis, 70% distal  RCA stenosis, occluded mid-circumflex, 40% distal left main stenosis, 80%  ostial LAD stenosis, occluded D1, 60% OM1 stenosis and diffuse distal  disease  Successful PCI of mid LAD with placement of a Xience drug eluting stent  Successful PCI of distal LAD with placement of an Onyx drug eluting stent  Assessment:    No diagnosis found.   Plan:   In order of problems listed above:  ***    Shared Decision Making/Informed Consent:   {Are you ordering a CV Procedure (e.g. stress test, cath, DCCV, TEE, etc)?   Press F2        :[id number]}    Medication Adjustments/Labs and Tests Ordered: Current medicines are reviewed at length with the patient today.  Concerns regarding medicines are outlined above.  Medication changes, Labs and  Tests ordered today are listed in the  Patient Instructions below. There are no Patient Instructions on file for this visit.   Signed, Erma Heritage, PA-C  03/26/2021 12:07 PM    [place] [address] [address] [place], [place] [address] Phone: [phone] Fax: [phone]

## 2021-03-31 NOTE — Progress Notes (Signed)
Cardiology Office Note    Date:  04/01/2021   ID:  Skip, Litke [birth year], MRN [id number]  PCP:  Leonie Douglas, MD  Cardiologist: Rozann Lesches, MD    Chief Complaint  Patient presents with   Hospitalization Follow-up    History of Present Illness:    Michael Cobb is a 87 y.o. male with past medical history of CAD (s/p cath in 06/2017 showing multivessel CAD and poor targets for revascularization), HFrEF (EF 40-45% in 12/2018 and 04/2020), history of symptomatic bradycardia, HTN, HLD, Type 2 DM and Stage 3 CKD who presents to the office today for hospital follow-up.   He most recently presented to [facility] on 02/25/2021 for evaluation of worsening chest pain and shortness of breath. Was found to have an NSTEMI with troponin values peaking at 12,028. He was therefore transferred to [facility] for further management. Repeat echocardiogram showed his EF was reduced at 30 to 35% with anterior apical hypokinesis and mild MR. He initially required Lasix for diuresis and underwent cardiac catheterization on 02/26/2021 which showed diffuse CAD with 95% mid LAD stenosis, 99% distal LAD stenosis, 70% mid RCA stenosis, 70% distal RCA stenosis, occluded mid LCx, 40% distal left main stenosis, 80% ostial LAD stenosis, occluded D1 and 60% OM1 stenosis. He underwent successful DES placement to the mid-LAD and DES placement to the distal-LAD. He was started on ASA and Plavix along with Zetia 10 mg daily, Toprol-XL 25 mg daily, Entresto 24-26 mg twice daily and Spironolactone 25 mg daily.   In talking with the patient and his daughter today, he denies any recurrent chest pain or dyspnea on exertion since his recent hospitalization. No specific orthopnea, PND or pitting edema. He does report feeling weak at times and they bring with them today a list of blood pressure readings and his systolic readings has been in the 90's to low 100's when checked at home. He held his Entresto and  Spironolactone today, therefore BP was at 140/56 during today's visit but his heart rate is in the 50's.   Past Medical History:  Diagnosis Date   BPH (benign prostatic hyperplasia)    CAD (coronary artery disease)    a. s/p cath in 06/2017 showing multivessel CAD and poor targets for revascularization b. s/p NSTEMI in 02/2021 with DES to mid-LAD and DES to distal-LAD at [facility]   CHF (congestive heart failure) ([place])    a. EF 40-45% in 12/2018 and 04/2020 b. EF 30-35% in 02/2021   Chronic renal insufficiency, stage 3 (moderate) (HCC)    Essential hypertension    History of skin cancer    Ischemic cardiomyopathy    Mixed hyperlipidemia    Non-ST elevated myocardial infarction (non-STEMI) (HCC)    Statin intolerance    Type 2 diabetes mellitus ([place])     Past Surgical History:  Procedure Laterality Date   APPENDECTOMY     BACK SURGERY     LEFT HEART CATH AND CORONARY ANGIOGRAPHY N/A 06/27/2017   Procedure: LEFT HEART CATH AND CORONARY ANGIOGRAPHY;  Surgeon: Burnell Blanks, MD;  Location: [facility];  Service: Cardiovascular;  Laterality: N/A;   SURGERY OF LIP      Current Medications: Outpatient Medications Prior to Visit  Medication Sig Dispense Refill   aspirin EC 81 MG tablet Take 81 mg by mouth at bedtime.      cetirizine (ZYRTEC) 10 MG tablet Take 10 mg by mouth at bedtime.      clopidogrel (  PLAVIX) 75 MG tablet Take 75 mg by mouth daily.     docusate sodium (COLACE) 100 MG capsule Take 100 mg by mouth 2 (two) times daily.     escitalopram (LEXAPRO) 20 MG tablet Take 20 mg by mouth daily.     ezetimibe (ZETIA) 10 MG tablet Take 10 mg by mouth daily.     fluticasone (FLONASE) 50 MCG/ACT nasal spray Place 1 spray into both nostrils daily as needed for allergies or rhinitis.      furosemide (LASIX) 40 MG tablet Take 1 tablet (40 mg total) by mouth daily. 30 tablet 2   gabapentin (NEURONTIN) 100 MG capsule Take 1-2 capsules (100-200 mg total) by mouth at bedtime. 60  capsule 2   glipiZIDE (GLUCOTROL) 10 MG tablet Take 1 tablet by mouth 2 (two) times daily.     HYDROcodone-acetaminophen (NORCO/VICODIN) 5-325 MG tablet Take 1 tablet by mouth every 8 (eight) hours as needed for severe pain. Must last 30 days. 90 tablet 0   [START ON 04/16/2021] HYDROcodone-acetaminophen (NORCO/VICODIN) 5-325 MG tablet Take 1 tablet by mouth every 8 (eight) hours as needed for severe pain. Must last 30 days. 90 tablet 0   [START ON 05/16/2021] HYDROcodone-acetaminophen (NORCO/VICODIN) 5-325 MG tablet Take 1 tablet by mouth every 8 (eight) hours as needed for severe pain. Must last 30 days. 90 tablet 0   isosorbide mononitrate (IMDUR) 30 MG 24 hr tablet TAKE ONE TABLET BY MOUTH DAILY 90 tablet 2   nitroGLYCERIN (NITROSTAT) 0.4 MG SL tablet Place 0.4 mg under the tongue every 5 (five) minutes as needed for chest pain.     Omega-3 Fatty Acids (FISH OIL PO) Take 1 tablet by mouth 2 (two) times daily.      omeprazole (PRILOSEC) 20 MG capsule Take 20 mg by mouth daily.     potassium chloride SA (KLOR-CON) 20 MEQ tablet Take 1 tablet (20 mEq total) by mouth daily. 90 tablet 1   rosuvastatin (CRESTOR) 5 MG tablet Take 5 mg by mouth daily.     sacubitril-valsartan (ENTRESTO) 24-26 MG Take 1 tablet by mouth 2 (two) times daily.     tamsulosin (FLOMAX) 0.4 MG CAPS capsule Take 0.4 mg by mouth at bedtime.      vitamin B-12 1000 MCG tablet Take 1 tablet (1,000 mcg total) by mouth daily. 30 tablet 1   amLODipine (NORVASC) 10 MG tablet TAKE ONE TABLET BY MOUTH DAILY 90 tablet 3   metoprolol succinate (TOPROL-XL) 25 MG 24 hr tablet Take 25 mg by mouth daily.     spironolactone (ALDACTONE) 25 MG tablet Take 25 mg by mouth daily.     ferrous sulfate 325 (65 FE) MG tablet Take 1 tablet (325 mg total) by mouth 2 (two) times daily with a meal. (Patient not taking: Reported on 04/01/2021) 30 tablet 3   insulin glargine (LANTUS) 100 UNIT/ML injection Inject 10 Units into the skin daily.     meclizine  (ANTIVERT) 25 MG tablet Take 25 mg by mouth 3 (three) times daily as needed for dizziness. (Patient not taking: Reported on 04/01/2021)     ramipril (ALTACE) 10 MG capsule Take 10 mg by mouth 2 (two) times daily. (Patient not taking: Reported on 03/17/2021)  0   No facility-administered medications prior to visit.     Allergies:   Metformin and Morphine and related   Social History   Socioeconomic History   Marital status: Divorced    Spouse name: Not on file   Number of children:  Not on file   Years of education: Not on file   Highest education level: Not on file  Occupational History   Not on file  Tobacco Use   Smoking status: Never   Smokeless tobacco: Never  Vaping Use   Vaping Use: Never used  Substance and Sexual Activity   Alcohol use: Never   Drug use: Never   Sexual activity: Not on file  Other Topics Concern   Not on file  Social History Narrative   Not on file   Social Determinants of Health   Financial Resource Strain: Not on file  Food Insecurity: Not on file  Transportation Needs: Not on file  Physical Activity: Not on file  Stress: Not on file  Social Connections: Not on file     Family History:  The patient's family history includes CVA in his father; Hypertension in his father.   Review of Systems:    Please see the history of present illness.     All other systems reviewed and are otherwise negative except as noted above.   Physical Exam:    VS:  BP (!) 140/56    Pulse (!) 55    Ht 5\' 10"  (1.778 m)    Wt 231 lb (104.8 kg)    SpO2 98%    BMI 33.15 kg/m    General: Pleasant elderly male appearing in no acute distress. Head: Normocephalic, atraumatic. Neck: No carotid bruits. JVD not elevated.  Lungs: Respirations regular and unlabored, without wheezes or rales.  Heart: Regular rate and rhythm. No S3 or S4.  No murmur, no rubs, or gallops appreciated. Abdomen: Appears non-distended. No obvious abdominal masses. Msk:  Strength and tone appear  normal for age. No obvious joint deformities or effusions. Extremities: No clubbing or cyanosis. No pitting edema.  Distal pedal pulses are 2+ bilaterally. Neuro: Alert and oriented X 3. Moves all extremities spontaneously. No focal deficits noted. Psych:  Responds to questions appropriately with a normal affect. Skin: No rashes or lesions noted  Wt Readings from Last 3 Encounters:  04/01/21 231 lb (104.8 kg)  03/17/21 240 lb (108.9 kg)  11/17/20 240 lb (108.9 kg)     Studies/Labs Reviewed:   EKG:  EKG is ordered today. The EKG ordered today demonstrates sinus bradycardia, HR 55 with possible junctional beats but difficult to assess in the setting of artifact. Inferior infarct pattern.   Recent Labs: 04/01/2021: B Natriuretic Peptide 479.0; BUN 39; Creatinine, Ser 1.95; Hemoglobin 12.8; Platelets 196; Potassium 5.7; Sodium 135   Lipid Panel    Component Value Date/Time   CHOL 305 (H) 03/25/2019 0904   TRIG 205 (H) 03/25/2019 0904   HDL 37 (L) 03/25/2019 0904   CHOLHDL 8.2 03/25/2019 0904   VLDL 41 (H) 03/25/2019 0904   LDLCALC 227 (H) 03/25/2019 0904    Additional studies/ records that were reviewed today include:   Echocardiogram: 04/2020 IMPRESSIONS     1. Left ventricular ejection fraction, by estimation, is 40 to 45%. The  left ventricle has mildly decreased function. The left ventricle  demonstrates global hypokinesis. Left ventricular diastolic parameters are  consistent with Grade I diastolic  dysfunction (impaired relaxation).   2. Right ventricular systolic function is normal. The right ventricular  size is normal.   3. Left atrial size was mildly dilated.   4. The mitral valve is normal in structure. No evidence of mitral valve  regurgitation. No evidence of mitral stenosis.   5. The aortic valve is tricuspid. There  is mild calcification of the  aortic valve. There is mild thickening of the aortic valve. Aortic valve  regurgitation is not visualized. No aortic  stenosis is present.   Comparison(s): Echocardiogram done 12/29/18 showed an EF of 40-45%.    Echocardiogram: 02/2021 Summary    1. The left ventricular systolic function is severely decreased, LVEF is  visually estimated at 30-35%.    2. There is anteroapical hypokinesis.    3. There is mild mitral valve regurgitation.    4. The right ventricle is not well visualized but probably normal in size  and function.    5. Technically difficult study.    6. An ultrasound enhancing agent was used to improve the visualization of  the left ventricular cavity and endocardial borders.     Cardiac Catheterization: 02/2021 Findings:  Elevated LVEDP at 29 mm Hg  Anterior wall hypokinesis with reduced LV systolic function  Diffuse coronary artery calcium  Coronary artery disease including 95% mid-LAD stenosis and 99% distal LAD  stenosis with TIMI 2 flow.  Other coronary artery disease including 70% mid-RCA stenosis, 70% distal  RCA stenosis, occluded mid-circumflex, 40% distal left main stenosis, 80%  ostial LAD stenosis, occluded D1, 60% OM1 stenosis and diffuse distal  disease  Successful PCI of mid LAD with placement of a Xience drug eluting stent  Successful PCI of distal LAD with placement of an Onyx drug eluting stent  Assessment:    1. Coronary artery disease involving native coronary artery of native heart without angina pectoris   2. HFrEF (heart failure with reduced ejection fraction) ([place])   3. Ischemic cardiomyopathy   4. Essential hypertension   5. Hyperlipidemia LDL goal <70   6. Stage 3 chronic kidney disease, unspecified whether stage 3a or 3b CKD ([place])      Plan:   In order of problems listed above:  1. CAD - He recently had an NSTEMI in 02/2021 with DES to the mid-LAD and DES to the distal-LAD with residual disease as outlined above. He denies any recurrent anginal symptoms since returning home.  - Continue ASA, Plavix, Zetia 10mg  daily and Crestor 5mg  daily. Will  reduce Toprol-XL from 25mg  daily to 12.5mg  daily. He has a history of symptomatic bradycardia, therefore will arrange for him to have a nurse visit in 1 week for a repeat EKG. If still bradycardiac, will likely need to discontinue Toprol-XL.   2. HFrEF/Ischemic Cardiomyopathy - His EF was at 40-45% in 12/2018 and 04/2020 but further reduced at 30-35% in 02/2021. He denies any specific orthopnea, PND or pitting edema. He was discharged on Entresto 24-26mg  BID, Lasix 40mg  daily, Imdur 30mg  daily, Toprol-XL 25mg  daily and Spironolactone 25mg  daily. Jardiance previously unaffordable. Given his hypotension at home, will reduce Toprol-XL to 12.5mg  daily and Spironolactone to 12.5mg  daily.  - Will plan for a repeat limited echo in 3 months for reassessment of his EF. Recheck BNP and BMET today.   3. HTN - His BP is at 140/56 today but he held his AM medications as his BP has been soft at home for the past week. Will reduce Toprol-XL and Spironolactone as outlined above. I have asked him to make sure he is no longer taking Amlodipine. Continue Entresto for now.   4. HLD - LDL was at 78 in 02/2021. He was previously on Repatha but this was no longer affordable last year, therefore he has continued on Crestor 5mg  daily and Zetia 10mg  daily.   5. Stage 3-4 CKD -  His creatinine was at 2.20 at the time of hospital discharge in 02/2021. Will recheck BMET today as he has not had labs since hospital discharge. Previously followed by Nephrology but no recent follow-up. Pending repeat labs, would have a low threshold to enter referral back to Nephrology.    Medication Adjustments/Labs and Tests Ordered: Current medicines are reviewed at length with the patient today.  Concerns regarding medicines are outlined above.  Medication changes, Labs and Tests ordered today are listed in the Patient Instructions below. Patient Instructions  Medication Instructions:   MAKE SURE YOU ARE NOT TAKING AMLODIPINE  (NORVASC)  REDUCE TOPROL-XL to a half tablet daily (12.5mg  daily).   REDUCE SPIRONOLACTONE to a half tablet daily (12.5mg  daily).  *If you need a refill on your cardiac medications before your next appointment, please call your pharmacy*   Lab Work:  BMET, BNP and CBC today.   If you have labs (blood work) drawn today and your tests are completely normal, you will receive your results only by: [place] (if you have MyChart) OR A paper copy in the mail If you have any lab test that is abnormal or we need to change your treatment, we will call you to review the results.   Testing/Procedures:  Limited Echocardiogram in 3 months.    Follow-Up: At [facility], you and your health needs are our priority.  As part of our continuing mission to provide you with exceptional heart care, we have created designated Provider Care Teams.  These Care Teams include your primary Cardiologist (physician) and Advanced Practice Providers (APPs -  Physician Assistants and Nurse Practitioners) who all work together to provide you with the care you need, when you need it.  We recommend signing up for the patient portal called "MyChart".  Sign up information is provided on this After Visit Summary.  MyChart is used to connect with patients for Virtual Visits (Telemedicine).  Patients are able to view lab/test results, encounter notes, upcoming appointments, etc.  Non-urgent messages can be sent to your provider as well.   To learn more about what you can do with MyChart, go to [email or web address].    Your next appointment:    Nurse Visit in [place] for EKG and BP check in 1 week.   Follow-up with Dr. Domenic Polite in 3 months after echo has been obtained.       Signed, Erma Heritage, PA-C  04/01/2021 8:21 PM    [place] [address] [place], [place] [address] Phone: [phone] Fax: [phone]

## 2021-04-01 ENCOUNTER — Other Ambulatory Visit (HOSPITAL_COMMUNITY)
Admission: RE | Admit: 2021-04-01 | Discharge: 2021-04-01 | Disposition: A | Discharge status: Home / Self Care | Payer: Medicare Other | Source: Ambulatory Visit | Attending: Student | Admitting: Student

## 2021-04-01 ENCOUNTER — Other Ambulatory Visit: Payer: Self-pay

## 2021-04-01 ENCOUNTER — Telehealth: Payer: Self-pay | Admitting: *Deleted

## 2021-04-01 ENCOUNTER — Encounter: Payer: Self-pay | Admitting: Student

## 2021-04-01 ENCOUNTER — Ambulatory Visit (INDEPENDENT_AMBULATORY_CARE_PROVIDER_SITE_OTHER): Payer: Medicare Other | Admitting: Student

## 2021-04-01 VITALS — BP 140/56 | HR 55 | Ht 70.0 in | Wt 231.0 lb

## 2021-04-01 DIAGNOSIS — I255 Ischemic cardiomyopathy: Secondary | ICD-10-CM | POA: Diagnosis present

## 2021-04-01 DIAGNOSIS — N183 Chronic kidney disease, stage 3 unspecified: Secondary | ICD-10-CM

## 2021-04-01 DIAGNOSIS — I5020 Unspecified systolic (congestive) heart failure: Secondary | ICD-10-CM

## 2021-04-01 DIAGNOSIS — I2510 Atherosclerotic heart disease of native coronary artery without angina pectoris: Secondary | ICD-10-CM

## 2021-04-01 DIAGNOSIS — I10 Essential (primary) hypertension: Secondary | ICD-10-CM | POA: Diagnosis not present

## 2021-04-01 DIAGNOSIS — E785 Hyperlipidemia, unspecified: Secondary | ICD-10-CM

## 2021-04-01 DIAGNOSIS — Z79899 Other long term (current) drug therapy: Secondary | ICD-10-CM

## 2021-04-01 LAB — BASIC METABOLIC PANEL
Anion gap: 7 (ref 5–15)
BUN: 39 mg/dL — ABNORMAL HIGH (ref 8–23)
CO2: 21 mmol/L — ABNORMAL LOW (ref 22–32)
Calcium: 9 mg/dL (ref 8.9–10.3)
Chloride: 107 mmol/L (ref 98–111)
Creatinine, Ser: 1.95 mg/dL — ABNORMAL HIGH (ref 0.61–1.24)
GFR, Estimated: 33 mL/min — ABNORMAL LOW (ref >60)
Glucose, Bld: 228 mg/dL — ABNORMAL HIGH (ref 70–99)
Potassium: 5.7 mmol/L — ABNORMAL HIGH (ref 3.5–5.1)
Sodium: 135 mmol/L (ref 135–145)

## 2021-04-01 LAB — CBC
HCT: 38 % — ABNORMAL LOW (ref 39.0–52.0)
Hemoglobin: 12.8 g/dL — ABNORMAL LOW (ref 13.0–17.0)
MCH: 32.3 pg (ref 26.0–34.0)
MCHC: 33.7 g/dL (ref 30.0–36.0)
MCV: 96 fL (ref 80.0–100.0)
Platelets: 196 10*3/uL (ref 150–400)
RBC: 3.96 MIL/uL — ABNORMAL LOW (ref 4.22–5.81)
RDW: 13.9 % (ref 11.5–15.5)
WBC: 7.1 10*3/uL (ref 4.0–10.5)
nRBC: 0 % (ref 0.0–0.2)

## 2021-04-01 LAB — BRAIN NATRIURETIC PEPTIDE: B Natriuretic Peptide: 479 pg/mL — ABNORMAL HIGH (ref 0.0–100.0)

## 2021-04-01 MED ORDER — SPIRONOLACTONE 25 MG PO TABS: 12.5000 mg | ORAL_TABLET | Freq: Every day | ORAL | 3 refills | Status: AC

## 2021-04-01 MED ORDER — METOPROLOL SUCCINATE ER 25 MG PO TB24: 12.5000 mg | ORAL_TABLET | Freq: Every day | ORAL | 3 refills | Status: DC

## 2021-04-01 NOTE — Telephone Encounter (Signed)
Pt daughter notified   

## 2021-04-01 NOTE — Patient Instructions (Signed)
Medication Instructions:   MAKE SURE YOU ARE NOT TAKING AMLODIPINE (NORVASC)  REDUCE TOPROL-XL to a half tablet daily (12.5mg  daily).   REDUCE SPIRONOLACTONE to a half tablet daily (12.5mg  daily).  *If you need a refill on your cardiac medications before your next appointment, please call your pharmacy*   Lab Work:  BMET, BNP and CBC today.   If you have labs (blood work) drawn today and your tests are completely normal, you will receive your results only by: [place] (if you have MyChart) OR A paper copy in the mail If you have any lab test that is abnormal or we need to change your treatment, we will call you to review the results.   Testing/Procedures:  Limited Echocardiogram in 3 months.    Follow-Up: At [facility], you and your health needs are our priority.  As part of our continuing mission to provide you with exceptional heart care, we have created designated Provider Care Teams.  These Care Teams include your primary Cardiologist (physician) and Advanced Practice Providers (APPs -  Physician Assistants and Nurse Practitioners) who all work together to provide you with the care you need, when you need it.  We recommend signing up for the patient portal called "MyChart".  Sign up information is provided on this After Visit Summary.  MyChart is used to connect with patients for Virtual Visits (Telemedicine).  Patients are able to view lab/test results, encounter notes, upcoming appointments, etc.  Non-urgent messages can be sent to your provider as well.   To learn more about what you can do with MyChart, go to [email or web address].    Your next appointment:    Nurse Visit in [place] for EKG and BP check in 1 week.   Follow-up with Dr. Domenic Polite in 3 months after echo has been obtained.

## 2021-04-01 NOTE — Telephone Encounter (Signed)
-----   Message from Erma Heritage, [place] sent at 04/01/2021  4:17 PM EST ----- Please let the patient know his fluid level remains elevated but has improved. Hemoglobin at 12.8 which has improved since his admission. Platelets in a normal range. Kidney function has improved since admission as his creatinine was at 2.20 at the time of hospital discharge and now at 1.95. Potassium is elevated. Please have him STOP K-dur 59mEq daily. Repeat BMET in 1 week. Needs referral back to Nephrology and would refer to [facility] at their [place] location.

## 2021-04-07 ENCOUNTER — Ambulatory Visit (INDEPENDENT_AMBULATORY_CARE_PROVIDER_SITE_OTHER): Payer: Medicare Other | Admitting: *Deleted

## 2021-04-07 ENCOUNTER — Encounter: Payer: Self-pay | Admitting: *Deleted

## 2021-04-07 ENCOUNTER — Telehealth: Payer: Self-pay | Admitting: *Deleted

## 2021-04-07 VITALS — BP 120/50 | HR 60 | Ht 70.0 in | Wt 233.2 lb

## 2021-04-07 DIAGNOSIS — R001 Bradycardia, unspecified: Secondary | ICD-10-CM | POA: Diagnosis not present

## 2021-04-07 NOTE — Progress Notes (Signed)
Presents to office for nurse visit per recent office visit with Bernerd Pho PA-He has a history of symptomatic bradycardia, therefore will arrange for him to have a nurse visit in 1 week for a repeat EKG. If still bradycardiac, will likely need to discontinue Toprol-XL. Reports taking all medications as prescribed without missing doses. Denies dizziness, chest pain or SOB at this time. Did report dizziness when walking to room.  Medications reviewed Vitals and EKG done and routed to [facility] for review.  Aware to get blood work done tomorrow at [facility]

## 2021-04-07 NOTE — Telephone Encounter (Signed)
-----   Message from Erma Heritage, [place] sent at 04/07/2021 11:42 AM EST ----- Computer read as junctional but appears to have p-waves along precordial leads with 1st degree AV Block. Please have him STOP Toprol-XL for now. Also, please make sure he has F/U scheduled after his 06/2021 echo as it does not appear this has been arranged.   Thanks,  [place]

## 2021-04-07 NOTE — Patient Instructions (Signed)
Continue same plan We will call you with any changes

## 2021-04-07 NOTE — Telephone Encounter (Signed)
Patient informed and verbalized understanding of plan. 

## 2021-04-07 NOTE — Telephone Encounter (Signed)
Patient returned call

## 2021-04-08 ENCOUNTER — Other Ambulatory Visit: Payer: Self-pay

## 2021-04-08 ENCOUNTER — Other Ambulatory Visit (HOSPITAL_COMMUNITY)
Admission: RE | Admit: 2021-04-08 | Discharge: 2021-04-08 | Disposition: A | Discharge status: Home / Self Care | Payer: Medicare Other | Source: Ambulatory Visit | Attending: Student | Admitting: Student

## 2021-04-08 DIAGNOSIS — Z79899 Other long term (current) drug therapy: Secondary | ICD-10-CM | POA: Insufficient documentation

## 2021-04-08 LAB — BASIC METABOLIC PANEL
Anion gap: 7 (ref 5–15)
BUN: 41 mg/dL — ABNORMAL HIGH (ref 8–23)
CO2: 21 mmol/L — ABNORMAL LOW (ref 22–32)
Calcium: 8.9 mg/dL (ref 8.9–10.3)
Chloride: 110 mmol/L (ref 98–111)
Creatinine, Ser: 1.81 mg/dL — ABNORMAL HIGH (ref 0.61–1.24)
GFR, Estimated: 36 mL/min — ABNORMAL LOW (ref >60)
Glucose, Bld: 128 mg/dL — ABNORMAL HIGH (ref 70–99)
Potassium: 5 mmol/L (ref 3.5–5.1)
Sodium: 138 mmol/L (ref 135–145)

## 2021-04-09 ENCOUNTER — Telehealth: Payer: Self-pay | Admitting: Student

## 2021-04-09 NOTE — Telephone Encounter (Signed)
Pt notified of lab results and the need for BMET in 1-2 months.

## 2021-04-09 NOTE — Telephone Encounter (Signed)
Patient returning call for lab results. 

## 2021-04-22 ENCOUNTER — Ambulatory Visit: Payer: Medicare Other | Admitting: Student

## 2021-04-27 ENCOUNTER — Other Ambulatory Visit: Payer: Self-pay | Admitting: Cardiology

## 2021-04-28 ENCOUNTER — Other Ambulatory Visit (HOSPITAL_COMMUNITY): Payer: Medicare Other

## 2021-05-11 ENCOUNTER — Ambulatory Visit: Payer: Medicare Other | Admitting: Physician Assistant

## 2021-06-01 ENCOUNTER — Ambulatory Visit: Payer: Medicare Other | Admitting: Urology

## 2021-06-05 ENCOUNTER — Other Ambulatory Visit: Payer: Self-pay | Admitting: Student in an Organized Health Care Education/Training Program

## 2021-06-05 DIAGNOSIS — G894 Chronic pain syndrome: Secondary | ICD-10-CM

## 2021-06-05 DIAGNOSIS — M5416 Radiculopathy, lumbar region: Secondary | ICD-10-CM

## 2021-06-05 DIAGNOSIS — G8929 Other chronic pain: Secondary | ICD-10-CM

## 2021-06-15 ENCOUNTER — Encounter: Payer: Medicare Other | Admitting: Student in an Organized Health Care Education/Training Program

## 2021-06-28 ENCOUNTER — Other Ambulatory Visit (HOSPITAL_COMMUNITY): Payer: Medicare Other

## 2021-07-05 ENCOUNTER — Ambulatory Visit: Payer: Medicare Other | Admitting: Cardiology

## 2021-07-05 DEATH — deceased
